# Patient Record
Sex: Male | Born: 1945 | Race: Black or African American | Hispanic: No | Marital: Married | Smoking: Never smoker
Health system: Southern US, Community
[De-identification: ages and names within clinical notes are randomized; demographics above are authoritative.]

## PROBLEM LIST (undated history)

## (undated) DIAGNOSIS — M75100 Unspecified rotator cuff tear or rupture of unspecified shoulder, not specified as traumatic: Secondary | ICD-10-CM

## (undated) DIAGNOSIS — Z951 Presence of aortocoronary bypass graft: Secondary | ICD-10-CM

## (undated) DIAGNOSIS — G629 Polyneuropathy, unspecified: Secondary | ICD-10-CM

## (undated) DIAGNOSIS — I209 Angina pectoris, unspecified: Secondary | ICD-10-CM

## (undated) DIAGNOSIS — D500 Iron deficiency anemia secondary to blood loss (chronic): Secondary | ICD-10-CM

## (undated) DIAGNOSIS — G4733 Obstructive sleep apnea (adult) (pediatric): Secondary | ICD-10-CM

## (undated) DIAGNOSIS — E049 Nontoxic goiter, unspecified: Secondary | ICD-10-CM

## (undated) DIAGNOSIS — I10 Essential (primary) hypertension: Secondary | ICD-10-CM

## (undated) DIAGNOSIS — I2510 Atherosclerotic heart disease of native coronary artery without angina pectoris: Secondary | ICD-10-CM

## (undated) DIAGNOSIS — K219 Gastro-esophageal reflux disease without esophagitis: Secondary | ICD-10-CM

## (undated) DIAGNOSIS — M25512 Pain in left shoulder: Secondary | ICD-10-CM

## (undated) DIAGNOSIS — Z9889 Other specified postprocedural states: Secondary | ICD-10-CM

## (undated) DIAGNOSIS — E785 Hyperlipidemia, unspecified: Secondary | ICD-10-CM

## (undated) DIAGNOSIS — I219 Acute myocardial infarction, unspecified: Secondary | ICD-10-CM

## (undated) DIAGNOSIS — N529 Male erectile dysfunction, unspecified: Secondary | ICD-10-CM

## (undated) DIAGNOSIS — M1990 Unspecified osteoarthritis, unspecified site: Secondary | ICD-10-CM

## (undated) DIAGNOSIS — H5440 Blindness, one eye, unspecified eye: Secondary | ICD-10-CM

## (undated) HISTORY — DX: Gastro-esophageal reflux disease without esophagitis: K21.9

## (undated) HISTORY — DX: Iron deficiency anemia secondary to blood loss (chronic): D50.0

## (undated) HISTORY — DX: Pain in left shoulder: M25.512

## (undated) HISTORY — DX: Male erectile dysfunction, unspecified: N52.9

## (undated) HISTORY — PX: HERNIA REPAIR: SHX51

## (undated) HISTORY — DX: Hyperlipidemia, unspecified: E78.5

## (undated) HISTORY — PX: PARTIAL HIP ARTHROPLASTY: SHX733

## (undated) HISTORY — DX: Atherosclerotic heart disease of native coronary artery without angina pectoris: I25.10

## (undated) HISTORY — DX: Nontoxic goiter, unspecified: E04.9

## (undated) HISTORY — DX: Obstructive sleep apnea (adult) (pediatric): G47.33

## (undated) HISTORY — DX: Essential (primary) hypertension: I10

## (undated) HISTORY — DX: Unspecified rotator cuff tear or rupture of unspecified shoulder, not specified as traumatic: M75.100

## (undated) HISTORY — DX: Unspecified osteoarthritis, unspecified site: M19.90

---

## 1997-01-13 DIAGNOSIS — I219 Acute myocardial infarction, unspecified: Secondary | ICD-10-CM

## 1997-01-13 HISTORY — PX: CORONARY ARTERY BYPASS GRAFT: SHX141

## 1997-01-13 HISTORY — DX: Acute myocardial infarction, unspecified: I21.9

## 2002-03-07 ENCOUNTER — Encounter: Payer: Self-pay | Admitting: Emergency Medicine

## 2002-03-07 ENCOUNTER — Inpatient Hospital Stay (HOSPITAL_COMMUNITY): Admission: EM | Admit: 2002-03-07 | Discharge: 2002-03-09 | Payer: Self-pay | Admitting: Emergency Medicine

## 2002-03-08 ENCOUNTER — Encounter: Payer: Self-pay | Admitting: Cardiology

## 2004-02-08 ENCOUNTER — Ambulatory Visit: Payer: Self-pay | Admitting: Endocrinology

## 2004-02-08 ENCOUNTER — Observation Stay (HOSPITAL_COMMUNITY): Admission: EM | Admit: 2004-02-08 | Discharge: 2004-02-09 | Payer: Self-pay | Admitting: Emergency Medicine

## 2004-02-08 ENCOUNTER — Ambulatory Visit: Payer: Self-pay | Admitting: Cardiology

## 2004-02-20 ENCOUNTER — Ambulatory Visit: Payer: Self-pay

## 2004-02-27 ENCOUNTER — Ambulatory Visit: Payer: Self-pay

## 2004-03-12 ENCOUNTER — Ambulatory Visit: Payer: Self-pay | Admitting: Cardiology

## 2004-03-13 ENCOUNTER — Ambulatory Visit (HOSPITAL_COMMUNITY): Admission: RE | Admit: 2004-03-13 | Discharge: 2004-03-13 | Payer: Self-pay | Admitting: Cardiology

## 2004-03-27 ENCOUNTER — Ambulatory Visit: Payer: Self-pay | Admitting: Endocrinology

## 2004-04-03 ENCOUNTER — Ambulatory Visit: Payer: Self-pay | Admitting: Endocrinology

## 2004-04-03 ENCOUNTER — Ambulatory Visit: Payer: Self-pay | Admitting: Critical Care Medicine

## 2004-04-16 ENCOUNTER — Other Ambulatory Visit: Admission: RE | Admit: 2004-04-16 | Discharge: 2004-04-16 | Payer: Self-pay | Admitting: Interventional Radiology

## 2004-04-16 ENCOUNTER — Encounter (INDEPENDENT_AMBULATORY_CARE_PROVIDER_SITE_OTHER): Payer: Self-pay | Admitting: *Deleted

## 2004-04-16 ENCOUNTER — Encounter: Admission: RE | Admit: 2004-04-16 | Discharge: 2004-04-16 | Payer: Self-pay | Admitting: Endocrinology

## 2004-04-17 ENCOUNTER — Ambulatory Visit: Payer: Self-pay | Admitting: Endocrinology

## 2004-04-30 ENCOUNTER — Ambulatory Visit (HOSPITAL_BASED_OUTPATIENT_CLINIC_OR_DEPARTMENT_OTHER): Admission: RE | Admit: 2004-04-30 | Discharge: 2004-04-30 | Payer: Self-pay | Admitting: Endocrinology

## 2004-05-06 ENCOUNTER — Ambulatory Visit: Payer: Self-pay | Admitting: Cardiology

## 2004-05-17 ENCOUNTER — Ambulatory Visit: Payer: Self-pay | Admitting: Pulmonary Disease

## 2004-05-22 ENCOUNTER — Emergency Department (HOSPITAL_COMMUNITY): Admission: EM | Admit: 2004-05-22 | Discharge: 2004-05-22 | Payer: Self-pay | Admitting: Emergency Medicine

## 2004-05-29 ENCOUNTER — Encounter (INDEPENDENT_AMBULATORY_CARE_PROVIDER_SITE_OTHER): Payer: Self-pay | Admitting: Specialist

## 2004-05-29 ENCOUNTER — Ambulatory Visit (HOSPITAL_COMMUNITY): Admission: RE | Admit: 2004-05-29 | Discharge: 2004-05-31 | Payer: Self-pay | Admitting: Otolaryngology

## 2004-06-26 ENCOUNTER — Ambulatory Visit: Payer: Self-pay | Admitting: Pulmonary Disease

## 2004-08-16 ENCOUNTER — Ambulatory Visit: Payer: Self-pay | Admitting: Internal Medicine

## 2004-08-26 ENCOUNTER — Ambulatory Visit: Payer: Self-pay | Admitting: Internal Medicine

## 2004-09-09 ENCOUNTER — Ambulatory Visit: Payer: Self-pay | Admitting: Internal Medicine

## 2004-09-25 ENCOUNTER — Ambulatory Visit: Payer: Self-pay | Admitting: Internal Medicine

## 2004-09-25 ENCOUNTER — Inpatient Hospital Stay (HOSPITAL_COMMUNITY): Admission: AD | Admit: 2004-09-25 | Discharge: 2004-09-28 | Payer: Self-pay | Admitting: Internal Medicine

## 2004-09-27 ENCOUNTER — Ambulatory Visit: Payer: Self-pay | Admitting: Cardiology

## 2004-10-02 ENCOUNTER — Ambulatory Visit: Payer: Self-pay | Admitting: Internal Medicine

## 2004-10-14 ENCOUNTER — Ambulatory Visit (HOSPITAL_BASED_OUTPATIENT_CLINIC_OR_DEPARTMENT_OTHER): Admission: RE | Admit: 2004-10-14 | Discharge: 2004-10-14 | Payer: Self-pay | Admitting: Internal Medicine

## 2004-10-14 ENCOUNTER — Ambulatory Visit: Payer: Self-pay | Admitting: Cardiology

## 2004-10-16 ENCOUNTER — Ambulatory Visit: Payer: Self-pay | Admitting: Internal Medicine

## 2004-11-15 ENCOUNTER — Ambulatory Visit: Payer: Self-pay | Admitting: Internal Medicine

## 2004-11-27 ENCOUNTER — Ambulatory Visit: Payer: Self-pay | Admitting: Cardiology

## 2004-11-27 ENCOUNTER — Inpatient Hospital Stay (HOSPITAL_COMMUNITY): Admission: EM | Admit: 2004-11-27 | Discharge: 2004-11-28 | Payer: Self-pay | Admitting: Emergency Medicine

## 2005-01-15 ENCOUNTER — Ambulatory Visit: Payer: Self-pay | Admitting: Cardiology

## 2005-01-15 ENCOUNTER — Ambulatory Visit (HOSPITAL_COMMUNITY): Admission: EM | Admit: 2005-01-15 | Discharge: 2005-01-16 | Payer: Self-pay | Admitting: Emergency Medicine

## 2005-01-28 ENCOUNTER — Ambulatory Visit: Payer: Self-pay | Admitting: Cardiology

## 2005-02-12 ENCOUNTER — Ambulatory Visit: Payer: Self-pay

## 2005-02-25 ENCOUNTER — Ambulatory Visit: Payer: Self-pay | Admitting: Internal Medicine

## 2005-03-12 ENCOUNTER — Ambulatory Visit: Payer: Self-pay

## 2005-03-19 ENCOUNTER — Ambulatory Visit: Payer: Self-pay | Admitting: Internal Medicine

## 2005-03-25 ENCOUNTER — Ambulatory Visit: Payer: Self-pay | Admitting: Cardiology

## 2005-03-27 ENCOUNTER — Ambulatory Visit: Payer: Self-pay

## 2005-04-07 ENCOUNTER — Ambulatory Visit: Payer: Self-pay | Admitting: Internal Medicine

## 2005-04-09 ENCOUNTER — Ambulatory Visit: Payer: Self-pay | Admitting: Cardiology

## 2005-04-09 ENCOUNTER — Ambulatory Visit (HOSPITAL_COMMUNITY): Admission: RE | Admit: 2005-04-09 | Discharge: 2005-04-09 | Payer: Self-pay | Admitting: Cardiology

## 2005-04-21 ENCOUNTER — Ambulatory Visit: Payer: Self-pay | Admitting: Internal Medicine

## 2005-05-08 ENCOUNTER — Ambulatory Visit: Payer: Self-pay | Admitting: Internal Medicine

## 2005-05-15 ENCOUNTER — Ambulatory Visit: Payer: Self-pay | Admitting: Cardiology

## 2005-05-30 ENCOUNTER — Ambulatory Visit: Payer: Self-pay | Admitting: Internal Medicine

## 2005-06-13 ENCOUNTER — Ambulatory Visit: Payer: Self-pay | Admitting: Internal Medicine

## 2005-07-03 ENCOUNTER — Ambulatory Visit: Payer: Self-pay | Admitting: Internal Medicine

## 2005-07-10 ENCOUNTER — Ambulatory Visit: Payer: Self-pay | Admitting: Internal Medicine

## 2005-07-10 ENCOUNTER — Ambulatory Visit (HOSPITAL_COMMUNITY): Admission: RE | Admit: 2005-07-10 | Discharge: 2005-07-10 | Payer: Self-pay | Admitting: *Deleted

## 2005-07-21 ENCOUNTER — Ambulatory Visit: Payer: Self-pay | Admitting: Hospitalist

## 2005-09-01 ENCOUNTER — Ambulatory Visit: Payer: Self-pay | Admitting: Internal Medicine

## 2005-10-01 ENCOUNTER — Ambulatory Visit: Payer: Self-pay | Admitting: Hospitalist

## 2005-11-07 ENCOUNTER — Ambulatory Visit (HOSPITAL_COMMUNITY): Admission: RE | Admit: 2005-11-07 | Discharge: 2005-11-07 | Payer: Self-pay | Admitting: Internal Medicine

## 2005-11-07 ENCOUNTER — Ambulatory Visit: Payer: Self-pay | Admitting: Internal Medicine

## 2005-11-07 ENCOUNTER — Encounter (INDEPENDENT_AMBULATORY_CARE_PROVIDER_SITE_OTHER): Payer: Self-pay | Admitting: Internal Medicine

## 2005-11-07 LAB — CONVERTED CEMR LAB
CK-MB: 1.8 ng/mL (ref 0.3–4.0)
CO2: 29 meq/L (ref 19–32)
Chloride: 95 meq/L — ABNORMAL LOW (ref 96–112)
Potassium: 3.1 meq/L — ABNORMAL LOW (ref 3.5–5.3)
Relative Index: 1.1 (ref 0.0–2.5)
Sodium: 136 meq/L (ref 135–145)

## 2005-11-14 ENCOUNTER — Ambulatory Visit: Payer: Self-pay | Admitting: Cardiology

## 2005-12-18 ENCOUNTER — Ambulatory Visit: Payer: Self-pay | Admitting: Internal Medicine

## 2005-12-19 ENCOUNTER — Encounter (INDEPENDENT_AMBULATORY_CARE_PROVIDER_SITE_OTHER): Payer: Self-pay | Admitting: Internal Medicine

## 2005-12-19 ENCOUNTER — Ambulatory Visit: Payer: Self-pay | Admitting: Internal Medicine

## 2005-12-19 LAB — CONVERTED CEMR LAB
BUN: 9 mg/dL (ref 6–23)
CO2: 28 meq/L (ref 19–32)
Chloride: 100 meq/L (ref 96–112)
Glucose, Bld: 105 mg/dL — ABNORMAL HIGH (ref 70–99)
LDL Cholesterol: 84 mg/dL (ref 0–99)
Potassium: 3.8 meq/L (ref 3.5–5.3)
Sodium: 138 meq/L (ref 135–145)
Total CHOL/HDL Ratio: 3.6
VLDL: 17 mg/dL (ref 0–40)

## 2005-12-23 ENCOUNTER — Ambulatory Visit: Payer: Self-pay | Admitting: Internal Medicine

## 2006-01-22 DIAGNOSIS — H269 Unspecified cataract: Secondary | ICD-10-CM

## 2006-01-22 DIAGNOSIS — I209 Angina pectoris, unspecified: Secondary | ICD-10-CM

## 2006-01-22 DIAGNOSIS — M51379 Other intervertebral disc degeneration, lumbosacral region without mention of lumbar back pain or lower extremity pain: Secondary | ICD-10-CM | POA: Insufficient documentation

## 2006-01-22 DIAGNOSIS — M5137 Other intervertebral disc degeneration, lumbosacral region: Secondary | ICD-10-CM

## 2006-01-22 DIAGNOSIS — G4733 Obstructive sleep apnea (adult) (pediatric): Secondary | ICD-10-CM | POA: Insufficient documentation

## 2006-01-22 DIAGNOSIS — I739 Peripheral vascular disease, unspecified: Secondary | ICD-10-CM

## 2006-01-22 DIAGNOSIS — M48061 Spinal stenosis, lumbar region without neurogenic claudication: Secondary | ICD-10-CM

## 2006-01-22 DIAGNOSIS — M19049 Primary osteoarthritis, unspecified hand: Secondary | ICD-10-CM | POA: Insufficient documentation

## 2006-01-23 ENCOUNTER — Ambulatory Visit: Payer: Self-pay | Admitting: Hospitalist

## 2006-01-23 ENCOUNTER — Encounter (INDEPENDENT_AMBULATORY_CARE_PROVIDER_SITE_OTHER): Payer: Self-pay | Admitting: Internal Medicine

## 2006-01-23 DIAGNOSIS — Z9089 Acquired absence of other organs: Secondary | ICD-10-CM | POA: Insufficient documentation

## 2006-01-23 DIAGNOSIS — M79609 Pain in unspecified limb: Secondary | ICD-10-CM

## 2006-01-23 LAB — CONVERTED CEMR LAB
BUN: 16 mg/dL (ref 6–23)
Chloride: 100 meq/L (ref 96–112)
Creatinine, Ser: 1.12 mg/dL (ref 0.40–1.50)
Potassium: 3.5 meq/L (ref 3.5–5.3)

## 2006-04-13 ENCOUNTER — Inpatient Hospital Stay (HOSPITAL_COMMUNITY): Admission: EM | Admit: 2006-04-13 | Discharge: 2006-04-14 | Payer: Self-pay | Admitting: Emergency Medicine

## 2006-04-13 ENCOUNTER — Ambulatory Visit: Payer: Self-pay | Admitting: Cardiology

## 2006-04-16 ENCOUNTER — Telehealth: Payer: Self-pay | Admitting: *Deleted

## 2006-04-21 ENCOUNTER — Ambulatory Visit: Payer: Self-pay | Admitting: Cardiology

## 2006-04-21 LAB — CONVERTED CEMR LAB
ALT: 19 units/L (ref 0–40)
AST: 18 units/L (ref 0–37)
Bilirubin, Direct: 0.1 mg/dL (ref 0.0–0.3)
Cholesterol: 159 mg/dL (ref 0–200)
HDL: 40.4 mg/dL (ref >39.0)
LDL Cholesterol: 101 mg/dL — ABNORMAL HIGH (ref 0–99)
Total Protein: 7.5 g/dL (ref 6.0–8.3)
Triglycerides: 89 mg/dL (ref 0–149)
VLDL: 18 mg/dL (ref 0–40)

## 2006-04-22 ENCOUNTER — Encounter (INDEPENDENT_AMBULATORY_CARE_PROVIDER_SITE_OTHER): Payer: Self-pay | Admitting: Internal Medicine

## 2006-04-22 ENCOUNTER — Ambulatory Visit: Payer: Self-pay | Admitting: Internal Medicine

## 2006-04-22 LAB — CONVERTED CEMR LAB
CO2: 28 meq/L (ref 19–32)
Calcium: 8.6 mg/dL (ref 8.4–10.5)
Creatinine, Ser: 1.01 mg/dL (ref 0.40–1.50)
Sodium: 136 meq/L (ref 135–145)

## 2006-05-08 ENCOUNTER — Encounter (INDEPENDENT_AMBULATORY_CARE_PROVIDER_SITE_OTHER): Payer: Self-pay | Admitting: Internal Medicine

## 2006-05-08 ENCOUNTER — Ambulatory Visit: Payer: Self-pay | Admitting: Cardiology

## 2006-05-27 ENCOUNTER — Encounter (INDEPENDENT_AMBULATORY_CARE_PROVIDER_SITE_OTHER): Payer: Self-pay | Admitting: Internal Medicine

## 2006-05-27 ENCOUNTER — Ambulatory Visit: Payer: Self-pay | Admitting: Internal Medicine

## 2006-05-27 ENCOUNTER — Ambulatory Visit (HOSPITAL_COMMUNITY): Admission: RE | Admit: 2006-05-27 | Discharge: 2006-05-27 | Payer: Self-pay | Admitting: Internal Medicine

## 2006-05-27 DIAGNOSIS — E785 Hyperlipidemia, unspecified: Secondary | ICD-10-CM

## 2006-05-27 DIAGNOSIS — I10 Essential (primary) hypertension: Secondary | ICD-10-CM | POA: Insufficient documentation

## 2006-06-22 ENCOUNTER — Telehealth: Payer: Self-pay | Admitting: *Deleted

## 2006-07-02 ENCOUNTER — Encounter (INDEPENDENT_AMBULATORY_CARE_PROVIDER_SITE_OTHER): Payer: Self-pay | Admitting: Internal Medicine

## 2006-07-02 ENCOUNTER — Ambulatory Visit: Payer: Self-pay | Admitting: Internal Medicine

## 2006-07-02 LAB — CONVERTED CEMR LAB
AST: 16 units/L (ref 0–37)
Albumin: 4 g/dL (ref 3.5–5.2)
BUN: 13 mg/dL (ref 6–23)
CO2: 30 meq/L (ref 19–32)
Calcium: 8.6 mg/dL (ref 8.4–10.5)
Chloride: 101 meq/L (ref 96–112)
Creatinine, Ser: 1.02 mg/dL (ref 0.40–1.50)
Glucose, Bld: 92 mg/dL (ref 70–99)
Potassium: 3.6 meq/L (ref 3.5–5.3)

## 2006-07-20 ENCOUNTER — Telehealth (INDEPENDENT_AMBULATORY_CARE_PROVIDER_SITE_OTHER): Payer: Self-pay | Admitting: *Deleted

## 2006-09-02 ENCOUNTER — Telehealth: Payer: Self-pay | Admitting: *Deleted

## 2006-09-07 ENCOUNTER — Encounter (INDEPENDENT_AMBULATORY_CARE_PROVIDER_SITE_OTHER): Payer: Self-pay | Admitting: Internal Medicine

## 2006-09-07 ENCOUNTER — Ambulatory Visit: Payer: Self-pay | Admitting: Internal Medicine

## 2006-09-07 LAB — CONVERTED CEMR LAB
ALT: 20 units/L (ref 0–53)
Basophils Absolute: 0 10*3/uL (ref 0.0–0.1)
CO2: 24 meq/L (ref 19–32)
Calcium: 9.1 mg/dL (ref 8.4–10.5)
Chloride: 100 meq/L (ref 96–112)
Cholesterol: 179 mg/dL (ref 0–200)
Creatinine, Ser: 1.05 mg/dL (ref 0.40–1.50)
Glucose, Bld: 90 mg/dL (ref 70–99)
Hemoglobin: 15.4 g/dL (ref 13.0–17.0)
Lymphocytes Relative: 45 % (ref 12–46)
Lymphs Abs: 1.9 10*3/uL (ref 0.7–3.3)
Monocytes Absolute: 0.4 10*3/uL (ref 0.2–0.7)
Neutro Abs: 1.9 10*3/uL (ref 1.7–7.7)
RBC: 5.49 M/uL (ref 4.22–5.81)
RDW: 14.1 % — ABNORMAL HIGH (ref 11.5–14.0)
Total Protein: 7.9 g/dL (ref 6.0–8.3)
WBC: 4.4 10*3/uL (ref 4.0–10.5)

## 2006-09-10 DIAGNOSIS — K219 Gastro-esophageal reflux disease without esophagitis: Secondary | ICD-10-CM

## 2006-09-25 ENCOUNTER — Ambulatory Visit (HOSPITAL_COMMUNITY): Admission: RE | Admit: 2006-09-25 | Discharge: 2006-09-25 | Payer: Self-pay | Admitting: Internal Medicine

## 2006-09-25 ENCOUNTER — Encounter (INDEPENDENT_AMBULATORY_CARE_PROVIDER_SITE_OTHER): Payer: Self-pay | Admitting: Internal Medicine

## 2006-09-25 ENCOUNTER — Ambulatory Visit: Payer: Self-pay | Admitting: Vascular Surgery

## 2006-10-02 ENCOUNTER — Telehealth: Payer: Self-pay | Admitting: *Deleted

## 2006-10-09 ENCOUNTER — Ambulatory Visit: Payer: Self-pay | Admitting: Internal Medicine

## 2006-10-13 ENCOUNTER — Encounter (INDEPENDENT_AMBULATORY_CARE_PROVIDER_SITE_OTHER): Payer: Self-pay | Admitting: Internal Medicine

## 2006-10-15 ENCOUNTER — Telehealth: Payer: Self-pay | Admitting: *Deleted

## 2006-10-27 ENCOUNTER — Encounter (HOSPITAL_COMMUNITY): Admission: RE | Admit: 2006-10-27 | Discharge: 2007-01-12 | Payer: Self-pay | Admitting: Cardiology

## 2006-10-29 ENCOUNTER — Ambulatory Visit: Payer: Self-pay | Admitting: Cardiology

## 2006-10-29 ENCOUNTER — Encounter (INDEPENDENT_AMBULATORY_CARE_PROVIDER_SITE_OTHER): Payer: Self-pay | Admitting: Internal Medicine

## 2006-10-30 ENCOUNTER — Encounter (INDEPENDENT_AMBULATORY_CARE_PROVIDER_SITE_OTHER): Payer: Self-pay | Admitting: Internal Medicine

## 2006-11-05 ENCOUNTER — Ambulatory Visit (HOSPITAL_COMMUNITY): Admission: RE | Admit: 2006-11-05 | Discharge: 2006-11-05 | Payer: Self-pay | Admitting: Gastroenterology

## 2006-11-05 ENCOUNTER — Encounter (INDEPENDENT_AMBULATORY_CARE_PROVIDER_SITE_OTHER): Payer: Self-pay | Admitting: Gastroenterology

## 2006-11-05 LAB — HM COLONOSCOPY

## 2006-11-17 ENCOUNTER — Telehealth (INDEPENDENT_AMBULATORY_CARE_PROVIDER_SITE_OTHER): Payer: Self-pay | Admitting: Internal Medicine

## 2006-11-25 ENCOUNTER — Ambulatory Visit (HOSPITAL_COMMUNITY): Admission: RE | Admit: 2006-11-25 | Discharge: 2006-11-25 | Payer: Self-pay | Admitting: *Deleted

## 2006-11-25 ENCOUNTER — Encounter (INDEPENDENT_AMBULATORY_CARE_PROVIDER_SITE_OTHER): Payer: Self-pay | Admitting: *Deleted

## 2006-11-25 ENCOUNTER — Encounter (INDEPENDENT_AMBULATORY_CARE_PROVIDER_SITE_OTHER): Payer: Self-pay | Admitting: Internal Medicine

## 2006-11-25 ENCOUNTER — Ambulatory Visit: Payer: Self-pay | Admitting: *Deleted

## 2006-11-25 LAB — CONVERTED CEMR LAB
Albumin: 3.5 g/dL (ref 3.5–5.2)
CK-MB: 2.1 ng/mL (ref 0.3–4.0)
CO2: 30 meq/L (ref 19–32)
Chloride: 103 meq/L (ref 96–112)
Glucose, Bld: 100 mg/dL — ABNORMAL HIGH (ref 70–99)
Potassium: 4.2 meq/L (ref 3.5–5.3)
RBC: 5.1 M/uL (ref 4.22–5.81)
Sodium: 138 meq/L (ref 135–145)
TSH: 1.26 microintl units/mL (ref 0.350–5.50)
Total CK: 194 units/L (ref 7–232)
Total Protein: 7.7 g/dL (ref 6.0–8.3)
Troponin I: 0.01 ng/mL (ref <0.06)
WBC: 4.2 10*3/uL (ref 4.0–10.5)

## 2006-11-26 ENCOUNTER — Ambulatory Visit (HOSPITAL_COMMUNITY): Admission: RE | Admit: 2006-11-26 | Discharge: 2006-11-26 | Payer: Self-pay | Admitting: *Deleted

## 2006-11-27 ENCOUNTER — Emergency Department (HOSPITAL_COMMUNITY): Admission: EM | Admit: 2006-11-27 | Discharge: 2006-11-27 | Payer: Self-pay | Admitting: Emergency Medicine

## 2006-12-01 ENCOUNTER — Telehealth (INDEPENDENT_AMBULATORY_CARE_PROVIDER_SITE_OTHER): Payer: Self-pay | Admitting: Internal Medicine

## 2006-12-04 ENCOUNTER — Telehealth (INDEPENDENT_AMBULATORY_CARE_PROVIDER_SITE_OTHER): Payer: Self-pay | Admitting: Internal Medicine

## 2006-12-23 ENCOUNTER — Telehealth: Payer: Self-pay | Admitting: *Deleted

## 2007-01-14 ENCOUNTER — Encounter (HOSPITAL_COMMUNITY): Admission: RE | Admit: 2007-01-14 | Discharge: 2007-04-14 | Payer: Self-pay | Admitting: Cardiology

## 2007-01-19 ENCOUNTER — Telehealth: Payer: Self-pay | Admitting: *Deleted

## 2007-01-29 ENCOUNTER — Ambulatory Visit: Payer: Self-pay | Admitting: Internal Medicine

## 2007-01-29 ENCOUNTER — Encounter (INDEPENDENT_AMBULATORY_CARE_PROVIDER_SITE_OTHER): Payer: Self-pay | Admitting: Internal Medicine

## 2007-01-29 DIAGNOSIS — E669 Obesity, unspecified: Secondary | ICD-10-CM

## 2007-02-03 ENCOUNTER — Ambulatory Visit: Payer: Self-pay | Admitting: Internal Medicine

## 2007-02-05 LAB — CONVERTED CEMR LAB
BUN: 16 mg/dL (ref 6–23)
Calcium: 8.7 mg/dL (ref 8.4–10.5)
Cholesterol: 206 mg/dL — ABNORMAL HIGH (ref 0–200)
Glucose, Bld: 89 mg/dL (ref 70–99)
Potassium: 4 meq/L (ref 3.5–5.3)
VLDL: 27 mg/dL (ref 0–40)

## 2007-02-13 ENCOUNTER — Ambulatory Visit: Payer: Self-pay | Admitting: Internal Medicine

## 2007-02-13 ENCOUNTER — Inpatient Hospital Stay (HOSPITAL_COMMUNITY): Admission: EM | Admit: 2007-02-13 | Discharge: 2007-02-14 | Payer: Self-pay | Admitting: Emergency Medicine

## 2007-02-22 ENCOUNTER — Encounter (INDEPENDENT_AMBULATORY_CARE_PROVIDER_SITE_OTHER): Payer: Self-pay | Admitting: Internal Medicine

## 2007-02-22 ENCOUNTER — Ambulatory Visit: Payer: Self-pay

## 2007-03-02 ENCOUNTER — Ambulatory Visit: Payer: Self-pay | Admitting: Cardiology

## 2007-03-16 ENCOUNTER — Encounter (INDEPENDENT_AMBULATORY_CARE_PROVIDER_SITE_OTHER): Payer: Self-pay | Admitting: Internal Medicine

## 2007-04-15 ENCOUNTER — Encounter (HOSPITAL_COMMUNITY): Admission: RE | Admit: 2007-04-15 | Discharge: 2007-07-14 | Payer: Self-pay | Admitting: Cardiology

## 2007-05-13 ENCOUNTER — Encounter (INDEPENDENT_AMBULATORY_CARE_PROVIDER_SITE_OTHER): Payer: Self-pay | Admitting: Internal Medicine

## 2007-05-13 ENCOUNTER — Ambulatory Visit: Payer: Self-pay | Admitting: Infectious Disease

## 2007-05-13 DIAGNOSIS — L909 Atrophic disorder of skin, unspecified: Secondary | ICD-10-CM | POA: Insufficient documentation

## 2007-05-13 DIAGNOSIS — L919 Hypertrophic disorder of the skin, unspecified: Secondary | ICD-10-CM

## 2007-05-13 LAB — CONVERTED CEMR LAB
BUN: 15 mg/dL (ref 6–23)
Chloride: 102 meq/L (ref 96–112)
Potassium: 4 meq/L (ref 3.5–5.3)

## 2007-05-19 ENCOUNTER — Ambulatory Visit: Payer: Self-pay | Admitting: Infectious Disease

## 2007-05-19 ENCOUNTER — Encounter (INDEPENDENT_AMBULATORY_CARE_PROVIDER_SITE_OTHER): Payer: Self-pay | Admitting: Internal Medicine

## 2007-05-25 LAB — CONVERTED CEMR LAB
LDL Cholesterol: 125 mg/dL — ABNORMAL HIGH (ref 0–99)
Triglycerides: 88 mg/dL (ref <150)
VLDL: 18 mg/dL (ref 0–40)

## 2007-06-28 ENCOUNTER — Telehealth (INDEPENDENT_AMBULATORY_CARE_PROVIDER_SITE_OTHER): Payer: Self-pay | Admitting: Internal Medicine

## 2007-07-15 ENCOUNTER — Encounter (HOSPITAL_COMMUNITY): Admission: RE | Admit: 2007-07-15 | Discharge: 2007-10-13 | Payer: Self-pay | Admitting: Cardiology

## 2007-07-28 ENCOUNTER — Ambulatory Visit (HOSPITAL_COMMUNITY): Admission: RE | Admit: 2007-07-28 | Discharge: 2007-07-28 | Payer: Self-pay | Admitting: *Deleted

## 2007-07-28 ENCOUNTER — Ambulatory Visit: Payer: Self-pay | Admitting: *Deleted

## 2007-07-28 ENCOUNTER — Encounter (INDEPENDENT_AMBULATORY_CARE_PROVIDER_SITE_OTHER): Payer: Self-pay | Admitting: *Deleted

## 2007-07-28 DIAGNOSIS — R209 Unspecified disturbances of skin sensation: Secondary | ICD-10-CM | POA: Insufficient documentation

## 2007-07-28 DIAGNOSIS — M7582 Other shoulder lesions, left shoulder: Secondary | ICD-10-CM | POA: Insufficient documentation

## 2007-07-28 DIAGNOSIS — M25519 Pain in unspecified shoulder: Secondary | ICD-10-CM

## 2007-07-28 DIAGNOSIS — M75100 Unspecified rotator cuff tear or rupture of unspecified shoulder, not specified as traumatic: Secondary | ICD-10-CM | POA: Insufficient documentation

## 2007-07-29 DIAGNOSIS — E739 Lactose intolerance, unspecified: Secondary | ICD-10-CM | POA: Insufficient documentation

## 2007-07-29 LAB — CONVERTED CEMR LAB
CO2: 25 meq/L (ref 19–32)
Calcium: 8.7 mg/dL (ref 8.4–10.5)
MCHC: 32.7 g/dL (ref 30.0–36.0)
MCV: 84 fL (ref 78.0–100.0)
Platelets: 159 10*3/uL (ref 150–400)
RDW: 14.7 % (ref 11.5–15.5)
Sodium: 138 meq/L (ref 135–145)
TSH: 1.228 microintl units/mL (ref 0.350–4.50)
Vitamin B-12: 426 pg/mL (ref 211–911)
WBC: 3.9 10*3/uL — ABNORMAL LOW (ref 4.0–10.5)

## 2007-08-14 HISTORY — PX: EYE SURGERY: SHX253

## 2007-09-02 ENCOUNTER — Encounter (INDEPENDENT_AMBULATORY_CARE_PROVIDER_SITE_OTHER): Payer: Self-pay | Admitting: Internal Medicine

## 2007-09-02 ENCOUNTER — Ambulatory Visit: Payer: Self-pay | Admitting: *Deleted

## 2007-09-02 DIAGNOSIS — F528 Other sexual dysfunction not due to a substance or known physiological condition: Secondary | ICD-10-CM | POA: Insufficient documentation

## 2007-09-02 DIAGNOSIS — M4802 Spinal stenosis, cervical region: Secondary | ICD-10-CM | POA: Insufficient documentation

## 2007-09-02 DIAGNOSIS — K029 Dental caries, unspecified: Secondary | ICD-10-CM | POA: Insufficient documentation

## 2007-09-03 LAB — CONVERTED CEMR LAB
Calcium: 8.9 mg/dL (ref 8.4–10.5)
Cholesterol: 200 mg/dL (ref 0–200)
HDL: 41 mg/dL (ref >39)
Sodium: 137 meq/L (ref 135–145)
Total CHOL/HDL Ratio: 4.9

## 2007-09-06 ENCOUNTER — Encounter (INDEPENDENT_AMBULATORY_CARE_PROVIDER_SITE_OTHER): Payer: Self-pay | Admitting: Internal Medicine

## 2007-09-06 ENCOUNTER — Ambulatory Visit (HOSPITAL_COMMUNITY): Admission: RE | Admit: 2007-09-06 | Discharge: 2007-09-06 | Payer: Self-pay | Admitting: Internal Medicine

## 2007-09-22 ENCOUNTER — Ambulatory Visit: Payer: Self-pay | Admitting: Cardiology

## 2007-10-14 ENCOUNTER — Inpatient Hospital Stay (HOSPITAL_COMMUNITY): Admission: RE | Admit: 2007-10-14 | Discharge: 2007-10-23 | Payer: Self-pay | Admitting: Neurosurgery

## 2007-10-14 ENCOUNTER — Ambulatory Visit: Payer: Self-pay | Admitting: Pulmonary Disease

## 2007-10-14 ENCOUNTER — Encounter (HOSPITAL_COMMUNITY): Admission: RE | Admit: 2007-10-14 | Discharge: 2008-01-12 | Payer: Self-pay | Admitting: Cardiology

## 2007-10-14 HISTORY — PX: SPINE SURGERY: SHX786

## 2007-11-09 ENCOUNTER — Encounter: Admission: RE | Admit: 2007-11-09 | Discharge: 2007-11-09 | Payer: Self-pay | Admitting: Neurosurgery

## 2007-11-12 ENCOUNTER — Encounter (INDEPENDENT_AMBULATORY_CARE_PROVIDER_SITE_OTHER): Payer: Self-pay | Admitting: Internal Medicine

## 2007-12-27 ENCOUNTER — Telehealth (INDEPENDENT_AMBULATORY_CARE_PROVIDER_SITE_OTHER): Payer: Self-pay | Admitting: Internal Medicine

## 2008-01-14 ENCOUNTER — Encounter (HOSPITAL_COMMUNITY): Admission: RE | Admit: 2008-01-14 | Discharge: 2008-04-13 | Payer: Self-pay | Admitting: Cardiology

## 2008-01-27 ENCOUNTER — Encounter (INDEPENDENT_AMBULATORY_CARE_PROVIDER_SITE_OTHER): Payer: Self-pay | Admitting: Internal Medicine

## 2008-02-03 ENCOUNTER — Ambulatory Visit: Payer: Self-pay | Admitting: Internal Medicine

## 2008-02-04 ENCOUNTER — Encounter: Payer: Self-pay | Admitting: Licensed Clinical Social Worker

## 2008-02-04 ENCOUNTER — Encounter (INDEPENDENT_AMBULATORY_CARE_PROVIDER_SITE_OTHER): Payer: Self-pay | Admitting: Internal Medicine

## 2008-02-08 LAB — CONVERTED CEMR LAB
BUN: 16 mg/dL (ref 6–23)
CO2: 24 meq/L (ref 19–32)
Calcium: 8.4 mg/dL (ref 8.4–10.5)
Cholesterol: 164 mg/dL (ref 0–200)
Glucose, Bld: 80 mg/dL (ref 70–99)
TSH: 1.65 microintl units/mL (ref 0.350–4.50)
VLDL: 21 mg/dL (ref 0–40)

## 2008-04-05 ENCOUNTER — Encounter: Payer: Self-pay | Admitting: Cardiology

## 2008-04-05 ENCOUNTER — Ambulatory Visit: Payer: Self-pay | Admitting: Cardiology

## 2008-04-05 DIAGNOSIS — I25810 Atherosclerosis of coronary artery bypass graft(s) without angina pectoris: Secondary | ICD-10-CM | POA: Insufficient documentation

## 2008-04-05 DIAGNOSIS — E782 Mixed hyperlipidemia: Secondary | ICD-10-CM | POA: Insufficient documentation

## 2008-04-05 DIAGNOSIS — K279 Peptic ulcer, site unspecified, unspecified as acute or chronic, without hemorrhage or perforation: Secondary | ICD-10-CM | POA: Insufficient documentation

## 2008-04-14 ENCOUNTER — Encounter (HOSPITAL_COMMUNITY): Admission: RE | Admit: 2008-04-14 | Discharge: 2008-07-13 | Payer: Self-pay | Admitting: Cardiology

## 2008-04-27 ENCOUNTER — Ambulatory Visit: Payer: Self-pay | Admitting: Internal Medicine

## 2008-04-28 ENCOUNTER — Ambulatory Visit (HOSPITAL_COMMUNITY): Admission: RE | Admit: 2008-04-28 | Discharge: 2008-04-28 | Payer: Self-pay | Admitting: Internal Medicine

## 2008-05-16 ENCOUNTER — Telehealth (INDEPENDENT_AMBULATORY_CARE_PROVIDER_SITE_OTHER): Payer: Self-pay | Admitting: *Deleted

## 2008-07-14 ENCOUNTER — Encounter (HOSPITAL_COMMUNITY): Admission: RE | Admit: 2008-07-14 | Discharge: 2008-10-12 | Payer: Self-pay | Admitting: Cardiology

## 2008-08-11 ENCOUNTER — Encounter (INDEPENDENT_AMBULATORY_CARE_PROVIDER_SITE_OTHER): Payer: Self-pay | Admitting: *Deleted

## 2008-09-05 ENCOUNTER — Telehealth (INDEPENDENT_AMBULATORY_CARE_PROVIDER_SITE_OTHER): Payer: Self-pay | Admitting: *Deleted

## 2008-09-06 ENCOUNTER — Encounter: Payer: Self-pay | Admitting: Cardiology

## 2008-09-13 ENCOUNTER — Ambulatory Visit: Payer: Self-pay | Admitting: Cardiology

## 2008-09-29 ENCOUNTER — Ambulatory Visit: Payer: Self-pay | Admitting: Internal Medicine

## 2008-10-13 ENCOUNTER — Encounter (HOSPITAL_COMMUNITY): Admission: RE | Admit: 2008-10-13 | Discharge: 2009-01-11 | Payer: Self-pay | Admitting: Cardiology

## 2008-10-16 ENCOUNTER — Encounter: Payer: Self-pay | Admitting: Cardiology

## 2008-11-01 ENCOUNTER — Ambulatory Visit: Payer: Self-pay | Admitting: Internal Medicine

## 2008-12-06 ENCOUNTER — Telehealth: Payer: Self-pay | Admitting: *Deleted

## 2008-12-29 ENCOUNTER — Telehealth (INDEPENDENT_AMBULATORY_CARE_PROVIDER_SITE_OTHER): Payer: Self-pay | Admitting: *Deleted

## 2009-01-13 ENCOUNTER — Encounter (HOSPITAL_COMMUNITY): Admission: RE | Admit: 2009-01-13 | Discharge: 2009-03-28 | Payer: Self-pay | Admitting: Cardiology

## 2009-01-24 ENCOUNTER — Telehealth: Payer: Self-pay | Admitting: Internal Medicine

## 2009-01-30 ENCOUNTER — Telehealth: Payer: Self-pay | Admitting: Internal Medicine

## 2009-02-05 ENCOUNTER — Emergency Department (HOSPITAL_COMMUNITY): Admission: EM | Admit: 2009-02-05 | Discharge: 2009-02-05 | Payer: Self-pay | Admitting: Emergency Medicine

## 2009-02-05 ENCOUNTER — Telehealth: Payer: Self-pay | Admitting: Internal Medicine

## 2009-03-05 ENCOUNTER — Ambulatory Visit: Payer: Self-pay | Admitting: Internal Medicine

## 2009-03-05 DIAGNOSIS — M6740 Ganglion, unspecified site: Secondary | ICD-10-CM | POA: Insufficient documentation

## 2009-03-13 ENCOUNTER — Encounter: Payer: Self-pay | Admitting: Cardiology

## 2009-04-09 ENCOUNTER — Ambulatory Visit: Payer: Self-pay | Admitting: Infectious Diseases

## 2009-04-13 ENCOUNTER — Ambulatory Visit: Payer: Self-pay | Admitting: Family Medicine

## 2009-04-13 DIAGNOSIS — M654 Radial styloid tenosynovitis [de Quervain]: Secondary | ICD-10-CM | POA: Insufficient documentation

## 2009-05-23 ENCOUNTER — Telehealth (INDEPENDENT_AMBULATORY_CARE_PROVIDER_SITE_OTHER): Payer: Self-pay | Admitting: Internal Medicine

## 2009-09-25 ENCOUNTER — Telehealth: Payer: Self-pay | Admitting: Internal Medicine

## 2009-09-28 ENCOUNTER — Encounter: Payer: Self-pay | Admitting: Internal Medicine

## 2009-09-28 ENCOUNTER — Ambulatory Visit: Payer: Self-pay | Admitting: Cardiology

## 2009-10-08 ENCOUNTER — Ambulatory Visit: Payer: Self-pay | Admitting: Internal Medicine

## 2009-12-06 IMAGING — CR DG CHEST 2V
2 series · 2 of 2 positions shown · non-contrast
Comparison: 02/13/2007 study

CLINICAL DATA: History given of productive cough, chest pain,
shortness of breath, and sleep apnea and hypertension.

CHEST - 2 VIEW

[view not recorded (1 of 2)]
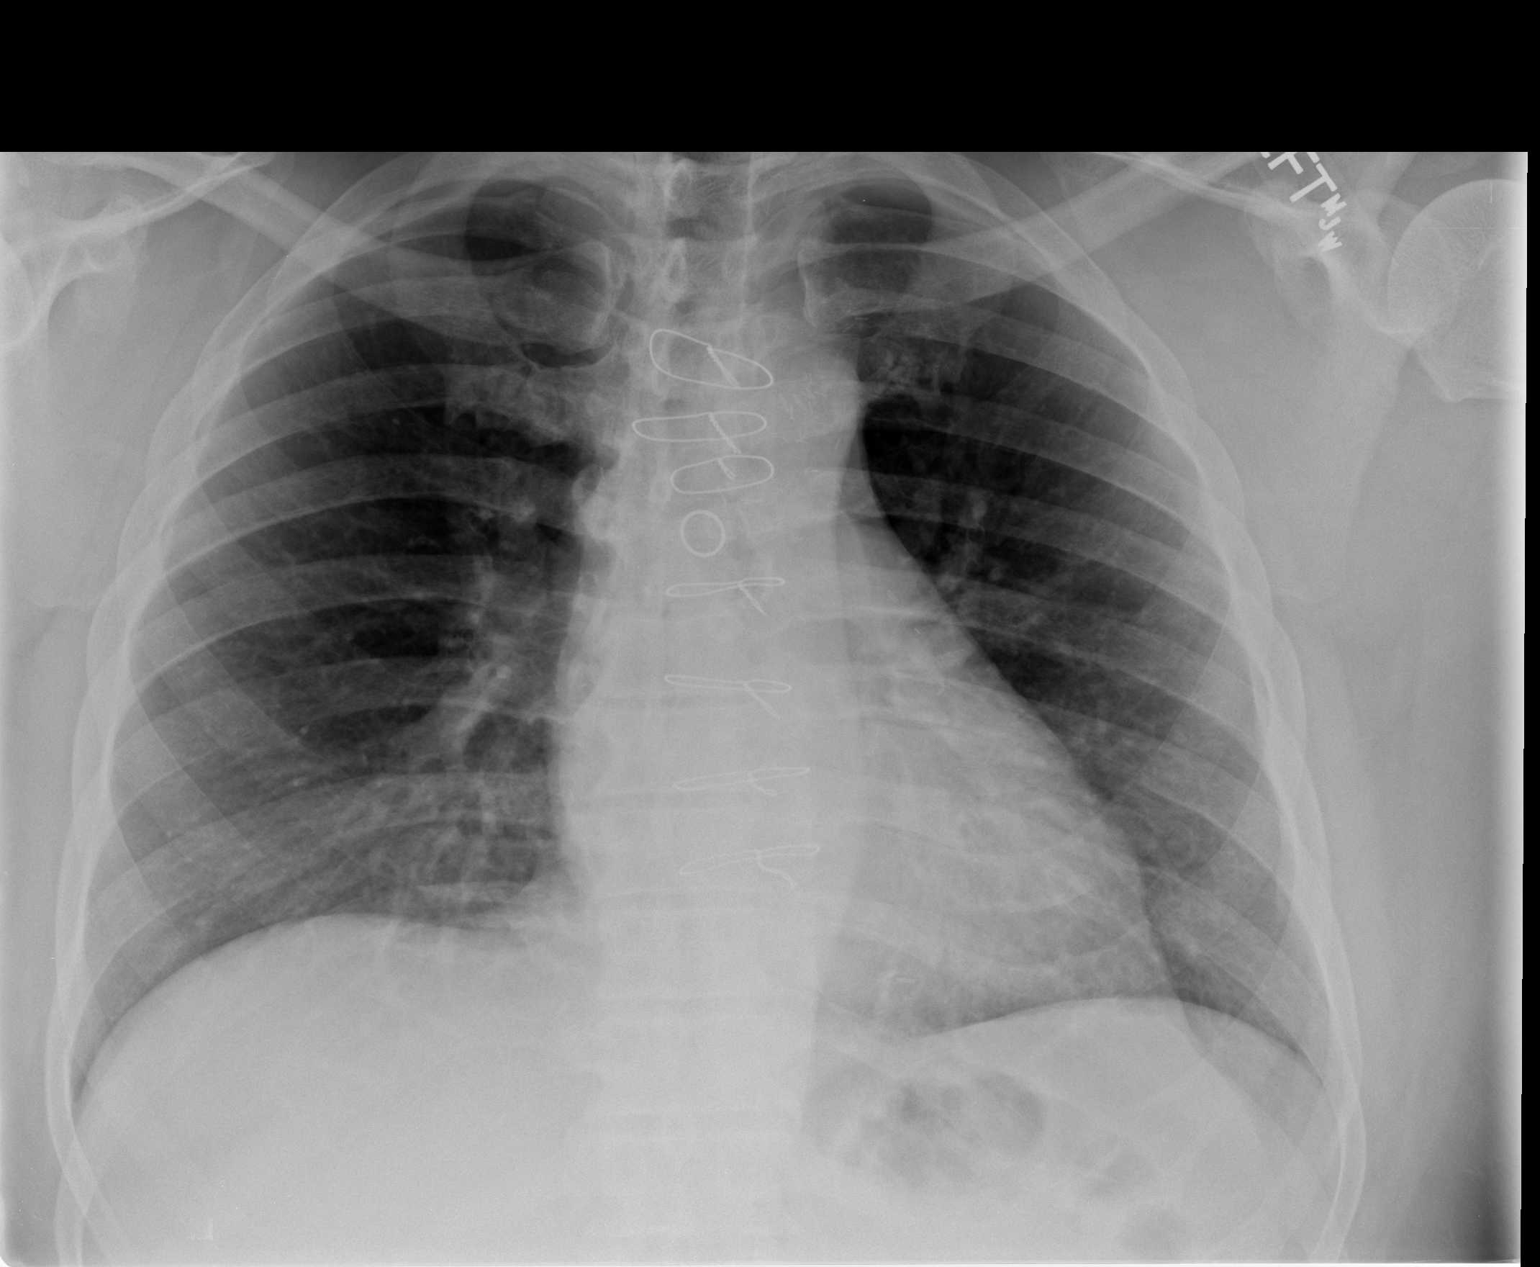

[view not recorded (2 of 2)]
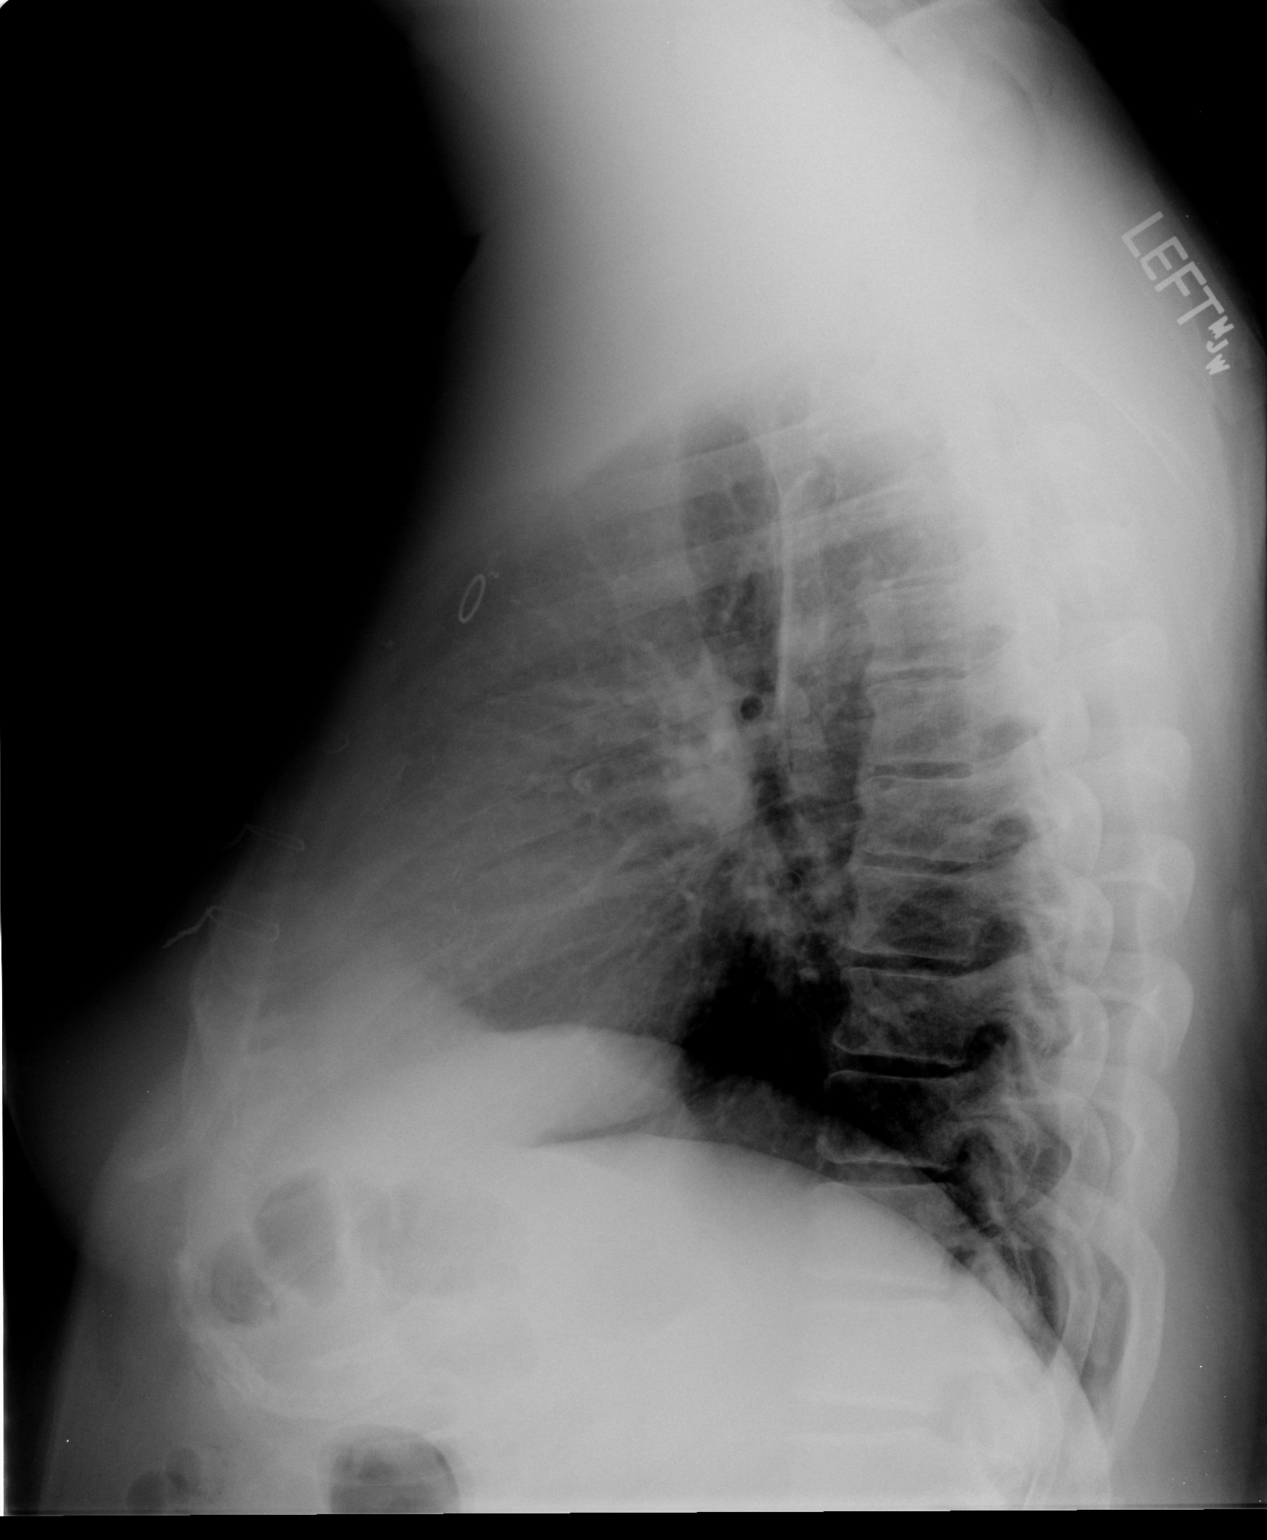

[2 of 2 positions shown; findings below may reference images not displayed]

FINDINGS: Previous median sternotomy and coronary artery bypass
grafting have been performed.  Cardiac silhouette is normal size
and shape.  Lungs are free of infiltrates.  No pleural disease is
evident.  There is coronary artery calcification.  There is mild
osteophyte formation in the spine.
IMPRESSION: No acute or active process seen.

## 2009-12-13 ENCOUNTER — Encounter: Payer: Self-pay | Admitting: *Deleted

## 2009-12-13 IMAGING — RF DG CERVICAL SPINE 2 OR 3 VIEWS
1 series · 2 of 2 positions shown · non-contrast
Comparison: MRI dated 09/06/2007

CLINICAL DATA: Cervical spinal stenosis.

CERVICAL SPINE - 2-3 VIEW

[Series 1: run · 2 of 2 slices shown]
[im 1/2]
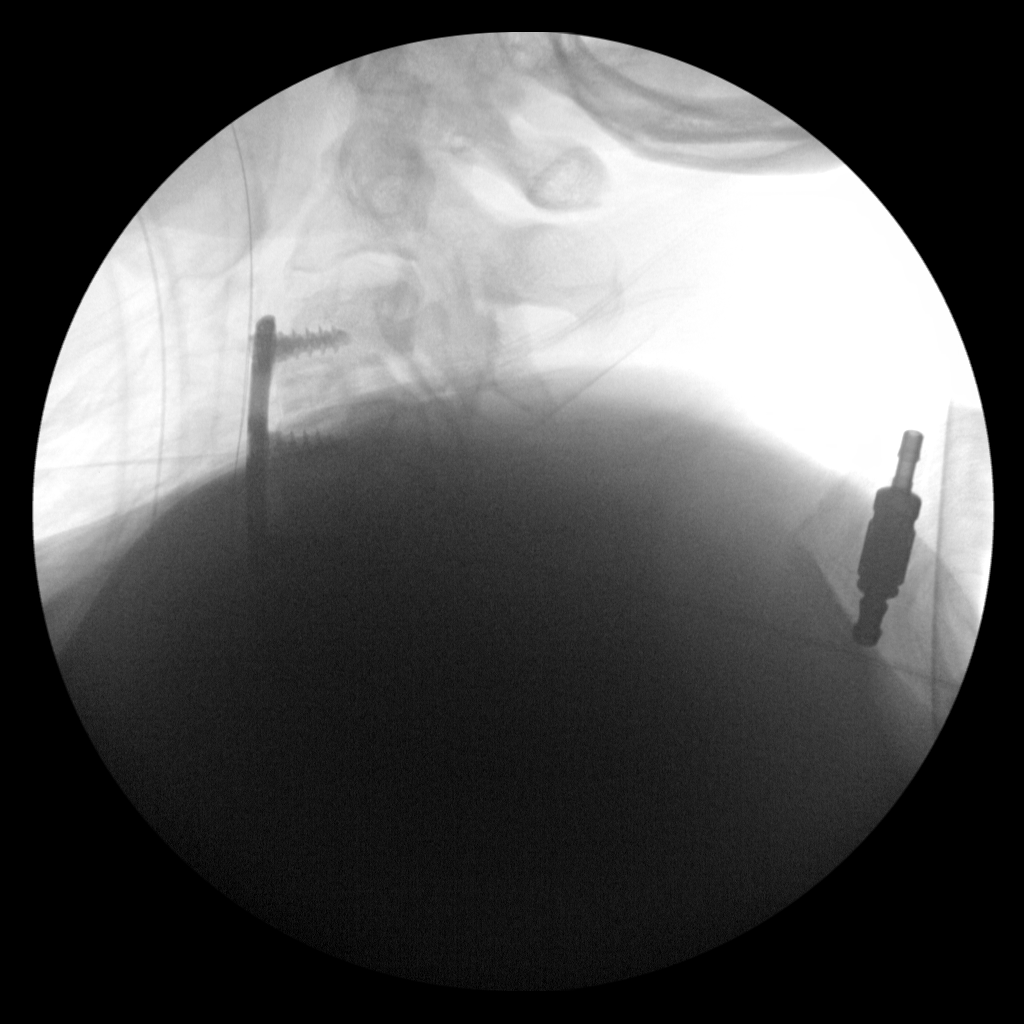
[im 2/2]
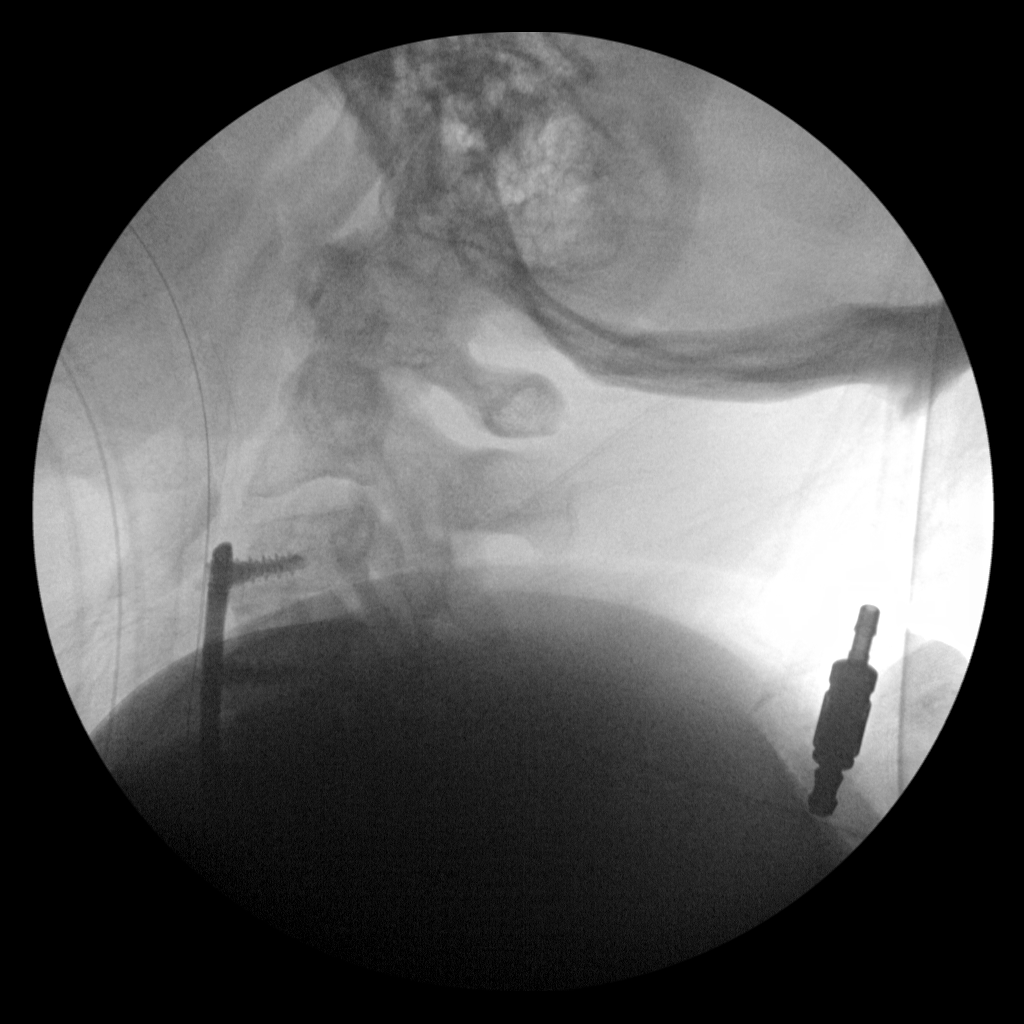

[2 of 2 positions shown; findings below may reference images not displayed]

FINDINGS: Two c-arm images demonstrate the patient has undergone
anterior cervical fusion at C3-4 and C4-5.  The plate and screws
appear in good alignment.  The plug at the C3-4 is visible.  The
details at C4-5 are not visible due to the patient's shoulders,
although the plug is faintly visualized.
IMPRESSION: Anterior cervical fusion performed at C3-4 and C4-5.

## 2009-12-13 IMAGING — CR DG CHEST 1V PORT
1 series · 1 of 1 positions shown · non-contrast
Comparison: 10/14/2007, [DATE] hours.

CLINICAL DATA: Stenosis.  Hypoxemia.

PORTABLE CHEST - 1 VIEW

[view not recorded]
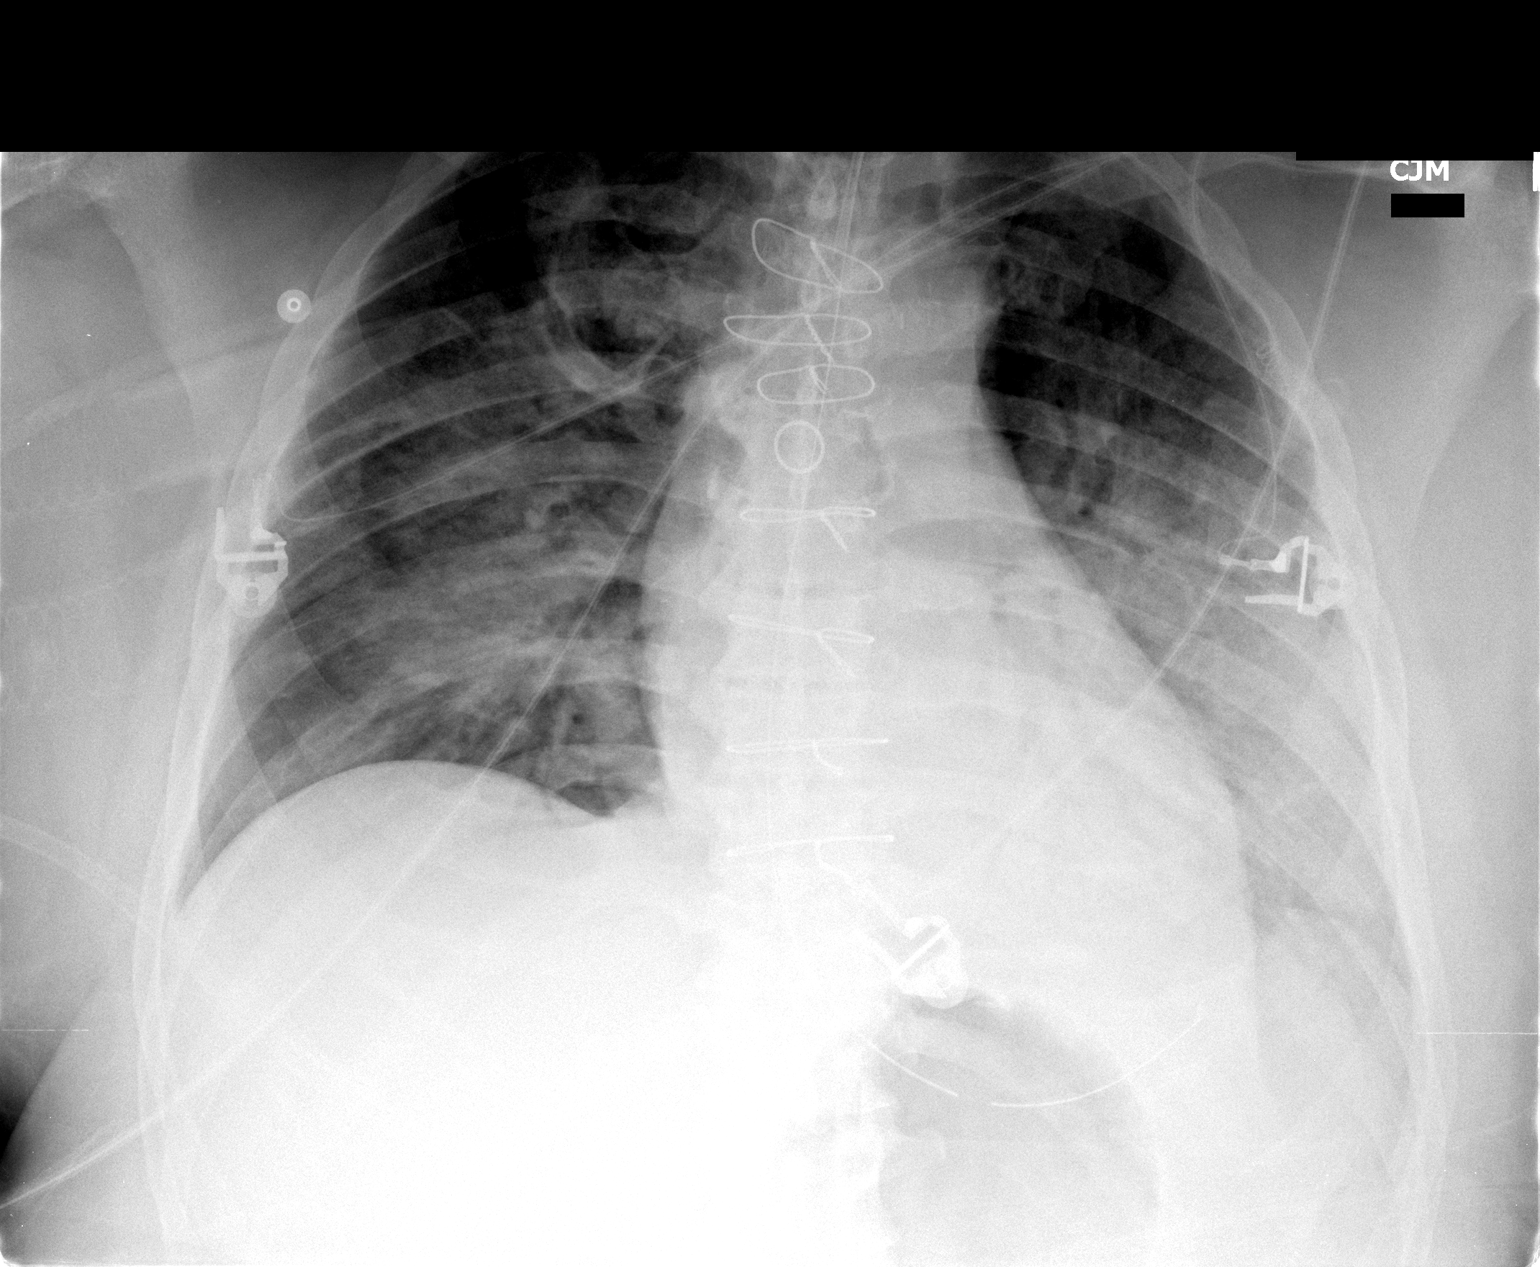

[1 of 1 positions shown; findings below may reference images not displayed]

FINDINGS: CABG.  Endotracheal tube.  Nasogastric tube.  Support
tubes are unchanged.  Minimal increase in airspace opacity within
the right lung, consistent with tiny increase in pulmonary edema.
Left lung hazy opacity/pulmonary edema unchanged.
Cardiomediastinal contours appear within normal limits and
unchanged from prior.  Retrocardiac opacity, likely representing
atelectasis.  Small left pleural effusion.
IMPRESSION: 1.  Stable support apparatus in good position.
2.  Slight increase and hazy opacity/pulmonary edema in the right
chest.

## 2009-12-13 IMAGING — CR DG CHEST 1V PORT
1 series · 1 of 1 positions shown · non-contrast
Comparison: 10/14/2007 at [DATE] p.m.

CLINICAL DATA: Bilateral pulmonary edema

PORTABLE CHEST - 1 VIEW [DATE] p.m.

[view not recorded]
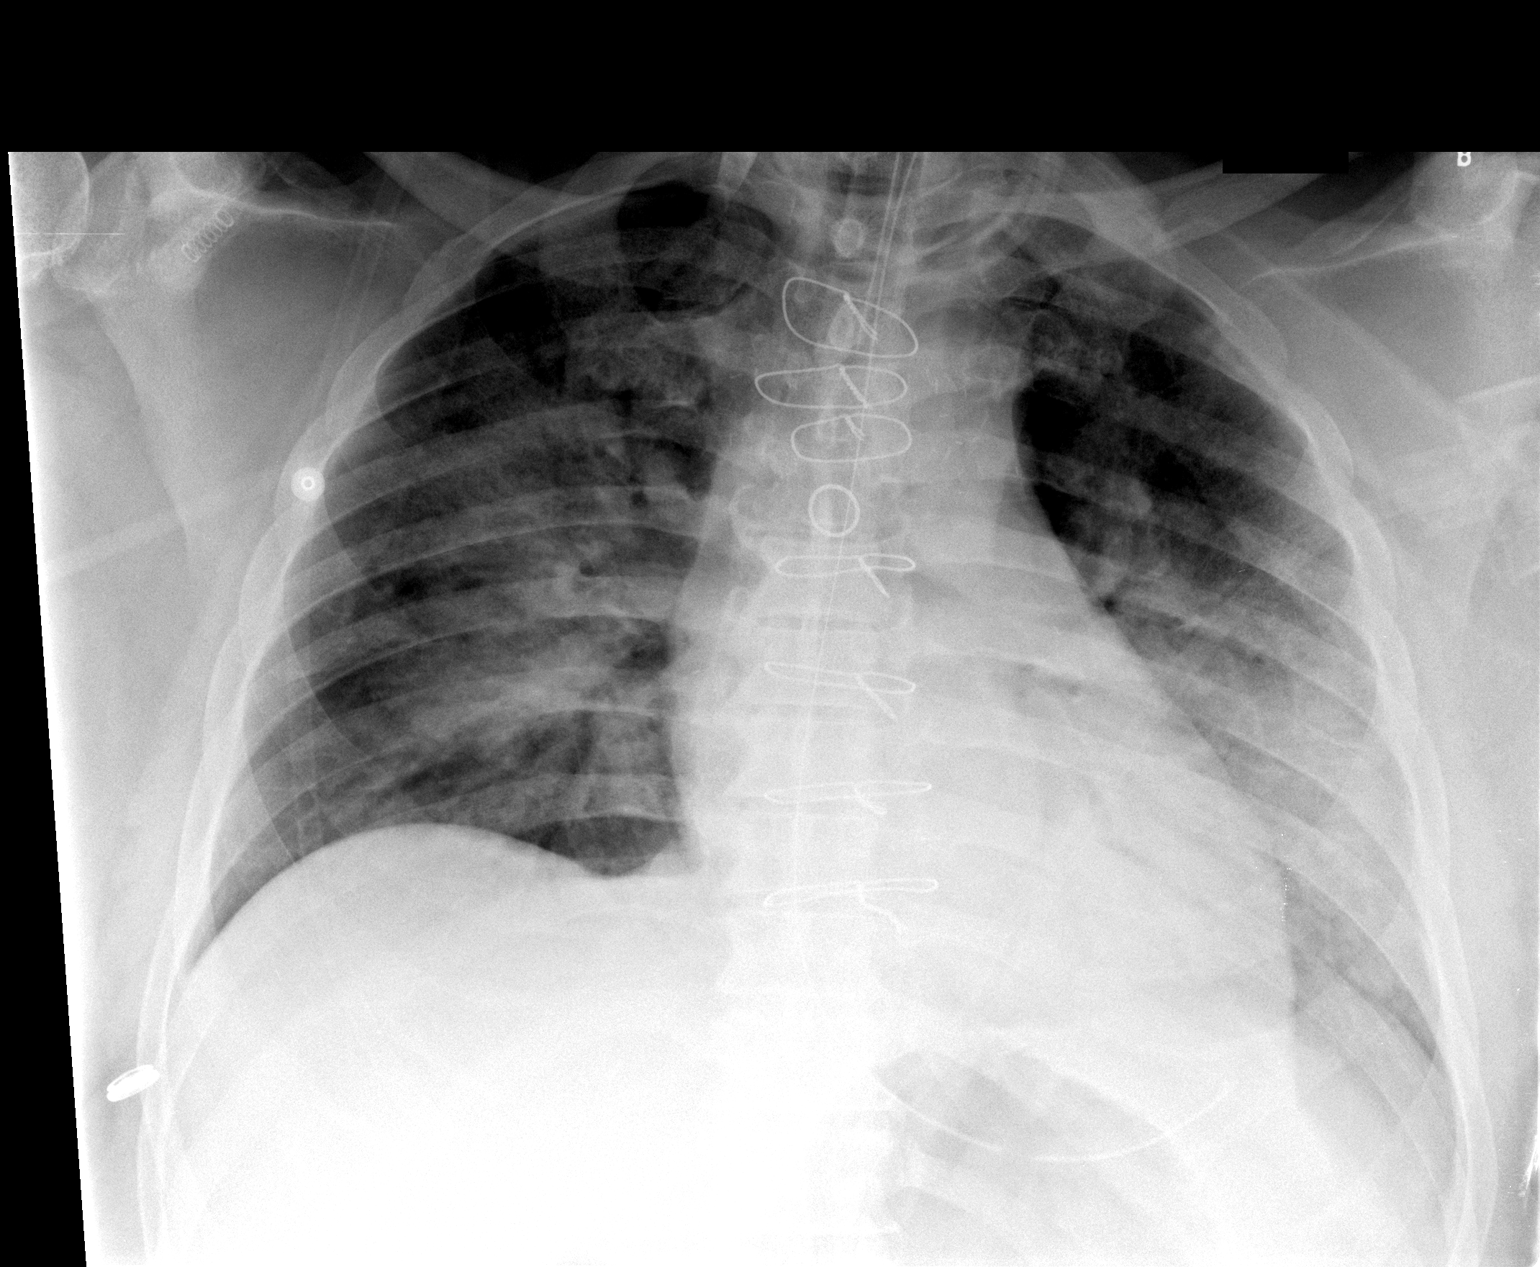

[1 of 1 positions shown; findings below may reference images not displayed]

FINDINGS: Endotracheal tube is in good position.  NG tube has been
inserted and appears in good position.  Again noted are bilateral
hazy infiltrates consistent with pulmonary edema.  The edema is
slightly more prominent bilaterally.
IMPRESSION: Endotracheal tube and NG tube in good position.  Slight increase in
the bilateral pulmonary edema.

## 2010-02-10 LAB — CONVERTED CEMR LAB
ALT: 23 units/L (ref 0–53)
AST: 20 units/L (ref 0–37)
Alkaline Phosphatase: 53 units/L (ref 39–117)
CK-MB: 2.1 ng/mL (ref 0.3–4.0)
Creatinine, Ser: 0.9 mg/dL (ref 0.40–1.50)
Relative Index: 1.3 (ref 0.0–2.5)
Sodium: 136 meq/L (ref 135–145)
Total Bilirubin: 0.6 mg/dL (ref 0.3–1.2)
Total Protein: 7.4 g/dL (ref 6.0–8.3)

## 2010-02-12 ENCOUNTER — Telehealth: Payer: Self-pay | Admitting: Internal Medicine

## 2010-02-12 ENCOUNTER — Observation Stay (HOSPITAL_COMMUNITY)
Admission: EM | Admit: 2010-02-12 | Discharge: 2010-02-13 | Disposition: A | Discharge status: Home / Self Care | Payer: MEDICARE | Attending: Internal Medicine | Admitting: Internal Medicine

## 2010-02-12 ENCOUNTER — Encounter: Payer: Self-pay | Admitting: Internal Medicine

## 2010-02-12 DIAGNOSIS — E039 Hypothyroidism, unspecified: Secondary | ICD-10-CM | POA: Insufficient documentation

## 2010-02-12 DIAGNOSIS — Z8673 Personal history of transient ischemic attack (TIA), and cerebral infarction without residual deficits: Secondary | ICD-10-CM | POA: Insufficient documentation

## 2010-02-12 DIAGNOSIS — R55 Syncope and collapse: Principal | ICD-10-CM | POA: Insufficient documentation

## 2010-02-12 DIAGNOSIS — M316 Other giant cell arteritis: Secondary | ICD-10-CM | POA: Insufficient documentation

## 2010-02-12 DIAGNOSIS — IMO0002 Reserved for concepts with insufficient information to code with codable children: Secondary | ICD-10-CM | POA: Insufficient documentation

## 2010-02-12 DIAGNOSIS — I498 Other specified cardiac arrhythmias: Secondary | ICD-10-CM | POA: Insufficient documentation

## 2010-02-12 DIAGNOSIS — G20 Parkinson's disease: Secondary | ICD-10-CM | POA: Insufficient documentation

## 2010-02-12 DIAGNOSIS — D649 Anemia, unspecified: Secondary | ICD-10-CM | POA: Insufficient documentation

## 2010-02-12 DIAGNOSIS — S91309A Unspecified open wound, unspecified foot, initial encounter: Secondary | ICD-10-CM | POA: Insufficient documentation

## 2010-02-12 DIAGNOSIS — Z79899 Other long term (current) drug therapy: Secondary | ICD-10-CM | POA: Insufficient documentation

## 2010-02-12 DIAGNOSIS — G20A1 Parkinson's disease without dyskinesia, without mention of fluctuations: Secondary | ICD-10-CM | POA: Insufficient documentation

## 2010-02-12 DIAGNOSIS — X58XXXA Exposure to other specified factors, initial encounter: Secondary | ICD-10-CM | POA: Insufficient documentation

## 2010-02-12 DIAGNOSIS — E7800 Pure hypercholesterolemia, unspecified: Secondary | ICD-10-CM | POA: Insufficient documentation

## 2010-02-12 DIAGNOSIS — Z7902 Long term (current) use of antithrombotics/antiplatelets: Secondary | ICD-10-CM | POA: Insufficient documentation

## 2010-02-12 DIAGNOSIS — R5381 Other malaise: Secondary | ICD-10-CM | POA: Insufficient documentation

## 2010-02-12 DIAGNOSIS — I10 Essential (primary) hypertension: Secondary | ICD-10-CM | POA: Insufficient documentation

## 2010-02-12 LAB — COMPREHENSIVE METABOLIC PANEL
AST: 18 U/L (ref 0–37)
Albumin: 3.5 g/dL (ref 3.5–5.2)
Alkaline Phosphatase: 57 U/L (ref 39–117)
CO2: 30 mEq/L (ref 19–32)
Chloride: 101 mEq/L (ref 96–112)
Creatinine, Ser: 0.89 mg/dL (ref 0.4–1.5)
GFR calc non Af Amer: >60 mL/min (ref >60)
Potassium: 3.5 mEq/L (ref 3.5–5.1)
Total Bilirubin: 0.4 mg/dL (ref 0.3–1.2)

## 2010-02-12 LAB — CBC
HCT: 38.1 % — ABNORMAL LOW (ref 39.0–52.0)
MCV: 78.1 fL (ref 78.0–100.0)
Platelets: 169 10*3/uL (ref 150–400)
RBC: 4.88 MIL/uL (ref 4.22–5.81)
WBC: 4 10*3/uL (ref 4.0–10.5)

## 2010-02-12 LAB — CK TOTAL AND CKMB (NOT AT ARMC): CK, MB: 1.2 ng/mL (ref 0.3–4.0)

## 2010-02-12 NOTE — Assessment & Plan Note (Signed)
Summary: per check out/sf      Allergies Added: NKDA  Visit Type:  1 yr f/u Primary Provider:  Chauncey Reading DO  CC:  no cardiac complaints today.  History of Present Illness: Mr Leazer returns today for further evaluation and management coronary artery disease. He is having no angina or ischemic symptoms.  He is compliant with his medications. His blood work is checked by his primary care physician.  Clinical Reports Reviewed:  Cardiac Cath:  04/13/2006: Cardiac Cath Findings:  CONCLUSION:  1. Coronary artery disease status post coronary bypass graft surgery      in 1999.  2. Severe native vessel disease with total occlusion of the LAD,      circumflex and right coronaries.  3. Patent LIMA graft to the distal right coronary artery with  30      percent narrowing at the distal anastomoses, patent vein graft to      the ramus branch of the circumflex artery with irregularities and      patent sequential LIMA graft to the diagonal branch and the LAD.   Mild left ventricular global hypokinesis with an estimated ejection  fraction of 45%.  The left ventriculogram performed in the RAO projection showed mild  global hypokinesis with estimated ejection fraction of 60%.  RECOMMENDATIONS:  All the grafts are patent and the patient has no  source of ischemia.  We will look for other causes for the patient's  chest pain.  We did an aortic root to rule out aortic dissection and  will get a D-dimer to screen for pulmonary embolism.  It is possible  that some of his symptoms could be related to reflux.  He also has leg  pain and will get a chest x-ray to evaluate him for lumbosacral spine  disease.  ADDENDUM:  The distal AV circumflex filled via collaterals from the  distal right coronary.  Bruce Elvera Lennox Juanda Chance, MD, [facility]  Electronically Signed  11/27/2004: Cardiac Cath Findings:  IMPRESSION/RECOMMENDATIONS:  There has been no evident change in his  coronary anatomy compared with  September 27, 2004.  The etiology of his  resting chest discomfort is not clear. Will observe him closely in  hospital  for at least 24 hours.   Salvadore Farber, M.D. [facility]  Nuclear Study:  03/27/2005:  Exercise capacity - Adenosine study with no exercise  Blood Pressure -  Clinical Symptoms -   ECG impression - Baseline: NSR; No significant ST segment change with adenosine  Overall impression -  Abnormal stress nuclear study. On the perfusion images there is evidence of a previous inferolateral infarct primarly at the base with mild peri-infarct ischemia. There is also evidence of anterior ischemia   Current Medications (verified): 1)  Aspirin 81 Mg Tbec (Aspirin) .... Take 1 Tablet By Mouth Once A Day 2)  Carvedilol 12.5 Mg  Tabs (Carvedilol) .... Take 1 Tablet By Mouth Two Times A Day 3)  Imdur 60 Mg Tb24 (Isosorbide Mononitrate) .... Take 1 Tablet By Mouth Once A Day 4)  Klor-Con 10 10 Meq Tbcr (Potassium Chloride) .... Take 1 Tablet By Mouth Once A Day 5)  Lisinopril 40 Mg Tabs (Lisinopril) .... Take 1 Tablet By Mouth Once A Day 6)  Hydrochlorothiazide 25 Mg Tabs (Hydrochlorothiazide) .... Take 1 Tablet By Mouth Once A Day 7)  Pravastatin Sodium 80 Mg  Tabs (Pravastatin Sodium) .... Take 1 Tablet By Mouth At Bedtime 8)  Nitroquick 0.4 Mg  Subl (Nitroglycerin) .... Take One Pill  Under Your Tongue Every 5 Minutes Up To Three Times For Chest Pain 9)  Niacin 500 Mg Tabs (Niacin) .... Take 1 Tablet By Mouth Two Times A Day.  Take An 81mg  Aspirin 30 Minutes Before Taking This Medication To Prevent A Possible Skin Flushing Reaction. 10)  Loratadine 10 Mg Tabs (Loratadine) .... Take 1 Tablet By Mouth Once A Day For Allergies and Post Nasal Drip 11)  Prilosec 20 Mg Cpdr (Omeprazole) .... Take 1 Tablet By Mouth Daily 12)  Vicodin 5-500 Mg Tabs (Hydrocodone-Acetaminophen) .Marland Kitchen.. 1-2 By Mouth At Bedtime As Needed Wrist Pain  Allergies (verified): No Known Drug Allergies  Past  History:  Past Medical History: Last updated: 04/05/2008 Hx of VDRF after cervical spine surgery 10/09 PEPTIC ULCER DISEASE (ICD-533.90) INADEQUATE MATERIAL RESOURCES (ICD-V60.2) SPINAL STENOSIS, CERVICAL (ICD-723.0) ERECTILE DYSFUNCTION (ICD-302.72) DENTAL CARIES (ICD-521.00) GLUCOSE INTOLERANCE (ICD-271.3) NUMBNESS, HAND (ICD-782.0) POLYURIA (ICD-788.42) SHOULDER PAIN, RIGHT (ICD-719.41) ABSCESS (ICD-682.9) SKIN TAG (ICD-701.9) OBESITY (ICD-278.00) FATIGUE (ICD-780.79) HYPERTENSION (ICD-401.9) HYPERLIPIDEMIA (ICD-272.4) THYROIDECTOMY, HX OF (ICD-V45.79) LEG PAIN, CHRONIC (ICD-729.5) DISC DISEASE, 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  Inguinal herniorrhaphy  Family History: Last updated: 04/05/2008 The patient's mother is deceased at the age of 87 with  myocardial infarction.  Father died at unknown age with a myocardial  infarction.  He has a sister that has had a bypass and brother with a  myocardial infarction  Social History: Last updated: 04/05/2008 Disabled (CABG) Never Smoked Alcohol use-no Drug use-no Regular exercise-yes Divorced   Risk Factors: Caffeine Use: 1 (04/09/2009) Exercise: yes (04/09/2009)  Risk Factors: Smoking Status: never (04/09/2009)  Review of Systems       negative other than history of present illness  Vital Signs:  Patient profile:   65 year old  male Height:      70 inches Weight:      274.8 pounds BMI:     39.57 Pulse rate:   66 / minute Pulse rhythm:   regular BP sitting:   110 / 70  (left arm) Cuff size:   large  Vitals Entered By: Danielle Rankin, CMA (September 28, 2009 3:17 PM)  Physical Exam  General:  obese.  no acute distress Head:  normocephalic and atraumatic Eyes:  PERRLA/EOM intact; conjunctiva and lids normal. Mouth:  poor dentition.   Neck:  Neck supple, no JVD. No masses, thyromegaly or abnormal cervical nodes. Chest Wall:  no deformities or breast masses noted Lungs:  Clear bilaterally to auscultation and percussion. Heart:  PMI poorly appreciated, soft S1-S2, no murmur rub or gallop Msk:  decreased ROM.   Pulses:  pulses normal in all 4 extremities Extremities:  No clubbing or cyanosis. Neurologic:  Alert and oriented x 3. Psych:  Normal affect.   EKG  Procedure date:  09/28/2009  Findings:      normal sinus rhythm, normal EKG  Impression & Recommendations:  Problem # 1:  CAD, ARTERY BYPASS GRAFT (ICD-414.04)  His updated medication list for this problem includes:    Aspirin 81 Mg Tbec (Aspirin) .Marland Kitchen... Take 1 tablet by mouth once a day    Carvedilol 12.5 Mg Tabs (  Carvedilol) .Marland Kitchen... Take 1 tablet by mouth two times a day    Imdur 60 Mg Tb24 (Isosorbide mononitrate) .Marland Kitchen... Take 1 tablet by mouth once a day    Lisinopril 40 Mg Tabs (Lisinopril) .Marland Kitchen... Take 1 tablet by mouth once a day    Nitroquick 0.4 Mg Subl (Nitroglycerin) .Marland Kitchen... Take one pill under your tongue every 5 minutes up to three times for chest pain  Orders: EKG w/ Interpretation (93000)  Patient Instructions: 1)  Your physician recommends that you schedule a follow-up appointment in: 1 year with Dr. Daleen Squibb 2)  Your physician recommends that you continue on your current medications as directed. Please refer to the Current Medication list given to you today.

## 2010-02-12 NOTE — Progress Notes (Signed)
Summary: refill/ hla  Phone Note Refill Request Message from:  Fax from Pharmacy on January 24, 2009 4:40 PM  Refills Requested: Medication #1:  IMDUR 60 MG TB24 Take 1 tablet by mouth once a day   Last Refilled: 11/21/2008 Initial call taken by: Marin Roberts RN,  January 24, 2009 4:43 PM  Follow-up for Phone Call        Supposed to F/U Dr Thayer Jew Jan and MArch 2011.  Will refill medto last until March. Follow-up by: Blanch Media MD,  January 24, 2009 4:45 PM    Prescriptions: IMDUR 60 MG TB24 (ISOSORBIDE MONONITRATE) Take 1 tablet by mouth once a day  #30 Tablet x 2   Entered and Authorized by:   Blanch Media MD   Signed by:   Blanch Media MD on 01/24/2009   Method used:   Electronically to        [facility]       [address]       [place], [place]  [address]       Ph: [phone]       Fax: [phone]   RxID:   [id number]   Appended Document: refill/ hla please make an appt for pt in near future  thanks, h.

## 2010-02-12 NOTE — Consult Note (Signed)
[name]  [facility]   Imported By: Margie Billet 10/19/2009 09:58:28  _____________________________________________________________________  External Attachment:    Type:   Image     Comment:   External Document

## 2010-02-12 NOTE — Assessment & Plan Note (Signed)
Summary: est-ck/fu/meds/cfb   Vital Signs:  Patient profile:   65 year old male Height:      71 inches (180.34 cm) Weight:      298.1 pounds (132.82 kg) BMI:     40.90 Temp:     96.5 degrees F (35.83 degrees C) oral Pulse rate:   70 / minute BP sitting:   154 / 90  (right arm) Cuff size:   large  Vitals Entered By: Theotis Barrio NT II (March 05, 2009 1:35 PM) CC: SOB /  HAVING TROUBLE SLEEPING  /  MEDICATION REFILL  /  RIGHT FOOT - ?  CYST  ON TOP ON FOOT,  Is Patient Diabetic? No Pain Assessment Patient in pain? yes     Location: chest Intensity:     7 Type: PULLING Nutritional Status BMI of > 30 = obese  Have you ever been in a relationship where you felt threatened, hurt or afraid?No   Does patient need assistance? Functional Status Self care Ambulation Normal Comments SOB /  HAVING TROUBLE SLEEPING /  MEDICATION REFILL /  RIGHT FOOT -? CYST ON TOP OF FOOT   Primary Care Provider:  Chauncey Reading DO  CC:  SOB /  HAVING TROUBLE SLEEPING  /  MEDICATION REFILL  /  RIGHT FOOT - ?  CYST  ON TOP ON FOOT and .  History of Present Illness: William [name] is a 65 yo gentleman with PMH significant for morbid obesity, HTN, HLD, and CAD s/p CABG in 1999 who  returns for a wellness checkup. He is doing well overall. He c/o some "burning" in his chest when he lays down to sleep, which he says is also associated with cough. This has been going on for many years and seems to have gotten worse since he stared taking Ibuprofen for his arm pain. He denies any chest pain per se. He also reports a cyst on the top of his foot x 4-5 months. It doesn't hurt him, though he does note its presence when he has shoes on. No other complaints.  Depression History:      The patient denies a depressed mood most of the day and a diminished interest in his usual daily activities.         Preventive Screening-Counseling & Management  Alcohol-Tobacco     Alcohol type: occasional     Smoking Status:  never  Caffeine-Diet-Exercise     Caffeine use/day: 1     Does Patient Exercise: yes     Type of exercise: walking,exercise program     Times/week: 3  Current Medications (verified): 1)  Aspirin 81 Mg Tbec (Aspirin) .... Take 1 Tablet By Mouth Once A Day 2)  Carvedilol 12.5 Mg  Tabs (Carvedilol) .... Take 1 Tablet By Mouth Two Times A Day 3)  Imdur 60 Mg Tb24 (Isosorbide Mononitrate) .... Take 1 Tablet By Mouth Once A Day 4)  Klor-Con 10 10 Meq Tbcr (Potassium Chloride) .... Take 1 Tablet By Mouth Once A Day 5)  Lisinopril 40 Mg Tabs (Lisinopril) .... Take 1 Tablet By Mouth Once A Day 6)  Hydrochlorothiazide 25 Mg Tabs (Hydrochlorothiazide) .... Take 1 Tablet By Mouth Once A Day 7)  Pravastatin Sodium 80 Mg  Tabs (Pravastatin Sodium) .... Take 1 Tablet By Mouth At Bedtime 8)  Nitroquick 0.4 Mg  Subl (Nitroglycerin) .... Take One Pill Under Your Tongue Every 5 Minutes Up To Three Times For Chest Pain 9)  Niacin 500 Mg Tabs (Niacin) .... Take  1 Tablet By Mouth Two Times A Day.  Take An 81mg  Aspirin 30 Minutes Before Taking This Medication To Prevent A Possible Skin Flushing Reaction. 10)  Loratadine 10 Mg Tabs (Loratadine) .... Take 1 Tablet By Mouth Once A Day For Allergies and Post Nasal Drip 11)  Prilosec 20 Mg Cpdr (Omeprazole) .... Take 1 Tablet By Mouth Daily  Allergies (verified): No Known Drug Allergies  Past History:  Past Medical History: Last updated: 04/05/2008 Hx of VDRF after cervical spine surgery 10/09 PEPTIC ULCER DISEASE (ICD-533.90) INADEQUATE MATERIAL RESOURCES (ICD-V60.2) SPINAL STENOSIS, CERVICAL (ICD-723.0) ERECTILE DYSFUNCTION (ICD-302.72) DENTAL CARIES (ICD-521.00) GLUCOSE INTOLERANCE (ICD-271.3) NUMBNESS, HAND (ICD-782.0) POLYURIA (ICD-788.42) SHOULDER PAIN, RIGHT (ICD-719.41) ABSCESS (ICD-682.9) SKIN TAG (ICD-701.9) OBESITY (ICD-278.00) FATIGUE (ICD-780.79) HYPERTENSION (ICD-401.9) HYPERLIPIDEMIA (ICD-272.4) THYROIDECTOMY, HX OF (ICD-V45.79) LEG  PAIN, CHRONIC (ICD-729.5) DISC DISEASE, 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  Inguinal herniorrhaphy  Family History: Last updated: 04/05/2008 The patient's mother is deceased at the age of 65 with  myocardial infarction.  Father died at unknown age with a myocardial  infarction.  He has a sister that has had a bypass and brother with a  myocardial infarction  Social History: Last updated: 04/05/2008 Disabled (CABG) Never Smoked Alcohol use-no Drug use-no Regular exercise-yes Divorced   Risk Factors: Caffeine Use: 1 (03/05/2009) Exercise: yes (03/05/2009)  Risk Factors: Smoking Status: never (03/05/2009)  Review of Systems      See HPI  Physical Exam  General:  Well-developed,well-nourished,in no acute distress; alert,appropriate and cooperative throughout examination Head:  Normocephalic and atraumatic without obvious abnormalities.  Lungs:  Normal respiratory effort, chest expands symmetrically. Lungs are clear to auscultation, no crackles or wheezes. Heart:  Normal rate and regular rhythm. S1 and S2 normal without gallop, murmur, click, rub or other extra sounds. Abdomen:  Bowel sounds positive,abdomen soft and non-tender without masses, organomegaly or hernias noted. Extremities:  soft, nontender, freely mobile cyst on dorsum of R foot Neurologic:  alert & oriented X3.   Psych:  Cognition and  judgment appear intact. Alert and cooperative with normal attention span and concentration. No apparent delusions, illusions, hallucinations   Impression & Recommendations:  Problem # 1:  GERD (ICD-530.81) Patient's symptoms of burning when he lays down associated with cough are consistent with GERD exacerbation. I have replaced his famotidine with omeprazole. I also told him that Ibuprofen can sometimes irritate his stomach, so to try Tylenol for pain. If that does not work, he can go back to using Ibuprofen provided it doesn't worsen his GERD. Omeprazole should hopefully protect his stomach from NSAIDs.  The following medications were removed from the medication list:    Famotidine 20 Mg Tabs (Famotidine) .[facility]... Take 1 tablet by mouth two times a day His updated medication list for this problem includes:    Prilosec 20 Mg Cpdr (Omeprazole) .[facility]... Take 1 tablet by mouth daily  Problem # 2:  GANGLION CYST (ICD-727.43) Patient has likely ganglion cyst on dorsum of R foot. It doesn't seem to bother him except that he notices it when he puts on his shoes. I told him that they pose no harm  to him, and that sometimes they regress on their own while other times they require surgical removal. He said he would like to wait another few months to see if it goes away on its own before deciding whether or not to get it removed.  Problem # 3:  HYPERTENSION, BENIGN (ICD-401.1) Blood pressure well-controlled (his repeat BP was 130/80 or 90). I will have the patient return to clinic in 3 months, at which time it should be determined when he needs to get his Bmet checked. I have provided refills.  His updated medication list for this problem includes:    Carvedilol 12.5 Mg Tabs (Carvedilol) .Marland Kitchen... Take 1 tablet by mouth two times a day    Lisinopril 40 Mg Tabs (Lisinopril) .Mar[facility] Kitchen... Take 1 tablet by mouth once a day    Hydrochlorothiazide 25 Mg Tabs (Hydrochlorothiazide) .Marland Kitchen... 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 He sees a physcial therapist for this. I told him to continue using the brace as much as possible and to try Tylenol for his pain instead of Ibuprofen. If the Tylenol doesn't help the pain, he can continue to use the Ibuprofen as long as his gastric/esophageal burning does not worsen.   Complete Medication List: 1)  Aspirin 81 Mg Tbec (Aspirin) .... Take 1 tablet by mouth once a day 2)  Carvedilol 12.5 Mg Tabs (Carvedilol) .... Take 1 tablet by mouth two times a day 3)  Imdur 60 Mg Tb24 (Isosorbide mononitrate) .... Take 1 tablet by mouth once a day 4)  Klor-con 10 10 Meq Tbcr (Potassium chloride) .... Take 1 tablet by mouth once a day 5)  Lisinopril 40 Mg Tabs (Lisinopril) .... Take 1 tablet by mouth once a day 6)  Hydrochlorothiazide 25 Mg Tabs (Hydrochlorothiazide) .... Take 1 tablet by mouth once a day 7)  Pravastatin Sodium 80 Mg Tabs (Pravastatin sodium) .... Take 1 tablet by mouth at bedtime 8)  Nitroquick 0.4 Mg Subl (Nitroglycerin) .... Take one pill under your tongue every 5 minutes up to three times for chest pain 9)  Niacin 500 Mg Tabs (Niacin) .... Take 1 tablet by mouth two times a day.  take an 81mg  aspirin 30 minutes before taking this medication to prevent a possible skin flushing reaction. 10)  Loratadine 10 Mg Tabs (Loratadine) .... Take 1 tablet by mouth once a day for allergies and post nasal drip 11)  Prilosec 20 Mg Cpdr (Omeprazole) .... Take 1 tablet by mouth daily  Patient Instructions: 1)  Please schedule a follow-up appointment with your PCP in 3 months. Call the clinic to be soon earlier if needed. 2)  Please stop taking the famotidine. Instead, I have prescribed a medication for you called omeprazole. Start taking this as directed. These should help your burning pain that I believe are caused by Reflux.  3)  Please try to take Tylenol  instead of Ibuprofen if possible. If Tylenol is not controlling your pain, stop taking it and continue to take Ibuprofen. Prescriptions: PRILOSEC 20 MG CPDR (OMEPRAZOLE) Take 1 tablet by mouth daily  #30 x 6   Entered and Authorized by:   Silvestre Gunner MD   Signed by:   Silvestre Gunner MD on 03/05/2009   Method used:   Electronically to        [facility] (retail)  [address]       [place], [place]  [address]       Ph: [phone]       Fax: [phone]   RxID:   [id number] LORATADINE 10 MG TABS (LORATADINE) Take 1 tablet by mouth once a day for allergies and post nasal drip  #30 Tablet x 10   Entered and Authorized by:   Silvestre Gunner MD   Signed by:   Silvestre Gunner MD on 03/05/2009   Method used:   Electronically to        [facility])       [address]       [place], [place]  [address]       Ph: [phone]       Fax: [phone]   RxID:   [id number] NIACIN 500 MG TABS (NIACIN) Take 1 tablet by mouth two times a day.  Take an 81mg  aspirin 30 minutes before taking this medication to prevent a possible skin flushing reaction.  #60 x 6   Entered and Authorized by:   Silvestre Gunner MD   Signed by:   Silvestre Gunner MD on 03/05/2009   Method used:   Faxed to ...       [facility]       [address]       [place], [place]  [address]       Ph: [phone]       Fax: [phone]   RxID:   [id number] NITROQUICK 0.4 MG  SUBL (NITROGLYCERIN) Take one pill under your tongue every 5 minutes up to three times for chest pain  #45 x prn   Entered and Authorized by:   Silvestre Gunner MD   Signed by:   Silvestre Gunner MD on 03/05/2009   Method used:   Electronically to        [facility] ([facility])       [address]       [place], [place]  [address]       Ph: [phone]       Fax: [phone]   RxID:    [id number] PRAVASTATIN SODIUM 80 MG  TABS (PRAVASTATIN SODIUM) Take 1 tablet by mouth at bedtime  #30 x 6   Entered and Authorized by:   Silvestre Gunner MD   Signed by:   Silvestre Gunner MD on 03/05/2009   Method used:   Electronically to        [facility] ([facility])       [address]       [place], [place]  [address]       Ph: [phone]       Fax: [phone]   RxID:   [id number] HYDROCHLOROTHIAZIDE 25 MG TABS (HYDROCHLOROTHIAZIDE) Take 1 tablet by mouth once a day  #30 x 6   Entered and Authorized by:   Silvestre Gunner MD   Signed by:   Silvestre Gunner MD on 03/05/2009   Method used:   Electronically to        [facility] ([facility])       [address]       [place], [place]  [address]  Ph: [phone]       Fax: [phone]   RxID:   [id number] LISINOPRIL 40 MG TABS (LISINOPRIL) Take 1 tablet by mouth once a day  #30 x 11   Entered and Authorized by:   Silvestre Gunner MD   Signed by:   Silvestre Gunner MD on 03/05/2009   Method used:   Electronically to        [facility] ([facility])       [address]       [place], [place]  [address]       Ph: [phone]       Fax: [phone]   RxID:   [id number] KLOR-CON 10 10 MEQ TBCR (POTASSIUM CHLORIDE) Take 1 tablet by mouth once a day  #30 Tablet x 6   Entered and Authorized by:   Silvestre Gunner MD   Signed by:   Silvestre Gunner MD on 03/05/2009   Method used:   Electronically to        [facility] ([facility])       [address]       [place], [place]  [address]       Ph: [phone]       Fax: [phone]   RxID:   [id number] IMDUR 60 MG TB24 (ISOSORBIDE MONONITRATE) Take 1 tablet by mouth once a day  #30 Tablet x 6   Entered and Authorized by:   Silvestre Gunner MD   Signed by:   Silvestre Gunner MD on 03/05/2009   Method used:   Electronically to         [facility] ([facility])       [address]       [place], [place]  [address]       Ph: [phone]       Fax: [phone]   RxID:   [id number] CARVEDILOL 12.5 MG  TABS (CARVEDILOL) Take 1 tablet by mouth two times a day  #60 Tablet x 6   Entered and Authorized by:   Silvestre Gunner MD   Signed by:   Silvestre Gunner MD on 03/05/2009   Method used:   Electronically to        [facility] (retail)       [address]       [place], [place]  [address]       Ph: [phone]       Fax: [phone]   RxID:   [id number]    Prevention & Chronic Care Immunizations   Influenza vaccine: Fluvax MCR  (11/01/2008)    Tetanus booster: Not documented    Pneumococcal vaccine: Not documented    H. zoster vaccine: Not documented  Colorectal Screening   Hemoccult: Not documented    Colonoscopy: Results: Polyp.  Pathology:  Adenomatous polyp.          (11/05/2006)   Colonoscopy action/deferral: Repeat colonoscopy in 3 years.     (11/05/2006)   Colonoscopy due: 11/2009  Other Screening   PSA: Not documented   Smoking status: never  (03/05/2009)  Lipids   Total Cholesterol: 164  (02/03/2008)   LDL: 105  (02/03/2008)   LDL Direct: Not documented   HDL: 38  (02/03/2008)   Triglycerides: 104  (02/03/2008)    SGOT (AST): 22  (11/25/2006)  SGPT (ALT): 22  (11/25/2006)   Alkaline phosphatase: 59  (11/25/2006)   Total bilirubin: 0.5  (11/25/2006)  Hypertension   Last Blood Pressure: 154 / 90  (03/05/2009)   Serum creatinine: 1.04  (02/03/2008)   Serum potassium 3.7  (02/03/2008)  Self-Management Support :    Patient will work on the following items until the next clinic visit to reach self-care goals:     Medications and monitoring: take my medicines every day, bring all of my medications to every visit  (03/05/2009)     Eating: drink diet soda or water instead of juice or soda, eat more vegetables,  use fresh or frozen vegetables, eat foods that are low in salt, eat baked foods instead of fried foods, limit or avoid alcohol  (03/05/2009)     Activity: take a 30 minute walk every day  (03/05/2009)    Hypertension self-management support: Not documented    Lipid self-management support: Not documented

## 2010-02-12 NOTE — Assessment & Plan Note (Signed)
Summary: flu/pcp-patel/hla  Nurse Visit   Allergies: No Known Drug Allergies  Immunizations Administered:  Influenza Vaccine # 1:    Vaccine Type: Fluvax MCR    Site: left deltoid    Mfr: GlaxoSmithKline    Dose: 0.5 ml    Route: IM    Given by: Stanton Kidney Ditzler RN    Exp. Date: 07/13/2010    Lot #: [id number]    VIS given: 08/07/09 version given October 08, 2009.  Flu Vaccine Consent Questions:    Do you have a history of severe allergic reactions to this vaccine? no    Any prior history of allergic reactions to egg and/or gelatin? no    Do you have a sensitivity to the preservative Thimersol? no    Do you have a past history of Guillan-Barre Syndrome? no    Do you currently have an acute febrile illness? no    Have you ever had a severe reaction to latex? no    Vaccine information given and explained to patient? yes  Orders Added: 1)  Influenza Vaccine MCR [00025]

## 2010-02-12 NOTE — Progress Notes (Signed)
Summary: med refill/gp  Phone Note Refill Request Message from:  Fax from Pharmacy on January 30, 2009 3:21 PM  Refills Requested: Medication #1:  PRAVASTATIN SODIUM 80 MG  TABS Take 1 tablet by mouth at bedtime   Last Refilled: 11/28/2008 Last appt. 09/29/08; she has an appt. 03/05/09 w/Dr. Tobie Lords.  Last Lipid profile 02/03/08.   Method Requested: Electronic Initial call taken by: Chinita Pester RN,  January 30, 2009 3:21 PM  Follow-up for Phone Call        Looks like last blood work one year ago and didn't include LFT's.  Will refill one month.  Needs repeat lipids and LFT's ASAP.   Follow-up by: Blanch Media MD,  January 30, 2009 4:17 PM    Prescriptions: PRAVASTATIN SODIUM 80 MG  TABS (PRAVASTATIN SODIUM) Take 1 tablet by mouth at bedtime  #30 x 0   Entered and Authorized by:   Blanch Media MD   Signed by:   Blanch Media MD on 01/30/2009   Method used:   Electronically to        [facility]       [address]       [place], [place]  [address]       Ph: [phone]       Fax: [phone]   RxID:   [id number]

## 2010-02-12 NOTE — Miscellaneous (Signed)
Summary: [facility] Progress Note  [facility] Progress Note   Imported By: Roderic Ovens 03/26/2009 11:04:34  _____________________________________________________________________  External Attachment:    Type:   Image     Comment:   External Document

## 2010-02-12 NOTE — Assessment & Plan Note (Signed)
Summary: NP,R HAND PAIN,MC   Vital Signs:  Patient profile:   65 year old male Height:      70 inches Weight:      280 pounds BP sitting:   164 / 100  Vitals Entered By: Lillia Pauls CMA (April 13, 2009 10:26 AM)  Primary Care Provider:  Chauncey Reading DO   History of Present Illness: DATE of INJURY (approximate): 03/13/2009  right wrist pain for 1 onth. No specific injury. Was insiduous at first, becoming severley painful in a few days. left hand dominant. recalls no new activity, noinjry.  He has some  pain at rest but worse with any motion of wrist. pain at night keeping him awake. Propping on pillow minimallyhelpful  No hamd or finger numbness. pain is on radial side of wrist extending up to mid forearm. Aching and sharp  Had x rays recebnt;y   PERTINENT PMH/PSH: Cervical spine surgery (fusion) 2009. had signs at that time of B hand numbness. previously worked heavy labor   Current Medications (verified): 1)  Aspirin 81 Mg Tbec (Aspirin) .... Take 1 Tablet By Mouth Once A Day 2)  Carvedilol 12.5 Mg  Tabs (Carvedilol) .... Take 1 Tablet By Mouth Two Times A Day 3)  Imdur 60 Mg Tb24 (Isosorbide Mononitrate) .... Take 1 Tablet By Mouth Once A Day 4)  Klor-Con 10 10 Meq Tbcr (Potassium Chloride) .... Take 1 Tablet By Mouth Once A Day 5)  Lisinopril 40 Mg Tabs (Lisinopril) .... Take 1 Tablet By Mouth Once A Day 6)  Hydrochlorothiazide 25 Mg Tabs (Hydrochlorothiazide) .... Take 1 Tablet By Mouth Once A Day 7)  Pravastatin Sodium 80 Mg  Tabs (Pravastatin Sodium) .... Take 1 Tablet By Mouth At Bedtime 8)  Nitroquick 0.4 Mg  Subl (Nitroglycerin) .... Take One Pill Under Your Tongue Every 5 Minutes Up To Three Times For Chest Pain 9)  Niacin 500 Mg Tabs (Niacin) .... Take 1 Tablet By Mouth Two Times A Day.  Take An 81mg  Aspirin 30 Minutes Before Taking This Medication To Prevent A Possible Skin Flushing Reaction. 10)  Loratadine 10 Mg Tabs (Loratadine) .... Take 1 Tablet By Mouth Once  A Day For Allergies and Post Nasal Drip 11)  Prilosec 20 Mg Cpdr (Omeprazole) .... Take 1 Tablet By Mouth Daily  Allergies (verified): No Known Drug Allergies  Past History:  Past Medical History: Last updated: 04/05/2008 Hx of VDRF after cervical spine surgery 10/09 PEPTIC ULCER DISEASE (ICD-533.90) INADEQUATE MATERIAL RESOURCES (ICD-V60.2) SPINAL STENOSIS, CERVICAL (ICD-723.0) ERECTILE DYSFUNCTION (ICD-302.72) DENTAL CARIES (ICD-521.00) GLUCOSE INTOLERANCE (ICD-271.3) NUMBNESS, HAND (ICD-782.0) POLYURIA (ICD-788.42) SHOULDER PAIN, RIGHT (ICD-719.41) ABSCESS (ICD-682.9) SKIN TAG (ICD-701.9) OBESITY (ICD-278.00) FATIGUE (ICD-780.79) HYPERTENSION (ICD-401.9) HYPERLIPIDEMIA (ICD-272.4) THYROIDECTOMY, HX OF (ICD-V45.79) LEG PAIN, CHRONIC (ICD-729.5) DISC DISEASE, 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  Inguinal herniorrhaphy  Social History: Last updated: 04/05/2008 Disabled (CABG) Never Smoked Alcohol use-no Drug use-no Regular exercise-yes Divorced   Review of Systems       denies neck pain, denies left shoulder pain. no dizziness. also see hpi Endo:  Denies cold intolerance and heat intolerance.  Physical Exam  General:  alert.   Msk:  WRIST extensors and flexors symmetrical and B =. Normal ROm in flexion and extension. + Finklesteins right.  tender over right APL tendon. resisted thumb  abdcution and extension both reproduce his pain. thenar and hypothenar eminence without atrophy Pulses:  radial pulse 2+ B= Extremities:  normal muscle bulk and tone B forearms Additional Exam:  Patient given informed consent for injection. Discussed possible complications of infection, bleeding or skin atrophy at site of injection. Possible side effect of avascular necrosis (focal area of bone death) due to steroid use.Appropriate verbal time out taken Are cleaned and prepped in usual sterile fashion. A -1--- cc kennalog plus --1--cc 1% lidocaine without epinephrine was injected into the---tendon sheaths of APL and EPB. Patient tolerated procedure well with no complications.    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 rtc 2-3 w. consider adding rehab at that time small amt pain med given  Complete Medication List: 1)  Aspirin 81 Mg Tbec (Aspirin) .... Take 1 tablet by mouth once a day 2)  Carvedilol 12.5 Mg Tabs (Carvedilol) .... Take 1 tablet by mouth two times a day 3)  Imdur 60 Mg Tb24 (Isosorbide mononitrate) .... Take 1 tablet by mouth once a day 4)  Klor-con 10 10 Meq Tbcr (Potassium chloride) .... Take 1 tablet by mouth once a day 5)  Lisinopril 40 Mg Tabs (Lisinopril) .... Take 1 tablet by mouth once a day 6)  Hydrochlorothiazide 25 Mg Tabs (Hydrochlorothiazide) .... Take 1 tablet by mouth once a day 7)  Pravastatin Sodium 80 Mg Tabs (Pravastatin sodium) .... Take 1 tablet by mouth at bedtime 8)  Nitroquick 0.4 Mg Subl (Nitroglycerin) .... Take one pill under your tongue every 5 minutes up to three times for chest pain 9)  Niacin 500 Mg Tabs (Niacin) .... Take 1 tablet by mouth two times a day.  take an 81mg   aspirin 30 minutes before taking this medication to prevent a possible skin flushing reaction. 10)  Loratadine 10 Mg Tabs (Loratadine) .... Take 1 tablet by mouth once a day for allergies and post nasal drip 11)  Prilosec 20 Mg Cpdr (Omeprazole) .... Take 1 tablet by mouth daily 12)  Vicodin 5-500 Mg Tabs (Hydrocodone-acetaminophen) .Marland Kitchen.. 1-2 by mouth at bedtime as needed wrist pain Prescriptions: VICODIN 5-500 MG TABS (HYDROCODONE-ACETAMINOPHEN) 1-2 by mouth at bedtime as needed wrist pain  #30 x 0   Entered and Authorized by:   Denny Levy MD   Signed by:   Denny Levy MD on 04/13/2009   Method used:   Handwritten   RxID:   [id number]

## 2010-02-12 NOTE — Progress Notes (Signed)
Summary: Walk-in/gg  Phone Note Call from Patient   Summary of Call: Pt came into clinic with c/o rt wrist pain.  He states he hit this area on a swinging door 1 month ago and has been having pain since.  Rates pain 9/10.   Good movement, no bruising noted.  We have no appointments today, pt sent to [facility].  Last OV faxed to [facility] Initial call taken by: Merrie Roof RN,  February 05, 2009 8:48 AM  Follow-up for Phone Call        Agree with Urgent care visit.  Follow-up by: Blanch Media MD,  February 05, 2009 9:40 AM

## 2010-02-12 NOTE — Assessment & Plan Note (Signed)
Summary: ACUTE/ARM PAIN/OK TO ADD ON PER GAYLE/CH   Vital Signs:  Patient profile:   65 year old male Height:      71 inches (180.34 cm) Weight:      292.6 pounds (133 kg) BMI:     40.96 Temp:     96.9 degrees F (36.06 degrees C) oral Pulse rate:   77 / minute BP sitting:   146 / 83  (right arm)  Vitals Entered By: Chinita Pester RN (April 09, 2009 9:58 AM) CC: Right arm/hand pain. Went to UC about 1 month ago.  Unable to sleep last night d/t pain. Is Patient Diabetic? No Pain Assessment Patient in pain? yes     Location: Right loweer arm/hand Intensity: 8 Type: aching Onset of pain  Intermittent Nutritional Status BMI of > 30 = obese  Have you ever been in a relationship where you felt threatened, hurt or afraid?No   Does patient need assistance? Functional Status Self care Ambulation Normal Comments Brace on right wrist/hand.   Primary Care Provider:  Chauncey Reading DO  CC:  Right arm/hand pain. Went to UC about 1 month ago.  Unable to sleep last night d/t pain.Marland Kitchen  History of Present Illness: Mr. Kwan is a 65 yo M with R wrist pain 2/2 arthritis who presents with the same complaints. He says for the past month, his wrist pain has gotten significantly worse. He is still using a splint that was given to him by urgent care in January and Ibuprofen 800 mg but says they only help a little bit. The pain hurts worst when he moves his thumb and travels up his forearm. He also notes some tingling of all his fingertips in the R hand. Wrist x-ray on 02/05/09 showed degenerative changes at base of R thumb.   Depression History:      The patient denies a depressed mood most of the day and a diminished interest in his usual daily activities.         Preventive Screening-Counseling & Management  Alcohol-Tobacco     Alcohol type: occasional     Smoking Status: never  Caffeine-Diet-Exercise     Caffeine use/day: 1     Does Patient Exercise: yes     Type of exercise:  walking,exercise program     Times/week: 3  Current Medications (verified): 1)  Aspirin 81 Mg Tbec (Aspirin) .... Take 1 Tablet By Mouth Once A Day 2)  Carvedilol 12.5 Mg  Tabs (Carvedilol) .... Take 1 Tablet By Mouth Two Times A Day 3)  Imdur 60 Mg Tb24 (Isosorbide Mononitrate) .... Take 1 Tablet By Mouth Once A Day 4)  Klor-Con 10 10 Meq Tbcr (Potassium Chloride) .... Take 1 Tablet By Mouth Once A Day 5)  Lisinopril 40 Mg Tabs (Lisinopril) .... Take 1 Tablet By Mouth Once A Day 6)  Hydrochlorothiazide 25 Mg Tabs (Hydrochlorothiazide) .... Take 1 Tablet By Mouth Once A Day 7)  Pravastatin Sodium 80 Mg  Tabs (Pravastatin Sodium) .... Take 1 Tablet By Mouth At Bedtime 8)  Nitroquick 0.4 Mg  Subl (Nitroglycerin) .... Take One Pill Under Your Tongue Every 5 Minutes Up To Three Times For Chest Pain 9)  Niacin 500 Mg Tabs (Niacin) .... Take 1 Tablet By Mouth Two Times A Day.  Take An 81mg  Aspirin 30 Minutes Before Taking This Medication To Prevent A Possible Skin Flushing Reaction. 10)  Loratadine 10 Mg Tabs (Loratadine) .... Take 1 Tablet By Mouth Once A Day For Allergies and Post  Nasal Drip 11)  Prilosec 20 Mg Cpdr (Omeprazole) .... Take 1 Tablet By Mouth Daily  Allergies (verified): No Known Drug Allergies  Past History:  Past Medical History: Last updated: 04/05/2008 Hx of VDRF after cervical spine surgery 10/09 PEPTIC ULCER DISEASE (ICD-533.90) INADEQUATE MATERIAL RESOURCES (ICD-V60.2) SPINAL STENOSIS, CERVICAL (ICD-723.0) ERECTILE DYSFUNCTION (ICD-302.72) DENTAL CARIES (ICD-521.00) GLUCOSE INTOLERANCE (ICD-271.3) NUMBNESS, HAND (ICD-782.0) POLYURIA (ICD-788.42) SHOULDER PAIN, RIGHT (ICD-719.41) ABSCESS (ICD-682.9) SKIN TAG (ICD-701.9) OBESITY (ICD-278.00) FATIGUE (ICD-780.79) HYPERTENSION (ICD-401.9) HYPERLIPIDEMIA (ICD-272.4) THYROIDECTOMY, HX OF (ICD-V45.79) LEG PAIN, CHRONIC (ICD-729.5) DISC DISEASE, LUMBOSACRAL SPINE (ICD-722.52) STENOSIS, LUMBAR SPINE  (ICD-724.02) DYSPNEA (ICD-786.05) CLAUDICATION (ICD-443.9) GOITER (ICD-240.9) RETINAL HEMORRHAGE (ICD-362.81) CATARACT, LEFT EYE (ICD-366.9) SLEEP APNEA, OBSTRUCTIVE (ICD-327.23) ANGINA PECTORIS (ICD-413.9) ARTHRITIS, HANDS, 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  Inguinal herniorrhaphy  Family History: Last updated: 04/05/2008 The patient's mother is deceased at the age of 75 with  myocardial infarction.  Father died at unknown age with a myocardial  infarction.  He has a sister that has had a bypass and brother with a  myocardial infarction  Social History: Last updated: 04/05/2008 Disabled (CABG) Never Smoked Alcohol use-no Drug use-no Regular exercise-yes Divorced   Risk Factors: Caffeine Use: 1 (04/09/2009) Exercise: yes (04/09/2009)  Risk Factors: Smoking Status: never (04/09/2009)  Review of Systems      See HPI  Physical Exam  General:  Well-developed,well-nourished,in no acute distress; alert,appropriate and cooperative throughout examination Head:  Normocephalic and atraumatic without obvious abnormalities. No apparent alopecia or balding. Lungs:  Normal respiratory effort, chest expands symmetrically. Lungs are clear to auscultation, no crackles or wheezes. Heart:  Distant heart sounds. Normal rate and regular rhythm. S1 and S2 normal without gallop, murmur, click, rub or other extra sounds. Msk:  R wrist tenderness to palpation and mild swelling in the joint. Full ROM but pain during flexion and extension and with thumb movement. Neurologic:  alert & oriented X3.   Psych:  Cognition and judgment appear intact. Alert and cooperative with normal attention span and  concentration. 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 He would likely benefit from a steroid injection of the joint. Will refer him to sports medicine to have his pain evaluated and potentially treated with injection. He will call the clinic and let us know if their treatment does not help.  Orders: Sports Medicine (Sports Med)  Complete Medication List: 1)  Aspirin 81 Mg Tbec (Aspirin) .... Take 1 tablet by mouth once a day 2)  Carvedilol 12.5 Mg Tabs (Carvedilol) .... Take 1 tablet by mouth two times a day 3)  Imdur 60 Mg Tb24 (Isosorbide mononitrate) .... Take 1 tablet by mouth once a day 4)  Klor-con 10 10 Meq Tbcr (Potassium chloride) .... Take 1 tablet by mouth once a day 5)  Lisinopril 40 Mg Tabs (Lisinopril) .... Take 1 tablet by mouth once a day 6)  Hydrochlorothiazide 25 Mg Tabs (Hydrochlorothiazide) .... Take 1 tablet by mouth once a day 7)  Pravastatin Sodium 80 Mg Tabs (Pravastatin sodium) .... Take 1 tablet by mouth at bedtime 8)  Nitroquick 0.4 Mg Subl (Nitroglycerin) .... Take one pill under your tongue every 5  minutes up to three times for chest pain 9)  Niacin 500 Mg Tabs (Niacin) .... Take 1 tablet by mouth two times a day.  take an 81mg  aspirin 30 minutes before taking this medication to prevent a possible skin flushing reaction. 10)  Loratadine 10 Mg Tabs (Loratadine) .... Take 1 tablet by mouth once a day for allergies and post nasal drip 11)  Prilosec 20 Mg Cpdr (Omeprazole) .... Take 1 tablet by mouth daily  Patient Instructions: 1)  Please schedule a follow-up appointment in 3 months. 2)  I have referred you to the Sports Medicine clinic to have your wrist pain evaluated.   Prevention & Chronic Care Immunizations   Influenza vaccine: Fluvax MCR  (11/01/2008)    Tetanus  booster: Not documented    Pneumococcal vaccine: Not documented    H. zoster vaccine: Not documented  Colorectal Screening   Hemoccult: Not documented    Colonoscopy: Results: Polyp.  Pathology:  Adenomatous polyp.          (11/05/2006)   Colonoscopy action/deferral: Repeat colonoscopy in 3 years.     (11/05/2006)   Colonoscopy due: 11/2009  Other Screening   PSA: Not documented   Smoking status: never  (04/09/2009)  Lipids   Total Cholesterol: 164  (02/03/2008)   LDL: 105  (02/03/2008)   LDL Direct: Not documented   HDL: 38  (02/03/2008)   Triglycerides: 104  (02/03/2008)    SGOT (AST): 22  (11/25/2006)   SGPT (ALT): 22  (11/25/2006)   Alkaline phosphatase: 59  (11/25/2006)   Total bilirubin: 0.5  (11/25/2006)  Hypertension   Last Blood Pressure: 146 / 83  (04/09/2009)   Serum creatinine: 1.04  (02/03/2008)   Serum potassium 3.7  (02/03/2008)  Self-Management Support :    Patient will work on the following items until the next clinic visit to reach self-care goals:     Medications and monitoring: bring all of my medications to every visit  (04/09/2009)     Eating: drink diet soda or water instead of juice or soda, eat baked foods instead of fried foods  (04/09/2009)     Activity: take a 30 minute walk every day  (04/09/2009)    Hypertension self-management support: Education handout, Resources for patients handout, Written self-care plan  (04/09/2009)   Hypertension self-care plan printed.   Hypertension education handout printed    Lipid self-management support: Education handout, Resources for patients handout, Written self-care plan  (04/09/2009)   Lipid self-care plan printed.   Lipid education handout printed      Resource handout printed.     Appended Document: Orders Update    Clinical Lists Changes  Orders: Added new Referral order of Dental Referral (Dentist) - Signed

## 2010-02-12 NOTE — Progress Notes (Signed)
Summary: Refill/gh  Phone Note Refill Request Message from:  Fax from Pharmacy on May 23, 2009 9:36 AM  Refills Requested: Medication #1:  PRILOSEC 20 MG CPDR Take 1 tablet by mouth daily  Method Requested: Electronic Initial call taken by: Angelina Ok RN,  May 23, 2009 9:37 AM  Follow-up for Phone Call       Follow-up by: Silvestre Gunner MD,  May 23, 2009 10:25 PM    Prescriptions: PRILOSEC 20 MG CPDR (OMEPRAZOLE) Take 1 tablet by mouth daily  #30 x 6   Entered and Authorized by:   Silvestre Gunner MD   Signed by:   Silvestre Gunner MD on 05/23/2009   Method used:   Electronically to        [facility]       [address]       [place], [place]  [address]       Ph: [phone]       Fax: [phone]   RxID:   [id number]

## 2010-02-12 NOTE — Progress Notes (Signed)
Summary: refill/ hla  Phone Note Refill Request Message from:  Fax from Pharmacy on September 25, 2009 9:53 AM  Refills Requested: Medication #1:  CARVEDILOL 12.5 MG  TABS Take 1 tablet by mouth two times a day   Dosage confirmed as above?Dosage Confirmed   Last Refilled: 7/6 last visit 03/2009, last labs 01/2008  Initial call taken by: Marin Roberts RN,  September 25, 2009 9:53 AM  Follow-up for Phone Call        Refilled electronically.  Follow-up by: Margarito Liner MD,  September 25, 2009 11:07 AM    Prescriptions: CARVEDILOL 12.5 MG  TABS (CARVEDILOL) Take 1 tablet by mouth two times a day  #60 Tablet x 3   Entered and Authorized by:   Margarito Liner MD   Signed by:   Margarito Liner MD on 09/25/2009   Method used:   Electronically to        [facility]       [address]       [place], [place]  [address]       Ph: [phone]       Fax: [phone]   RxID:   [id number]

## 2010-02-13 DIAGNOSIS — R079 Chest pain, unspecified: Secondary | ICD-10-CM

## 2010-02-13 LAB — BASIC METABOLIC PANEL
BUN: 7 mg/dL (ref 6–23)
CO2: 30 mEq/L (ref 19–32)
Chloride: 102 mEq/L (ref 96–112)
Creatinine, Ser: 0.97 mg/dL (ref 0.4–1.5)
Potassium: 3.2 mEq/L — ABNORMAL LOW (ref 3.5–5.1)

## 2010-02-13 LAB — CARDIAC PANEL(CRET KIN+CKTOT+MB+TROPI)
Relative Index: 1.2 (ref 0.0–2.5)
Total CK: 122 U/L (ref 7–232)
Troponin I: 0.01 ng/mL (ref 0.00–0.06)

## 2010-02-13 LAB — LIPID PANEL
Cholesterol: 159 mg/dL (ref 0–200)
HDL: 32 mg/dL — ABNORMAL LOW (ref >39)
Total CHOL/HDL Ratio: 5 RATIO
VLDL: 21 mg/dL (ref 0–40)

## 2010-02-13 LAB — TSH: TSH: 2.247 u[IU]/mL (ref 0.350–4.500)

## 2010-02-13 LAB — HEMOGLOBIN A1C
Hgb A1c MFr Bld: 6.1 % — ABNORMAL HIGH (ref <5.7)
Mean Plasma Glucose: 128 mg/dL — ABNORMAL HIGH (ref <117)

## 2010-02-14 NOTE — Miscellaneous (Signed)
Summary: Orders Update  Clinical Lists Changes  Electronic refill request received for pravastatin and HCTZ; no labs since January 2010.  Patient has appointment 01/23/2010 with Dr. Allena Katz.  I approved refills; please have patient come in for fasting labs within 1 week. Margarito Liner MD  December 13, 2009 2:38 PM    Orders: Added new Test order of T-CMP with Estimated GFR ([id number]) - Signed Added new Test order of T-Lipid Profile [id number]) - Signed     Process Orders Order queued for requisitioning for [facility]: December 13, 2009 2:40 PM Tests Sent for requisitioning (January 03, 2010 3:17 PM):     12/14/2009: [facility] -- T-CMP with Estimated GFR [[id number]] (signed)     12/14/2009: [facility] -- T-Lipid Profile [id number] (signed)

## 2010-02-15 ENCOUNTER — Encounter: Payer: Self-pay | Admitting: Family Medicine

## 2010-02-15 ENCOUNTER — Ambulatory Visit (INDEPENDENT_AMBULATORY_CARE_PROVIDER_SITE_OTHER): Payer: MEDICARE | Admitting: Family Medicine

## 2010-02-15 DIAGNOSIS — M67919 Unspecified disorder of synovium and tendon, unspecified shoulder: Secondary | ICD-10-CM | POA: Insufficient documentation

## 2010-02-15 DIAGNOSIS — M719 Bursopathy, unspecified: Secondary | ICD-10-CM | POA: Insufficient documentation

## 2010-02-20 NOTE — Assessment & Plan Note (Signed)
Summary: Hospital Admission  INTERNAL MEDICINE ADMISSION HISTORY AND PHYSICAL  ***Place in front of Progress Notes Section of chart***  Attending: Dr. Josem Kaufmann 1st conact: Dr. Narda Bonds [phone] 2nd contact: Dr. Gilford Rile [phone]  Weekends, [place], After 5pm Weekdays: 1st contact: [phone] 2nd contact: [phone]  PCP: Dr. Allena Katz Cards: Dr. Daleen Squibb (LB)  CC: Chest pain, left shoulder pain  HPI: Pt is a 65 y/o M with PMH of CAD s/p 3 vessel CABG, HTN, HLD who presents with a 2 week hx of intermittent chest and shoulder pain.  He describes his chest pain as a burning sensation in his mid chest that is associated with SOB and DOE.  He reports taking NTG 1-2 days PTA with mild relief of his symptoms and reports the burning sensation occasionally seems worse after eating.  He denies any associated n/v or diaphoresis.  He also reports left sided shoulder pain that radiates down his left arm with associating tingling.  He tried taking otc tylenol at home with minimal symptom relief.  He believes his chest pain is associated with his shoulder pain. He admits to some limited ROM 2/2 pain and notes that his shoulder pain is worse when he lays on his left side.  He does not describe any other alleviating, aggravating, or associated factors.  He denies any weakness, fever, chills, syncope, h/a, diarrhea, or other complaint.    ALLERGIES: NKDA  PAST MEDICAL HISTORY: PEPTIC ULCER DISEASE GERD SPINAL STENOSIS, CERVICAL       - s/p C3-C5 anterior diskectomy 10/2007.  Sugery complicated by VDRF ERECTILE DYSFUNCTION  HYPERTENSION  HYPERLIPIDEMIA MULTINODULAR GOITER    s/p  THYROIDECTOMY in 2006 LEG PAIN, CHRONIC DISC DISEASE, LUMBOSACRAL SPINE STENOSIS, LUMBAR SPINE CLAUDICATION  GOITER  RETINAL HEMORRHAGE CATARACT, LEFT EYE  SLEEP APNEA, OBSTRUCTIVE  ANGINA PECTORIS ARTHRITIS, HANDS, BILATERAL  CORONARY ARTERY DISEASE     -s/p 3 vessel CABG at [facility] in 1999    - 2D echo (11/2006): normal LV  function, EF 55-60%    - Cardiac cath (03/2006): severe 3 vessel native disease, patent grafts, LV-gram revealed global hypokinesis with EF 60%      MEDICATIONS: ASPIRIN 81 MG TBEC (ASPIRIN) Take 1 tablet by mouth once a day CARVEDILOL 12.5 MG  TABS (CARVEDILOL) Take 1 tablet by mouth two times a day IMDUR 60 MG TB24 (ISOSORBIDE MONONITRATE) Take 1 tablet by mouth once a day KLOR-CON 10 10 MEQ TBCR (POTASSIUM CHLORIDE) Take 1 tablet by mouth once a day LISINOPRIL 40 MG TABS (LISINOPRIL) Take 1 tablet by mouth once a day HYDROCHLOROTHIAZIDE 25 MG TABS (HYDROCHLOROTHIAZIDE) Take 1 tablet by mouth once a day PRAVASTATIN SODIUM 80 MG  TABS (PRAVASTATIN SODIUM) Take 1 tablet by mouth at bedtime NITROQUICK 0.4 MG  SUBL (NITROGLYCERIN) Take one pill under your tongue every 5 minutes up to three times for chest pain NIACIN 500 MG TABS (NIACIN) Take 1 tablet by mouth two times a day.  Take an 81mg  aspirin 30 minutes before taking this medication to prevent a possible skin flushing reaction. LORATADINE 10 MG TABS (LORATADINE) Take 1 tablet by mouth once a day for allergies and post nasal drip PRILOSEC 20 MG CPDR (OMEPRAZOLE) Take 1 tablet by mouth daily VICODIN 5-500 MG TABS (HYDROCODONE-ACETAMINOPHEN) 1-2 by mouth at bedtime as needed wrist pain   SOCIAL HISTORY:  Disabled (CABG) Never Smoked Alcohol use-no Drug use-no Regular exercise-yes Divorced    FAMILY HISTORY The patient's mother is deceased at the age of 11 withmyocardial infarction.  Father  died at unknown age with a myocardial  infarction.  He has a sister that has had a bypass and brother with a myocardial infarction   ROS: as per HPI, all other systems reviewed and negative   VITALS: T: 98.3 P: 70  BP: 155/81  R: 18  O2SAT: 98% ON: RA  PHYSICAL EXAM: General:  alert, well-developed, NAD, cooperative, A&Ox3 Head:  normocephalic and atraumatic.   Eyes:  PERRLA, EOMI, vision grossly intact, conjuctive and sclerae within normal  limits.   Mouth:  MMM, no erythema, no exudates, or lesions.   Neck:  supple, full ROM, trachea midline, no palp masses, no JVD, no carotid bruits.   Lungs:  CTAB, normal respiratory effort  Heart: RRR, no M/R/G Abdomen:  soft, NT, ND, BS present and normoactive, no palpable masses  Msk:  no joint swelling, warmth, or erythema  Neurologic:  CN II-XII intact,+5 strength globally, sensation grossly intact,  Skin:  turgor normal and no rashes.   Psych: memory intact for recent and remote, normally interactive, good eye contact, affect as expected  LABS:  WBC                                      4.0               4.0-10.5         K/uL  RBC                                      4.88              4.22-5.81        MIL/uL  Hemoglobin (HGB)                         13.2              13.0-17.0        g/dL  Hematocrit (HCT)                         38.1       l      39.0-52.0        %  MCV                                      78.1              78.0-100.0       fL  MCH -                                    27.0              26.0-34.0        pg  MCHC                                     34.6              30.0-36.0        g/dL  RDW  13.5              11.5-15.5        %  Platelet Count (PLT)                     169               150-400          K/uL    Sodium (NA)                              137               135-145          mEq/L  Potassium (K)                            3.5               3.5-5.1          mEq/L  Chloride                                 101               96-112           mEq/L  CO2                                      30                19-32            mEq/L  Glucose                                  92                70-99            mg/dL  BUN                                      9                 6-23             mg/dL  Creatinine                               0.89              0.4-1.5          mg/dL  GFR, Est Non African American            >60                >60              mL/min  GFR, Est African American                >60               >60  mL/min    Oversized comment, see footnote  1  Bilirubin, Total                         0.4               0.3-1.2          mg/dL  Alkaline Phosphatase                     57                39-117           U/L  SGOT (AST)                               18                0-37             U/L  SGPT (ALT)                               16                0-53             U/L  Total  Protein                           7.1               6.0-8.3          g/dL  Albumin-Blood                            3.5               3.5-5.2          g/dL  Calcium                                  8.7               8.4-10.5         mg/dL   Creatine Kinase, Total                   116               7-232            U/L  CK, MB                                   1.2               0.3-4.0          ng/mL  Relative Index                           1.0               0.0-2.5   Troponin I                               <  0.01             0.00-0.06        ng/mL  CXR 2 view:  Stable borderline to mild cardiomegaly.  No acute cardiopulmonary disease.  Left shoulder 2 view x-ray:  No acute or subacute osseous abnormality.  Mild degenerative changes in the [facility] joint.  Calcific infraspinatus tendonitis.  ASSESSMENT AND PLAN: (1) Chest pain: differntial inlcludes ACS, PE, esophageal rupture, GERD/PUD.  There is no evidence of PNA or PTX on CXR, nor is there any evidence of medistinal widening to suggest aortic dissection.  Pt is not tachypnic, tachycardic, is without findings to suggest LE DVT on exam and is low prob for PE - will not work up further for this.  Esophageal rupture is unlikey given pts presentation.  Mose likely etiologies are ACS vs GERD/PUD.  His EKG reveals NSR with non-specific ST changes throughout the lateral leads - Admit to tele - cycle cardiac enzmyes  - repeat EKG on arrival to floor - continue beta  blocker, asa, imdur - continue PPI for GERD/PUD - will check TSH, FLP, HbA1c  (2)HTN - will continue home meds  (3)HLD - will continue pravachol - will check FLP in am  (4) Left shoulder pain Pt is without any neurological deficit on exam.  X-ray obtained in the ER reveals tendonitis and degenerative changes.  Pt follows with [facility], will establish out pt f/u appt for him on d/c home. - vicodin for pain control  ()VTE PROPH: lovenox

## 2010-02-20 NOTE — Progress Notes (Signed)
Summary: Shoulder Pain  Phone Note Call from Patient   Caller: Patient Call For: Lyn Hollingshead Summary of Call: Has been having problems with his shoulders.  Shoulder has been hurting really bad.  Can hardly lift it up.   Pain medication is not stopping the pain.  Pt was advised to got to the ED since he had had problems with his heart before.Angelina Ok RN  February 12, 2010 3:24 PM  Initial call taken by: Angelina Ok RN,  February 12, 2010 3:22 PM

## 2010-02-28 NOTE — Assessment & Plan Note (Signed)
Summary: LEFT SHOULDER PAIN/TENDONITIS/F/U/LP   Vital Signs:  Patient profile:   65 year old male BP sitting:   168 / 106  Vitals Entered By: Lillia Pauls CMA (February 15, 2010 11:14 AM)  Primary Care Provider:  Chauncey Reading DO   History of Present Illness: 65 yo male coming in with hx of left shoulder pain for 2 months.  Pt denies any trauma or injury to the shoulder, seems to be chronically getting worse.  Pt states the pain is constant seems to be worse with certain movement does not know which ones.  Pt denies any radiating pain no numbness or loss of sensation or swelling but states has chronic pain. Pt is interested in a possible injection.  Pt recently admitted to the hospital with atypical chest pain found to have shoulder pain and x ray showed calcinosis of the infraspinatus tendon and was sent here for care.   Current Medications (verified): 1)  Aspirin 81 Mg Tbec (Aspirin) .... Take 1 Tablet By Mouth Once A Day 2)  Carvedilol 12.5 Mg  Tabs (Carvedilol) .... Take 1 Tablet By Mouth Two Times A Day 3)  Imdur 60 Mg Tb24 (Isosorbide Mononitrate) .... Take 1 Tablet By Mouth Once A Day 4)  Klor-Con 10 10 Meq Tbcr (Potassium Chloride) .... Take 1 Tablet By Mouth Once A Day 5)  Lisinopril 40 Mg Tabs (Lisinopril) .... Take 1 Tablet By Mouth Once A Day 6)  Hydrochlorothiazide 25 Mg Tabs (Hydrochlorothiazide) .... Take 1 Tablet By Mouth Once A Day 7)  Pravastatin Sodium 80 Mg  Tabs (Pravastatin Sodium) .... Take 1 Tablet By Mouth At Bedtime 8)  Nitroquick 0.4 Mg  Subl (Nitroglycerin) .... Take One Pill Under Your Tongue Every 5 Minutes Up To Three Times For Chest Pain 9)  Niacin 500 Mg Tabs (Niacin) .... Take 1 Tablet By Mouth Two Times A Day.  Take An 81mg  Aspirin 30 Minutes Before Taking This Medication To Prevent A Possible Skin Flushing Reaction. 10)  Loratadine 10 Mg Tabs (Loratadine) .... Take 1 Tablet By Mouth Once A Day For Allergies and Post Nasal Drip 11)  Prilosec 20 Mg Cpdr  (Omeprazole) .... Take 1 Tablet By Mouth Daily 12)  Vicodin 5-500 Mg Tabs (Hydrocodone-Acetaminophen) .Marland Kitchen.. 1-2 By Mouth At Bedtime As Needed Wrist Pain  Allergies (verified): No Known Drug Allergies  Past History:  Past medical, surgical, family and social histories (including risk factors) reviewed, and no changes noted (except as noted below).  Past Medical History: Reviewed history from 04/05/2008 and no changes required. Hx of VDRF after cervical spine surgery 10/09 PEPTIC ULCER DISEASE (ICD-533.90) INADEQUATE MATERIAL RESOURCES (ICD-V60.2) SPINAL STENOSIS, CERVICAL (ICD-723.0) ERECTILE DYSFUNCTION (ICD-302.72) DENTAL CARIES (ICD-521.00) GLUCOSE INTOLERANCE (ICD-271.3) NUMBNESS, HAND (ICD-782.0) POLYURIA (ICD-788.42) SHOULDER PAIN, RIGHT (ICD-719.41) ABSCESS (ICD-682.9) SKIN TAG (ICD-701.9) OBESITY (ICD-278.00) FATIGUE (ICD-780.79) HYPERTENSION (ICD-401.9) HYPERLIPIDEMIA (ICD-272.4) THYROIDECTOMY, HX OF (ICD-V45.79) LEG PAIN, CHRONIC (ICD-729.5) DISC DISEASE, LUMBOSACRAL SPINE (ICD-722.52) STENOSIS, LUMBAR SPINE (ICD-724.02) DYSPNEA (ICD-786.05) CLAUDICATION (ICD-443.9) GOITER (ICD-240.9) RETINAL HEMORRHAGE (ICD-362.81) CATARACT, LEFT EYE (ICD-366.9) SLEEP APNEA, OBSTRUCTIVE (ICD-327.23) ANGINA PECTORIS (ICD-413.9) ARTHRITIS, HANDS, BILATERAL (ICD-716.94) GERD (ICD-530.81) CORONARY ARTERY DISEASE (ICD-414.00)    Past Surgical History: Reviewed history from 04/05/2008 and no changes required. 3 vessel CABG 1999 Goiter removal 2006 Percutaneous transluminal coronary angiography, patent grafts 3/07 Cataract, right eye, removed 8/09 C3-4, C4-5 cervical diskectomy 10/09 Thyroid mass resection.  Inguinal herniorrhaphy  Family History: Reviewed history from 04/05/2008 and no changes required. The patient's mother is deceased at the age of 59  with  myocardial infarction.  Father died at unknown age with a myocardial  infarction.  He has a sister that has had a  bypass and brother with a  myocardial infarction  Social History: Reviewed history from 04/05/2008 and no changes required. Disabled (CABG) Never Smoked Alcohol use-no Drug use-no Regular exercise-yes Divorced   Review of Systems       denis fever, chills, nausea, vomiting, diarrhea or constipation see hpi  Physical Exam  General:  alert.  pleasant Msk:  left shoulder mild decrease in AROM but full passive ROM Neg hawkins, and neers + empty can full strength in internal and external rotation NVI no swelling or edema, mild + apprehension test.    Additional Exam:  Patient given informed consent for injection. Discussed possible complications of infection, bleeding or skin atrophy at site of injection. Possible side effect of avascular necrosis (focal area of bone death) due to steroid use.Appropriate verbal time out taken Are cleaned and prepped in usual sterile fashion. A -1--- cc kennalog plus ---4-cc 1% lidocaine without epinephrine was injected into the--subacromial bursa. Patient tolerated procedure well with no complications.    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  discussed RCS. Injection today and HEP rtc 4-6 w if not improving.  Complete Medication List: 1)  Aspirin 81 Mg Tbec (Aspirin) .... Take 1 tablet by mouth once a day 2)  Carvedilol 12.5 Mg Tabs (Carvedilol) .... Take 1 tablet by mouth two times a day 3)  Imdur 60 Mg Tb24 (Isosorbide mononitrate) .... Take 1 tablet by mouth once a day 4)  Klor-con 10 10 Meq Tbcr (Potassium chloride) .... Take 1 tablet by mouth once a day 5)  Lisinopril 40 Mg Tabs (Lisinopril) .... Take 1 tablet by mouth once a day 6)  Hydrochlorothiazide 25 Mg Tabs (Hydrochlorothiazide) .... Take 1 tablet by mouth once a day 7)  Pravastatin Sodium 80 Mg Tabs (Pravastatin sodium) .... Take 1 tablet by mouth at bedtime 8)  Nitroquick 0.4 Mg Subl (Nitroglycerin) .... Take  one pill under your tongue every 5 minutes up to three times for chest pain 9)  Niacin 500 Mg Tabs (Niacin) .... Take 1 tablet by mouth two times a day.  take an 81mg  aspirin 30 minutes before taking this medication to prevent a possible skin flushing reaction. 10)  Loratadine 10 Mg Tabs (Loratadine) .... Take 1 tablet by mouth once a day for allergies and post nasal drip 11)  Prilosec 20 Mg Cpdr (Omeprazole) .... Take 1 tablet by mouth daily 12)  Vicodin 5-500 Mg Tabs (Hydrocodone-acetaminophen) .Marland Kitchen.. 1-2 by mouth at bedtime as needed wrist pain Joint Aspirate / Injection, Large (20610) Kenalog 10 mg inj (Z6109)   Orders Added: 1)  Est. Patient Level III [60454] 2)  Joint Aspirate / Injection, Large [20610] 3)  Kenalog 10 mg inj [J3301]     Impression & Recommendations:  Orders: Joint Aspirate / Injection, Large (09811) Kenalog 10 mg inj (J3301)   Complete Medication List: 1)  Aspirin 81 Mg Tbec (Aspirin) .... Take 1 tablet by mouth once a day 2)  Carvedilol 12.5 Mg Tabs (Carvedilol) .... Take 1 tablet by mouth two times a day 3)  Imdur 60 Mg Tb24 (Isosorbide mononitrate) .... Take 1 tablet by mouth once a day 4)  Klor-con 10 10 Meq Tbcr (Potassium chloride) .... Take 1 tablet by mouth once a day 5)  Lisinopril 40 Mg Tabs (Lisinopril) .... Take 1  tablet by mouth once a day 6)  Hydrochlorothiazide 25 Mg Tabs (Hydrochlorothiazide) .... Take 1 tablet by mouth once a day 7)  Pravastatin Sodium 80 Mg Tabs (Pravastatin sodium) .... Take 1 tablet by mouth at bedtime 8)  Nitroquick 0.4 Mg Subl (Nitroglycerin) .... Take one pill under your tongue every 5 minutes up to three times for chest pain 9)  Niacin 500 Mg Tabs (Niacin) .... Take 1 tablet by mouth two times a day.  take an 81mg  aspirin 30 minutes before taking this medication to prevent a possible skin flushing reaction. 10)  Loratadine 10 Mg Tabs (Loratadine) .... Take 1 tablet by mouth once a day for allergies and post nasal  drip 11)  Prilosec 20 Mg Cpdr (Omeprazole) .... Take 1 tablet by mouth daily 12)  Vicodin 5-500 Mg Tabs (Hydrocodone-acetaminophen) .Marland Kitchen.. 1-2 by mouth at bedtime as needed wrist pain

## 2010-03-02 NOTE — Discharge Summary (Signed)
NAME:  William [name], William [name] NO.:  [id number]  MEDICAL RECORD NO.:  [id number]           PATIENT TYPE:  I  LOCATION:  4714                         FACILITY:  [facility]  PHYSICIAN:  Doneen Poisson, MD     DATE OF BIRTH:  06-18-1945  DATE OF ADMISSION:  02/12/2010 DATE OF DISCHARGE:  02/13/2010                              DISCHARGE SUMMARY   DISCHARGE DIAGNOSES: 1. Atypical chest pain. 2. Hypertension. 3. Hyperlipidemia. 4. Left shoulder tendonitis. 5. Peptic ulcer disease/gastroesophageal reflux disease. 6. Cervical spinal stenosis. 7. Erectile dysfunction. 8. Lumbar spine stenosis. 9. Obstructive sleep apnea. 10.Osteoarthritis of the hands. 11.Coronary artery disease status post 3-vessel CABG in 1999.  DISCHARGE MEDICATIONS: 1. Meloxicam 7.5 mg by mouth daily as needed for shoulder pain. 2. Protonix 40 mg by mouth daily. 3. Aspirin 81 mg by mouth daily. 4. Carvedilol 12.5 mg by mouth twice daily. 5. Hydrochlorothiazide 25 mg by mouth daily. 6. Imdur 60 mg by mouth daily. 7. Lisinopril 40 mg by mouth daily. 8. Loratadine 10 mg by mouth daily. 9. Niacin 500 mg by mouth twice daily. 10.Potassium chloride 10 mEq by mouth daily. 11.Pravastatin 80 mg by mouth daily.  DISPOSITION AND FOLLOWUP: 1. William [name] was discharged from the [facility] on February 13, 2010 in stable and improved condition.  His chest pain and     shoulder pain had resolved.  He was to take meloxicam once daily as     needed for his shoulder pain.  He was given a 2-week prescription     of this and instructed to continue taking Protonix in particular     while he was on the meloxicam medication.  [facility]     [facility] he has been seen before with Dr.     Jennette Kettle on February 3 at 11:30 a.m.Marland Kitchen  At that time, his left shoulder     will need to be evaluated and he may be a candidate for a steroid     injection.  2. William [name] should follow up with  Dr. Lyn Hollingshead at the [facility] on February 22 at 1:15 p.m.  At that     time, he will need a repeat BMET to check his potassium.       Reassessment to assure he is not having trouble with reflux or      peptic ulcer disease given his recent NSAID use is necessary.  CONSULTATIONS:  None.  PROCEDURES PERFORMED: 1. Two-view chest x-ray February 12, 2010 showed stable borderline to     mild cardiomegaly with no acute cardiopulmonary disease.  2. Left shoulder 2-view on February 12, 2010 showed no acute or     subacute osseous abnormality, calcific infraspinatus tendonitis.  ADMISSION HISTORY:  William [name] is a 65 year old man with a past medical history of coronary artery disease status post 3-vessel CABG, hypertension, hyperlipidemia who presents with a 2-week history of intermittent chest and shoulder pain.  He describes his chest pain as a burning sensation  in his mid chest.  It was associated with shortness of breath and dyspnea on exertion.  He reports taking nitroglycerin 1-2 days prior to admission with mild relief of his symptoms and reports a burning sensation occasionally, worse after eating.  He denies any associated nausea, vomiting, or diaphoresis.  He also reports left-sided shoulder pain that radiates down his left arm with associated tingling.  He tried taking over-the-counter Tylenol at home with minimal symptom relief.  He believes his chest pain is associated with his shoulder pain.  He admits to some limited range of motion secondary to pain and his shoulder pain is worsened on the left side.  He does not describe any other alleviating, aggravating, or associated factors and denies any weakness, fevers, chills, syncope, headache, diarrhea, or other complaints.  ADMISSION PHYSICAL EXAMINATION: VITALS:  Temperature 98.3, pulse 70, blood pressure 155/81, respiratory  rate 18, O2 sat 98% on room air. GENERAL:  Alert, well developed, no  acute distress, cooperative, alert and oriented x 3. HEAD:  Normocephalic and atraumatic. EYES:  Pupils are equal, round, and reactive to light and accommodation. Extraocular movements are intact.  Vision grossly intact.  Conjunctivae and sclerae within normal limits. MOUTH:  Mucous membranes are moist, no erythema, no exudates, no lesions. NECK:  Supple, full range of motion.  Trachea midline.  No palpable masses, JVD or carotid bruits. LUNGS:  Clear to auscultation bilaterally.  Normal respiratory effort. HEART:  Regular rate and rhythm.  No murmurs, rubs, or gallops. ABDOMEN:  Soft, nontender, nondistended.  Bowel sounds present. Normoactive, no palpable masses. MUSCULOSKELETAL:  No joint swelling, warmth, or erythema. Some limited  range of motion of left shoulder with discomfort. NEUROLOGIC:  Cranial nerves II through XII intact, 5+ strength globally. Sensation grossly intact. SKIN:  Turgor normal.  No rashes. PSYCH:  Memory intact for recent, remote, normal interactive, and good eye contact.  Affect is as expected.  ADMISSION LABS:  White blood cell count 4.0, hemoglobin 13.2, platelets  169.  Sodium 137, potassium 3.5, chloride 101, bicarb 30, glucose 92,  BUN 9, creatinine 0.89.  LFTs normal.  Calcium 8.7.  Cardiac enzymes  negative x 1.  HOSPITAL COURSE BY PROBLEM: 1. Atypical chest pain.  William [name] was admitted for chest pain.     This was concerning given his significant history of coronary     artery disease as he is status post CABG and last cath was in 2008.     On that cath, he had severe 3-vessel coronary disease with patent     grafts.  His presentation of chest pain seemed to be atypical and     initial cardiac enzymes were normal.  He had EKG which showed     no change from prior EKGs.  I spoke with his cardiologist, Dr. Daleen Squibb     of [facility] about his presentation and Dr. Daleen Squibb     agreed this could be an atypical presentation.  William [name]      responded very well to treatment of his chest pain with Protonix     and then treatment of his left shoulder pain with NSAIDs.  Dr. Daleen Squibb     agreed that further work up for potential cardiac cause of his chest     pain was unnecessary as with further laboratory, he proved     to have cardiac enzymes negative x 3 and no recurrence of his chest     pain during the admission.  He is  to follow up with the outpatient     clinic in approximately 3 weeks with his primary care provider.  At     that time if he has experienced any new chest pain, it     would be prudent to pursue further work up including potentially a     Myoview.  During hospitalization and on discharge, his     beta-blocker, aspirin, and Imdur were continued.  2. Hypertension:  Though initially mildly hypertensive on admission, he      was normotensive during the remainder of his admission and was      continued on his home medications on discharge.  3. Left shoulder pain.  This was originally a concern as it could have     potentially been related to his chief complaint, however, x-     ray obtained in the emergency department revealed calcific tendonitis,     degenerative changes and physical exam supported this.  He     had no neurologic deficits on exam.  He responded well to IV     Toradol.  Though he does have a history of peptic ulcer disease in     the past and therefore nonsteroidal anti-inflammatory medicines     have been avoided, it appears that he may benefit from     these, at least in a limited course.  He is followed in the past with     [facility] and a follow up appointment was made for him     approximately 4 days after discharge, so they can further evaluate     him as he may respond well to steroid injection in that joint.  He     was sent home with a limited supply of meloxicam to be taken along     with a proton pump inhibitor. 4.  Hypokalemia.  On the morning of discharge, William [name] had a       potassium of 3.2 and a magnesium of 1.9.  These were both repleted      prior to his discharge.  At follow up in the outpatient, he will      require a repeat BMET.  He was sent home with his regular home potassium      supplementation.  DISCHARGE VITALS:  Temperature 97.3, blood pressure 116/76, pulse 63, respiratory rate 19, O2 Sat 97% on room air.  DISCHARGE LABS:  Sodium 138, potassium 3.2, chloride 102, bicarb 30, BUN 7, creatinine 0.97, glucose 96.  Cardiac enzymes were negative x 3.  Hemoglobin A1c 6.1%.  Cholesterol 159, triglycerides 105, HDL 32,  LDL 106.   ______________________________ Danelle Berry, MD   ______________________________ Doneen Poisson, MD   JW/MEDQ  D:  02/13/2010  T:  02/14/2010  Job:  [id number]  cc:   Lyn Hollingshead, MD  Electronically Signed by Danelle Berry MD on 02/27/2010 12:19:54 PM Electronically Signed by Doneen Poisson  on 03/02/2010 06:18:03 PM

## 2010-03-06 ENCOUNTER — Ambulatory Visit (INDEPENDENT_AMBULATORY_CARE_PROVIDER_SITE_OTHER): Payer: MEDICARE | Admitting: Internal Medicine

## 2010-03-06 ENCOUNTER — Encounter: Payer: Self-pay | Admitting: Internal Medicine

## 2010-03-06 VITALS — BP 127/80 | HR 66 | Temp 97.6°F | Ht 70.5 in | Wt 262.4 lb

## 2010-03-06 DIAGNOSIS — M25519 Pain in unspecified shoulder: Secondary | ICD-10-CM

## 2010-03-06 DIAGNOSIS — IMO0002 Reserved for concepts with insufficient information to code with codable children: Secondary | ICD-10-CM

## 2010-03-06 DIAGNOSIS — L723 Sebaceous cyst: Secondary | ICD-10-CM

## 2010-03-06 DIAGNOSIS — M25512 Pain in left shoulder: Secondary | ICD-10-CM

## 2010-03-06 DIAGNOSIS — F528 Other sexual dysfunction not due to a substance or known physiological condition: Secondary | ICD-10-CM

## 2010-03-06 DIAGNOSIS — M67919 Unspecified disorder of synovium and tendon, unspecified shoulder: Secondary | ICD-10-CM

## 2010-03-06 LAB — BASIC METABOLIC PANEL
BUN: 21 mg/dL (ref 6–23)
CO2: 26 mEq/L (ref 19–32)
Chloride: 101 mEq/L (ref 96–112)
Potassium: 4.1 mEq/L (ref 3.5–5.3)

## 2010-03-06 MED ORDER — HYDROCODONE-ACETAMINOPHEN 5-500 MG PO TABS: 1.0000 | ORAL_TABLET | Freq: Every evening | ORAL | Status: DC | PRN

## 2010-03-06 MED ORDER — IBUPROFEN 600 MG PO TABS: 600.0000 mg | ORAL_TABLET | Freq: Four times a day (QID) | ORAL | Status: AC | PRN

## 2010-03-06 NOTE — Progress Notes (Signed)
Pt aware of appt [facility] 03/12/10 4PM. Stanton Kidney Marielle Mantione RN

## 2010-03-06 NOTE — Patient Instructions (Addendum)
Please make an follow up appointment 1 month from now. Also make to the appointment at sports medicine clinic and also to general Surgeon. Also will give you vicodin 10 tabs, to take at night to help the pain.

## 2010-03-07 ENCOUNTER — Encounter: Payer: Self-pay | Admitting: Internal Medicine

## 2010-03-07 DIAGNOSIS — IMO0002 Reserved for concepts with insufficient information to code with codable children: Secondary | ICD-10-CM | POA: Insufficient documentation

## 2010-03-07 NOTE — Assessment & Plan Note (Signed)
Mr. Ingalls requested to have a prescription for Viagra for his erectile dysfunction, but explained him the contraindication of taking it with Imdur and so advised him to discuss this with his Cardiologist.

## 2010-03-07 NOTE — Assessment & Plan Note (Signed)
Pt still has the same pain in his shoulder. Considering his recent visit at [facility], set an appointment next week for him and prescribed 10 vicodin 5-325 tabs to bridge the pain till pain. Also he was not benefited with Meloxicam and so changed it to Iuprofen.

## 2010-03-07 NOTE — Progress Notes (Signed)
  Subjective:    Patient ID: William [name], male    DOB: 1945-09-20, 65 y.o.   MRN: [id number]  HPI William [name] is a 65 yo man with extensive PMH consisting of CAD with CABG, HTN, HLD, DDD, comes to the clinic for a f/u of his recent hospitalization for atypical chest pain and L shoulder pain. He today c/o L shoulder pain which is little better than before hospitalization, but still has deep aching, 8/10 pain, non-radiating and continuous, more at night and with movements. His pain was better wth IV Toradol during hospitalization, but is not well controlled with Meloxicam. He denies any reflux or PUD symptoms for now.  He was already seen at [facility] medicine clinic this month after hospital D/C and had intraarticular steroid injection in his L shoulder, which according to him, didn't help much too. He also c/o a skin cyst on R side of neck, which is painful at times, but has no discharge. He first noticed it about 3 monthsbefore and is growing steadily.    Review of Systems  Constitutional: Negative for fever, chills and appetite change.  HENT: Negative for hearing loss, ear pain, neck pain and neck stiffness.   Eyes: Negative.   Respiratory: Negative.   Cardiovascular: Negative.   Gastrointestinal: Negative.   Genitourinary: Negative.   Musculoskeletal: Positive for arthralgias.  Skin: Negative.   Neurological: Negative.   Psychiatric/Behavioral: Negative.        Objective:   Physical Exam  Constitutional: He is oriented to person, place, and time. He appears well-developed and well-nourished.  HENT:  Head: Atraumatic.  Eyes: Conjunctivae and EOM are normal. Pupils are equal, round, and reactive to light.  Neck: Normal range of motion. Neck supple. No JVD present. No tracheal deviation present.  Cardiovascular: Normal rate and regular rhythm.   Pulmonary/Chest: Effort normal and breath sounds normal. No respiratory distress. He has no wheezes.  Abdominal: Soft. Bowel sounds are  normal. He exhibits no distension. There is no tenderness.  Musculoskeletal:       Limitation of movements of L shoulder due to pain  Neurological: He is alert and oriented to person, place, and time. No cranial nerve deficit.  Skin: Skin is warm and dry. No rash noted. No erythema.       A 0.5 cm skin cyst on R side neck, mobile with skin, without unusual pigmentation or discharge          Assessment & Plan:

## 2010-03-07 NOTE — Assessment & Plan Note (Signed)
His cyst seems to be a sebaceous cyst most likely from the exam and presentation. Advised to wait and watch if it grows further or gives more pain, but he wanted to get it removed and so referred him to General Surgeon for further management.

## 2010-03-12 ENCOUNTER — Encounter: Payer: Self-pay | Admitting: Family Medicine

## 2010-03-12 ENCOUNTER — Ambulatory Visit (INDEPENDENT_AMBULATORY_CARE_PROVIDER_SITE_OTHER): Payer: MEDICARE | Admitting: Family Medicine

## 2010-03-12 DIAGNOSIS — M25519 Pain in unspecified shoulder: Secondary | ICD-10-CM

## 2010-03-12 DIAGNOSIS — M75110 Incomplete rotator cuff tear or rupture of unspecified shoulder, not specified as traumatic: Secondary | ICD-10-CM | POA: Insufficient documentation

## 2010-03-12 DIAGNOSIS — M7530 Calcific tendinitis of unspecified shoulder: Secondary | ICD-10-CM

## 2010-03-12 DIAGNOSIS — M75100 Unspecified rotator cuff tear or rupture of unspecified shoulder, not specified as traumatic: Secondary | ICD-10-CM | POA: Insufficient documentation

## 2010-03-13 ENCOUNTER — Other Ambulatory Visit: Payer: Self-pay | Admitting: *Deleted

## 2010-03-13 MED ORDER — ISOSORBIDE MONONITRATE ER 60 MG PO TB24: 60.0000 mg | ORAL_TABLET | Freq: Every day | ORAL | Status: DC

## 2010-03-18 ENCOUNTER — Other Ambulatory Visit: Payer: Self-pay | Admitting: General Surgery

## 2010-03-18 ENCOUNTER — Ambulatory Visit (HOSPITAL_BASED_OUTPATIENT_CLINIC_OR_DEPARTMENT_OTHER)
Admission: RE | Admit: 2010-03-18 | Discharge: 2010-03-18 | Disposition: A | Discharge status: Home / Self Care | Payer: MEDICARE | Source: Ambulatory Visit | Attending: General Surgery | Admitting: General Surgery

## 2010-03-18 DIAGNOSIS — L723 Sebaceous cyst: Secondary | ICD-10-CM | POA: Insufficient documentation

## 2010-03-19 ENCOUNTER — Other Ambulatory Visit: Payer: Self-pay | Admitting: Internal Medicine

## 2010-03-21 NOTE — Assessment & Plan Note (Signed)
Summary: L SHOULDER PAIN,MC   Vital Signs:  Patient profile:   65 year old male Pulse rate:   72 / minute BP sitting:   129 / 84  (left arm)  Vitals Entered By: Rochele Pages RN (March 12, 2010 3:59 PM) CC: lt shoulder pain   Primary Provider:  Chauncey Reading DO  CC:  lt shoulder pain.  History of Present Illness: 65yo male to office for f/u of R shoulder pain.  Admitted to hospital end of January for atypical CP & noted to have large calcification in infraspinatus tendon.  Seen by Dr. Jennette Kettle in our office 02/15/10 & underwent subacromial steroid injection.  Injection helped for only 1-2 days.  Continues to have pain in L shoulder extending down lateral arm to the elbow.  Increased pain with overhead activities & reaching across his body to put on deodorant or buckle seat belt in car.  (+)Night time pain.  Denies numbness/tingling.  No previous hx of shoulder injuries.  Is s/p ACDF @ C3-5.  Taking Mobic 7.5mg  1-2 tabs daily as prescribed by PCP with minimal improvement & doing shoulder ROM exercises.  Preventive Screening-Counseling & Management  Alcohol-Tobacco     Smoking Status: never  Allergies (verified): No Known Drug Allergies  Past History:  Past Medical History: Last updated: 04/05/2008 Hx of VDRF after cervical spine surgery 10/09 PEPTIC ULCER DISEASE (ICD-533.90) INADEQUATE MATERIAL RESOURCES (ICD-V60.2) SPINAL STENOSIS, CERVICAL (ICD-723.0) ERECTILE DYSFUNCTION (ICD-302.72) DENTAL CARIES (ICD-521.00) GLUCOSE INTOLERANCE (ICD-271.3) NUMBNESS, HAND (ICD-782.0) POLYURIA (ICD-788.42) SHOULDER PAIN, RIGHT (ICD-719.41) ABSCESS (ICD-682.9) SKIN TAG (ICD-701.9) OBESITY (ICD-278.00) FATIGUE (ICD-780.79) HYPERTENSION (ICD-401.9) HYPERLIPIDEMIA (ICD-272.4) THYROIDECTOMY, HX OF (ICD-V45.79) LEG PAIN, CHRONIC (ICD-729.5) DISC DISEASE, LUMBOSACRAL SPINE (ICD-722.52) STENOSIS, LUMBAR SPINE (ICD-724.02) DYSPNEA (ICD-786.05) CLAUDICATION (ICD-443.9) GOITER  (ICD-240.9) RETINAL HEMORRHAGE (ICD-362.81) CATARACT, LEFT EYE (ICD-366.9) SLEEP APNEA, OBSTRUCTIVE (ICD-327.23) ANGINA PECTORIS (ICD-413.9) ARTHRITIS, HANDS, 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  Inguinal herniorrhaphy  Social History: Last updated: 04/05/2008 Disabled (CABG) Never Smoked Alcohol use-no Drug use-no Regular exercise-yes Divorced   Review of Systems       per HPI  Physical Exam  General:  Well-developed,well-nourished,in no acute distress; alert,appropriate and cooperative throughout examination Msk:  C-SPINE: Decreased ROM in all planes without pain.  No midline or paraspinal tenderness.  SHOULDERS:  - L shoulder: mild atrophy of infraspinatus is noted.  Slight decreased active ROM with (+)painful arch - increased pain with cross-body adduction.  Full passive ROM.  TTP along insertion of supraspinatus & infraspinatus on humeral head.  No TTP over bicipital groove.  (+)Empty can.  Neg Hawkins & Neer.  Neg Obriens.  Mild weakness with external rotation & abduction, good IR strength. - R shoulder: FROM without pain, swelling, tenderness, weakness, or instability.  Neurovascularly intact distally Additional Exam:  MSK U/S: L shoulder- normal appearing bicep tendon.  Normal appearing subscapularis.  Supraspinatus with small scattered calcifications, no signs of acute tear, no increased doppler flow.  Infraspinatus with large calcification near insertion on humerus, also noted to have intrasubstance tear of infraspinatus; no increased doppler flow.  No significant dynamic impingement is noted.  Moderate spurring & DJD of the AC-joint.  Images saved.   Impression &  Recommendations:  Problem # 1:  SHOULDER PAIN, LEFT (ICD-719.41) Assessment Unchanged  - Noted to have calcific  tendinitis in infraspinatus with additional intrasubstance RTC of infraspinatus - Minimal improvement with recent subacromial injection   - PROCEDURE NOTE: Consent obtained and verified. Large calcification within infraspinatus localized with assistance of MSK U/S & area marked. Sterile betadine prep. Furthur cleansed with alcohol. Topical analgesic spray: Ethyl chloride. Joint: L shoulder - calcification in infraspinatus tendon Local anesthesia: 3cc 1% lidocaine without epi used to create a wheel, then anesthetize needle tract to calcification Approached in typical fashion with: patient laying on right side with left shoulder adducted across his body Sterile u/s gel used & calcification in infraspinatus tendon again localized.  10cc solution of 9cc normal saline + 1cc Kenalog 10mg /cc used to perform barbotage of calcification.  2cc of solution injected & aspirated at a time.  Total of 5cc of solution injected into area.  Needle placement confirmed with U/S & images saved. Completed without difficulty Meds: 3cc 1% lidocaine w/o epi for local; 10cc solution of 9cc normal saline + 1cc Kenalog 10mg /cc Needle: 21g 1.5 inch Tolerated procedure well.  Minimal amount of calcium aspirated.  U/S images saved. Aftercare instructions and Red flags advised. - Cont. light shoulder ROM exercises - wall crawls, shoulder circles - cont. Mobic - f/u 2-weeks for re-evaluation   His updated medication list for this problem includes:    Aspirin 81 Mg Tbec (Aspirin) .Marland Kitchen... Take 1 tablet by mouth once a day    Vicodin 5-500 Mg Tabs (Hydrocodone-acetaminophen) .Marland Kitchen... 1-2 by mouth at bedtime as needed wrist pain  Orders: [place] LIMITED (16109) US GUIDED INJECTION (60454) Joint Aspirate / Injection, Large (20610) Kenalog 10 mg inj (J3301)  Problem # 2:  TENDINITIS, CALCIFIC, SHOULDER, LEFT  (ICD-726.11)  - Calcific tendinitis of infraspinatus as noted on U/S - U/S guided barbatage completed as stated above  Orders: [place] LIMITED (09811) US GUIDED INJECTION (91478) Joint Aspirate / Injection, Large (20610) Kenalog 10 mg inj (J3301)  Problem # 3:  PARTIAL TEAR OF ROTATOR CUFF (ICD-726.13) Assessment: New  - Partial instrasubstance tear of infraspinatus as noted on U/S - Cont. shoulder ROM exercises - Cont. Mobic - Procedure today may help with pain - Could consider possible NTG tx in the future, although pt already on oral nitrates.  Would need low-dose patch & close monitoring.  Will assess on f/u in 2-weeks.  Orders: [place] LIMITED [phone]) US GUIDED INJECTION (13086) Joint Aspirate / Injection, Large (20610) Kenalog 10 mg inj (J3301)  Complete Medication List: 1)  Aspirin 81 Mg Tbec (Aspirin) .... Take 1 tablet by mouth once a day 2)  Carvedilol 12.5 Mg Tabs (Carvedilol) .... Take 1 tablet by mouth two times a day 3)  Imdur 60 Mg Tb24 (Isosorbide mononitrate) .... Take 1 tablet by mouth once a day 4)  Klor-con 10 10 Meq Tbcr (Potassium chloride) .... Take 1 tablet by mouth once a day 5)  Lisinopril 40 Mg Tabs (Lisinopril) .... Take 1 tablet by mouth once a day 6)  Hydrochlorothiazide 25 Mg Tabs (Hydrochlorothiazide) .... Take 1 tablet by mouth once a day 7)  Pravastatin Sodium 80 Mg Tabs (Pravastatin sodium) .... Take 1 tablet by mouth at bedtime 8)  Nitroquick 0.4 Mg Subl (Nitroglycerin) .... Take one pill under your tongue every 5 minutes up to three times for chest pain 9)  Niacin 500 Mg Tabs (Niacin) .... Take 1 tablet by mouth two times a day.  take an 81mg  aspirin 30 minutes before taking this medication to prevent a possible skin flushing reaction. 10)  Loratadine 10 Mg  Tabs (Loratadine) .... Take 1 tablet by mouth once a day for allergies and post nasal drip 11)  Prilosec 20 Mg Cpdr (Omeprazole) .... Take 1 tablet by mouth daily 12)  Vicodin 5-500 Mg Tabs  (Hydrocodone-acetaminophen) .Marland Kitchen.. 1-2 by mouth at bedtime as needed wrist pain   Orders Added: 1)  Est. Patient Level IV [16109] 2)  [place] LIMITED [76882] 3)  US GUIDED INJECTION [60454] 4)  Joint Aspirate / Injection, Large [20610] 5)  Kenalog 10 mg inj [J3301]

## 2010-03-21 NOTE — Op Note (Addendum)
  NAME:  William [name], William [name] NO.:  [id number]  MEDICAL RECORD NO.:  [id number]           PATIENT TYPE:  I  LOCATION:  4714                         FACILITY:  [facility]  PHYSICIAN:  Juanetta Gosling, MDDATE OF BIRTH:  04-12-1945  DATE OF PROCEDURE: DATE OF DISCHARGE:  02/13/2010                              OPERATIVE REPORT   PREOPERATIVE DIAGNOSIS:  Right neck cyst.  POSTOPERATIVE DIAGNOSIS:  Right neck cyst.  PROCEDURE:  Right neck cyst excisional biopsy 1 x 1 cm subcutaneous mass.  SURGEON:  Juanetta Gosling, MD  ASSISTANT:  None.  ANESTHESIA:  Local anesthesia.  This was done in the minor room at [facility].  COMPLICATIONS:  None.  DRAINS:  None.  ESTIMATED BLOOD LOSS:  Minimal.  DISPOSITION:  Home in stable condition.  INDICATIONS:  This is a 65 year old male with about a year and a half history of a mass on his right neck that has bothered him shaving and is caused him some discomfort on his exam.  He had just a subcutaneous 1 x 1 cm lesion consistent within ICD and I discussed excising this in the minor room at [facility].  PROCEDURE:  After informed consent was obtained, the patient was taken to the operating room.  He was prepped with Betadine.  He was then draped in a standard sterile surgical fashion.  Surgical time-out was then performed.  I infiltrated with 0.25% Marcaine with epinephrine.  I then made an elliptical incision around this area and removed the area in total. Hemostasis was then obtained.  I then closed this with a 3-0 Vicryl dermal sutures and then 4-0 Monocryl in a subcuticular fashion.  I then placed Dermabond over this.  He tolerated this well and was discharged home in stable condition.     Juanetta Gosling, MD     MCW/MEDQ  D:  03/18/2010  T:  03/19/2010  Job:  [id number]  Electronically Signed by Emelia Loron MD on 03/19/2010 07:43:20 PM

## 2010-04-04 ENCOUNTER — Encounter: Payer: Self-pay | Admitting: Internal Medicine

## 2010-04-04 ENCOUNTER — Ambulatory Visit (INDEPENDENT_AMBULATORY_CARE_PROVIDER_SITE_OTHER): Payer: MEDICARE | Admitting: Internal Medicine

## 2010-04-04 VITALS — BP 116/72 | HR 72 | Temp 98.5°F | Ht 71.0 in | Wt 257.7 lb

## 2010-04-04 DIAGNOSIS — M67919 Unspecified disorder of synovium and tendon, unspecified shoulder: Secondary | ICD-10-CM

## 2010-04-04 DIAGNOSIS — I25810 Atherosclerosis of coronary artery bypass graft(s) without angina pectoris: Secondary | ICD-10-CM

## 2010-04-04 DIAGNOSIS — I10 Essential (primary) hypertension: Secondary | ICD-10-CM

## 2010-04-04 DIAGNOSIS — F528 Other sexual dysfunction not due to a substance or known physiological condition: Secondary | ICD-10-CM

## 2010-04-04 DIAGNOSIS — M719 Bursopathy, unspecified: Secondary | ICD-10-CM

## 2010-04-04 NOTE — Assessment & Plan Note (Addendum)
Since he has been taking imdur, viagra or other similar PDE5 inhibitors are not right for him. He can not afford MUSE, but He has pump and used twice a month. Advised him to use pump more often. He understands these and would like to try pump.

## 2010-04-04 NOTE — Assessment & Plan Note (Signed)
His should pain resolved after steroid injection and not take other pain medications.

## 2010-04-04 NOTE — Assessment & Plan Note (Signed)
Lab Results  Component Value Date   NA 138 03/06/2010   K 4.1 03/06/2010   CL 101 03/06/2010   CO2 26 03/06/2010   BUN 21 03/06/2010   CREATININE 1.14 03/06/2010   CREATININE 0.97 02/13/2010    BP Readings from Last 3 Encounters:  04/04/10 116/72  03/12/10 129/84  03/06/10 127/80    BP at goal and will continue current medications.

## 2010-04-04 NOTE — Progress Notes (Signed)
Subjective:    Patient ID: William [name], male    DOB: Jul 06, 1945, 65 y.o.   MRN: [id number]  HPI Patient is a 65 years old malewith past medical history  as outlined here who comes to the Clinic for the f/u for his left shoulder pain. Now his shoulder pain resolved after steroid injection about a month ago and it started to work about 10 days, nowhe has no pain. Has no other c/o, including fever, chill, chest pain, shortness of breath, hemoptysis, abdominal pain, nausea, vomiting, diarrhea, melena, dysuria, significant weight change. Denies recent smoking, alcohol or drug abuse. Has been taking all his medications as instructed. He also wants to get viagra for his ED.     Review of Systems Per HPI.  Current Outpatient Medications Current Outpatient Prescriptions  Medication Sig Dispense Refill  . aspirin 81 MG EC tablet Take 81 mg by mouth daily.        . carvedilol (COREG) 12.5 MG tablet Take 12.5 mg by mouth 2 (two) times daily.        . hydrochlorothiazide 25 MG tablet TAKE ONE TABLET BY MOUTH ONE TIME DAILY  90 tablet  0  . isosorbide mononitrate (IMDUR) 60 MG 24 hr tablet Take 1 tablet (60 mg total) by mouth daily.  30 tablet  6  . lisinopril (PRINIVIL,ZESTRIL) 40 MG tablet Take 40 mg by mouth daily.        Marland Kitchen loratadine (CLARITIN) 10 MG tablet Take 10 mg by mouth daily as needed. For allergies and post nasal drip       . niacin 500 MG tablet Take 500 mg by mouth 2 (two) times daily. Take an 81 mg Aspirin 30 minutes before taking this medication to prevent a possible skin flushing reaction       . nitroGLYCERIN (NITROSTAT) 0.4 MG SL tablet Place 0.4 mg under the tongue every 5 (five) minutes. Up to 3 times as needed for chest pain       . omeprazole (PRILOSEC) 20 MG capsule Take 20 mg by mouth daily.        . potassium chloride (KLOR-CON) 10 MEQ CR tablet Take 10 mEq by mouth daily.        . pravastatin (PRAVACHOL) 80 MG tablet TAKE  ONE TABLET BY MOUTH NIGHTLY AT BEDTIME  30 tablet  0    . timolol (TIMOPTIC) 0.5 % ophthalmic solution       . HYDROcodone-acetaminophen (VICODIN) 5-500 MG per tablet Take 1-2 tablets by mouth at bedtime as needed for Pain. For Shoulder pain  10 tablet  0  . ibuprofen (MOTRIN) 600 MG tablet Take 1 tablet (600 mg total) by mouth every 6 (six) hours as needed for Pain.  60 tablet  2  . DISCONTD: pantoprazole (PROTONIX) 40 MG tablet         Allergies Review of patient's allergies indicates no known allergies.  Past Medical History  Diagnosis Date  . Rotator cuff syndrome   . ED (erectile dysfunction)   . Hyperlipidemia   . Hypertension   . CAD (coronary artery disease)   . Goiter   . Shoulder pain, left   . GERD (gastroesophageal reflux disease)   . Sleep apnea, obstructive   . Arthritis     Past Surgical History  Procedure Date  . Coronary artery bypass graft 1999  . Eye surgery 08/09    Cataract Removal  . Hernia repair     Inguinal Herniorhaphy  . Spine surgery 10/09  C3-4, C4-5 diskectomy       Objective:   Physical Exam General: Vital signs reviewed and noted. Well-developed,well-nourished,in no acute distress; alert,appropriate and cooperative throughout examination. Head: normocephalic, atraumatic. Neck: No deformities, masses, or tenderness noted. Lungs: Normal respiratory effort. Clear to auscultation BL without crackles or wheezes.  Heart: RRR. S1 and S2 normal without gallop, murmur, or rubs.  Abdomen: BS normoactive. Soft, Nondistended, non-tender.  No masses or organomegaly. Extremities: No pretibial edema.         Assessment & Plan:

## 2010-04-04 NOTE — Assessment & Plan Note (Addendum)
No chest or palpitation and has been f/u by his Cardiologist Dr. Daleen Squibb. No change his medications.

## 2010-04-04 NOTE — Patient Instructions (Signed)
Please take all your medications as instructed in your instructions.   Please call the Clinic for appointment or go to Emergency Department if your symptoms do not improve or get worse.   

## 2010-04-18 ENCOUNTER — Other Ambulatory Visit: Payer: Self-pay | Admitting: *Deleted

## 2010-04-18 DIAGNOSIS — E782 Mixed hyperlipidemia: Secondary | ICD-10-CM

## 2010-04-19 MED ORDER — LISINOPRIL 40 MG PO TABS: 40.0000 mg | ORAL_TABLET | Freq: Every day | ORAL | Status: DC

## 2010-05-07 ENCOUNTER — Other Ambulatory Visit: Payer: Self-pay | Admitting: *Deleted

## 2010-05-07 MED ORDER — CARVEDILOL 12.5 MG PO TABS: 12.5000 mg | ORAL_TABLET | Freq: Two times a day (BID) | ORAL | Status: DC

## 2010-05-13 ENCOUNTER — Other Ambulatory Visit: Payer: Self-pay | Admitting: Internal Medicine

## 2010-05-13 DIAGNOSIS — E785 Hyperlipidemia, unspecified: Secondary | ICD-10-CM

## 2010-05-20 ENCOUNTER — Ambulatory Visit (INDEPENDENT_AMBULATORY_CARE_PROVIDER_SITE_OTHER): Payer: MEDICARE | Admitting: Internal Medicine

## 2010-05-20 ENCOUNTER — Encounter: Payer: Self-pay | Admitting: Internal Medicine

## 2010-05-20 VITALS — BP 132/84 | HR 73 | Temp 98.3°F | Ht 70.0 in | Wt 256.4 lb

## 2010-05-20 DIAGNOSIS — E785 Hyperlipidemia, unspecified: Secondary | ICD-10-CM

## 2010-05-20 DIAGNOSIS — I10 Essential (primary) hypertension: Secondary | ICD-10-CM

## 2010-05-20 MED ORDER — CARVEDILOL 25 MG PO TABS: 25.0000 mg | ORAL_TABLET | Freq: Two times a day (BID) | ORAL | Status: DC

## 2010-05-20 NOTE — Assessment & Plan Note (Signed)
Last LDL 106. We'll continue Pravachol without any change now.

## 2010-05-20 NOTE — Progress Notes (Signed)
  Subjective:    Patient ID: William [name], male    DOB: September 21, 1945, 65 y.o.   MRN: [id number]  HPI [name] is a pleasant 65 year old man with extensive past history of CAD status post CABG in 42, hypertension, lipidemia who comes to the clinic for a followup visit.  He complains of  mild short of breath after walking about 3 blocks. Never had any short of breath at rest or did not work up at night feeling short of breath and coughing. He says his shortness of breath is not bothering him much. Denies any chest pain, chest tightness. He does complain of heaviness in his legs for last 3-4 months but that off happens after he does exercise and doesn't bother him much and gets relief with rest. Denies any fever, chills, night sweats, recent weight loss unintentional, diarrhea, abdominal pain, urinary abnormalities. Of note he wants to stop as many medications as possible and he also as this during last visit.  Review of Systems    as per history of present illness Objective:   Physical Exam    Constitutional: Vital signs reviewed.  Patient is a well-developed and well-nourished  in no acute distress and cooperative with exam. Alert and oriented x3.  Head: Normocephalic and atraumatic Mouth: no erythema or exudates, MMM Eyes: PERRL, EOMI, conjunctivae normal, No scleral icterus.  Neck: Supple, Trachea midline normal ROM, No JVd. Cardiovascular: RRR, S1 normal, S2 normal, no MRG Pulmonary/Chest: CTAB, no wheezes, rales, or rhonchi Abdominal: Soft. Non-tender, non-distended, bowel sounds are normal, no masses, organomegaly, or guarding present.  GU: no CVA tenderness Musculoskeletal: No joint deformities, erythema, or stiffness. Neurological: A&O x3, Strenght is normal and symmetric bilaterally, cranial nerve II-XII are grossly intact, no focal motor deficit, sensory intact to light touch bilaterally.  Skin: Warm, dry and intact. No rash, cyanosis, or clubbing.  Psychiatric: Normal mood and  affect. speech and behavior is normal. Judgment and thought content normal. Cognition and memory are normal.       Assessment & Plan:

## 2010-05-20 NOTE — Assessment & Plan Note (Addendum)
His blood pressure is well-controlled. But is not beta blocked as his heart rate is in 70s. So will increase the dose of Coreg to 25 mg twice a day. He wants to discontinue as many meds as possible from this med list. Talking with him and then talking with Dr. Josem Kaufmann and looking at his history, we'll stop hydrochlorothiazide and KCL now and see how he does 2 weeks followup. We'll check a BMP today and also to the next visit. Today is for checking the potassium to make sure it's not too low. He is understanding of plan.

## 2010-05-20 NOTE — Patient Instructions (Signed)
Please make a followup appointment in 2-3 weeks with Dr. Allena Katz. Stop taking hydrochlorothiazide and also potassium chloride tablets from now, as we start cutting back on her medications starting from this. Also we are increasing the dose of Coreg to 25 mg twice daily from 12.5 mg twice daily before. We will check labs today and will come back the next visit.  If you have any problems between today and the next visit call the clinic.

## 2010-05-21 LAB — BASIC METABOLIC PANEL
BUN: 11 mg/dL (ref 6–23)
Chloride: 104 mEq/L (ref 96–112)
Creat: 0.81 mg/dL (ref 0.40–1.50)
Glucose, Bld: 89 mg/dL (ref 70–99)
Potassium: 3.7 mEq/L (ref 3.5–5.3)

## 2010-05-28 NOTE — Assessment & Plan Note (Signed)
[facility]                            CARDIOLOGY OFFICE NOTE   [name], [name]                          MRN:          [id number]  DATE:09/22/2007                            DOB:          Sep 19, 1945    [name] comes today to establish with me as his cardiologist.  He has been a former patient of Dr. Simona Huh.   He was last seen by Dr. Diona Browner on March 02, 2007.  He had been  recently admitted to [facility] in January with chest pain.  CT scan  was negative for pulmonary embolus and a stress Myoview showed mild  inferolateral wall scar without ischemia.  His EF was 55%.  He has had  previous multivessel coronary disease, status post coronary bypass  grafting in 1999.  His last catheterization was in March 2008, at which  time he had patent grafts.   He was at the [facility] this past weekend and had some chest tightness  and some sharp chest pains.  He did not have to take nitroglycerin.  He  thinks he overdid it.   He did not bring a medication list today.   He has no known drug allergies.   He is currently on,  1. Aspirin 81 mg a day.  2. Carvedilol 12.5 mg once a day (when questioned, he thinks he is      taking it twice day).  3. Imdur 60 mg a day.  4. Potassium 10 mEq daily.  5. Lisinopril 40 mg a day.  6. HCTZ 25 mg a day.  7. Pravastatin 80 mg q.h.s.   PHYSICAL EXAMINATION:  VITAL SIGNS:  Today, his blood pressure 143/81,  his pulse is 77.  Weight is down 8 to 281.  GENERAL:  He is very pleasant.  HEENT:  Normal.  NECK:  Carotid upstrokes are equal bilaterally without bruits.  No JVD.  Thyroid is not enlarged.  Trachea is midline.  LUNGS:  Clear to auscultation and percussion.  HEART:  Regular rate and rhythm.  No rub, murmur, or gallop.  ABDOMINAL:  Protuberant, good bowel sounds.  No midline bruit.  EXTREMITIES:  No cyanosis or clubbing, but there was trace to 1+ pitting  edema.  Pulses are intact.  NEURO:   Intact.  No sign of DVT.  SKIN:  Unremarkable.   Electrocardiogram today shows normal sinus rhythm with T-wave inversion  in V1 through V3, which is unchanged from before.   ASSESSMENT AND PLAN:  Mr. Applegate has had some chest discomfort, but with  a recent negative ischemic evaluation just months ago, I have given him  reassurance.  He may have just simply overdone it and had some  musculoskeletal pain.   I have asked him to update his nitroglycerin and to take it as  instructed.  If he begins to have more chest discomfort or problems, he  will let us know.  Otherwise, we will see him back again in 6 months.     Thomas C. Daleen Squibb, MD, [facility]  Electronically Signed    TCW/MedQ  DD: 09/22/2007  DT: 09/23/2007  Job #: [id number]   cc:   Chauncey Reading, D.O.

## 2010-05-28 NOTE — Discharge Summary (Signed)
NAME:  William [name], William [name] NO.:  [id number]   MEDICAL RECORD NO.:  [id number]          PATIENT TYPE:  INP   LOCATION:  3732                         FACILITY:  [facility]   PHYSICIAN:  Duke Salvia, MD, FACCDATE OF BIRTH:  09-25-1945   DATE OF ADMISSION:  02/13/2007  DATE OF DISCHARGE:  02/14/2007                               DISCHARGE SUMMARY   PRIMARY CARDIOLOGIST:  Jonelle Sidle, M.D.   DISCHARGE DIAGNOSIS:  Chest pain.   SECONDARY DIAGNOSES:  1. Coronary artery disease, status post coronary artery bypass graft.  2. Hypertension.  3. Obesity.  4. History of dizziness and cough.  5. Chronic back pain.  6. Obstructive sleep apnea on CPAP.  7. History of thyroid mass, status post excision 2006.  8. Left inguinal hernia repair.  9. Depression.  10.Anxiety.  11.History of noncompliance.   HISTORY OF PRESENT ILLNESS:  A 65 year old African American male with  the above problem list.  He presented to the [facility] ED on February 13, 2007 secondary to prolonged period of chest discomfort and pressure  radiating through to his back as well as neck and bilateral upper and  lower extremities.  The discomfort started at 11 p.m. the evening before  and was not relieved with nitroglycerin.  The discomfort was pleuritic  and tender to the touch.  He was admitted for further evaluation.   HOSPITAL COURSE:  The patient ruled out for MI by cardiac markers.  He  has had no additional chest discomfort and was planned to discharge him  today and we will arrange for an outpatient Myoview this week and follow  up with Dr. Diona Browner.   DISCHARGE LABORATORY:  Hemoglobin 13.4, hematocrit 39.4, WBC 3.9,  platelets 143.  INR 1.1.  Sodium 136, potassium 3.8, chloride 103,  glucose 123, BUN 17, creatinine 1.3.  CK 159, MB 1.3, troponin I 0.02.   DISPOSITION:  The patient is being discharged home today in good  condition.   FOLLOWUP PLANS/APPOINTMENTS:  1. As above we will  arrange for followup Myoview this week.  2. As well as follow up with Dr. Diona Browner in approximately 2 weeks.   DISCHARGE MEDICATIONS:  1. Aspirin 81 mg daily.  2. Coreg 12.5 mg b.i.d.  3. Imdur 60 mg daily.  4. Klor-Con 10 mEq daily.  5. Lisinopril 40 mg daily.  6. Hydrochlorothiazide 25 mg daily.  7. Pravastatin 80 mg q.h.s.  8. Pepcid 20 mg b.i.d.  9. Nitroglycerin 0.4 mg sublingual p.r.n. chest pain.   OUTSTANDING LABORATORY STUDIES:  None.   DURATION DISCHARGE ENCOUNTER:  Forty minutes with physician time.      Nicolasa Ducking, ANP      Duke Salvia, MD, [facility]  Electronically Signed    CB/MEDQ  D:  02/14/2007  T:  02/14/2007  Job:  [id number]

## 2010-05-28 NOTE — Discharge Summary (Signed)
NAME:  AVREY, HYSER NO.:  [id number]   MEDICAL RECORD NO.:  [id number]          PATIENT TYPE:  OIB   LOCATION:  3007                         FACILITY:  [facility]   PHYSICIAN:  Reinaldo Meeker, M.D. 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   PRIMARY OPERATIVE PROCEDURE:  C3-4, C4-5 anterior cervical diskectomy  with fusion and plating.   HISTORY:  Mr. Jowers is a 65 year old gentleman with signs of cervical  myelopathy.  MRI scan showed marked stenosis at C3-4 and C4-5.  He is  now admitted for two-level anterior cervical discectomy.  He tolerated  the procedure well though he developed respiratory issues postop and  needed to be reintubated.  Critical Care Medicine was consulted and they  helped with his ventilator management.  Because of the patient's large  size, neck surgery and history of sleep apnea, he was bit of a difficult  to intubation and it took approximately 3-4 days for him to be  extubated.  Once that was done, he was slowly able to increase his  activities and advance his diet without difficulty.  By October 23, 2007, he had been transferred to the floor.  He denied any respiratory  distress.  He has been ambulating well.  It was felt that he will be  discharged to home.  Discharge medications include pain medicine.  His  condition was markedly improved versus admission.           ______________________________  Reinaldo Meeker, M.D.     ROK/MEDQ  D:  12/02/2007  T:  12/02/2007  Job:  [id number]

## 2010-05-28 NOTE — H&P (Signed)
NAME:  MCCLELLAN, DEMARAIS NO.:  [id number]   MEDICAL RECORD NO.:  [id number]          PATIENT TYPE:  INP   LOCATION:  3732                         FACILITY:  [facility]   PHYSICIAN:  Duke Salvia, MD, FACCDATE OF BIRTH:  [birth year]   DATE OF ADMISSION:  02/13/2007  DATE OF DISCHARGE:                              HISTORY & PHYSICAL   REASON FOR VISIT:  We are asked to see William [name] the emergency room because of chest  pain.   HISTORY OF PRESENT ILLNESS:  Mr. Dresser is a 65 year old African  American gentleman who presents to the emergency room with chest pain  that began late last night, early this morning.  It is described as left  side with radiation to his left arm.  It is accompanied by tingling in  his hands and his legs over the last couple of days.  He has also had a  cough over the last couple of days and describes his stool pattern,  changing to becoming black with being quite malodorous.   His chest pain today is described as radiating is noted.  It is  accompanied by mild shortness of breath.  It has been intermittently  attenuated by morphine and nitroglycerin only to recur.  There is some  pleuritic component to it.  It seems to be worse if he moves.  It is  quite distinct from his myocardial infarction discomfort which he  describes as a heavy pressure.   He was last catheterized in March 2008 when he presented with the chest  pain syndrome quite similar to this.  At that time, his native vessels  were all occluded and his grafts were all patent with a RIMA to his RCA,  a LIMA to his LAD/diagonal and a vein graft to his ramus.   At this juncture, his point of care enzymes are negative.  His  electrocardiogram is unchanged from November 2008.   CURRENT MEDICATIONS:  1. Aspirin 81.  2. Carvedilol 12.5.  3. Imdur 60.  4. Potassium 10.  5. Lisinopril 40.  6. Hydrochlorothiazide 25.  7. Pravastatin 80.  8. Pepcid 20 b.i.d.  9. P.r.n.  nitroglycerin.   ALLERGIES:  He has no known drug allergies.   SOCIAL HISTORY:  He lives in [place].  He is divorced.  He has one  son and two grandchildren.  He is disabled from [facility].   PAST SURGICAL HISTORY:  1. Thyroid mass resection.  2. Inguinal herniorrhaphy.   FAMILY HISTORY:  Noncontributory.   REVIEW OF SYSTEMS:  As Noted previously.  He also wears glasses.  His  teeth are in bad shape.  He is significantly obese.   PHYSICAL EXAMINATION:  GENERAL:  On examination he is a healthy-  appearing African American male in some in some apparent distress.  VITAL SIGNS:  His blood pressure was 108/79 with a pulse of 65 lying,  and when he sat up it went up to 97.  HEENT:  Exam demonstrated no icterus or xanthomata.  NECK:  Veins were flat.  Carotids were brisk.  BACK:  Without kyphosis or scoliosis.  LUNGS:  Clear.  HEART:  Sounds were regular without murmurs or gallops.  There was  tenderness in his sternal area.  Pain suggested by movement of his arm,  but with passive movement of his arm.  There was no significant  discomfort.  He had some epigastric tenderness and bowel sounds were  active.  EXTREMITIES:  Femoral pulses were 2+.  Distal pulses were intact.  There  was no clubbing, cyanosis or edema.  RECTAL:  Stool exam was guaiac negative.  NEUROLOGICAL:  Exam was grossly normal.  The skin was warm and dry.   STUDIES:  Electrocardiogram dated today demonstrated sinus rhythm at 86  with intervals of 0.18/0.09/0.35 with an axis of 30 degrees.  There were  T-wave inversions in the anterior precordium and compared to  electrocardiogram from November 2008 there are no significant changes.   LABORATORY DATA:  Laboratory this morning thus far notable for point  care enzymes being normal his white count is 8.3 with 80% polys.  His  blood gas shows a pCO2 of 50.7 with a pH of 3.4.  Chemistries were  normal.   IMPRESSION:  1. Chest pain with typical and  atypical features.  2. Point of care enzymes and ECG without change.  3. Known coronary artery disease.      a.     Status post CABG.      b.     Status post catheterization March 2008 for similar       presentations today with total native circulation and patent       grafts as noted above with normal left ventricular function.  4. Recent change in stool described as black and malodorous, although      stool is guaiac negative.  5. Hypertension.  6. Obesity.  7. Recent dizziness and cough again quite similar to the description      he had when he presented March 2008.   Mr. Fell is complained of chest pain that is quite significant, and he  is in moderate distress.  Thus far, with his persistent pain for 8-10  hours and negative point care markers and nonischemic electrocardiogram,  I am not in a hurry to take him to the catheterization laboratory.  Pulmonary embolism is something that should be excluded, and with his  history of cough, notwithstanding the absence of fever and perhaps with  his modest neutropenia and left shift, pneumonia is something to be  considered.  We will plan to undertake a CT scan to evaluate the above,  continue to follow his enzymes.   PLAN:  1. CT scanning.  2. Heparin and nitroglycerin.  3. Serial enzymes.  4. Consider empiric antibiotics.      Duke Salvia, MD, [facility]  Electronically Signed     SCK/MEDQ  D:  02/13/2007  T:  02/14/2007  Job:  [id number]   cc:   Chauncey Reading, D.O.

## 2010-05-28 NOTE — Op Note (Signed)
NAME:  William [name], William [name] NO.:  [id number]   MEDICAL RECORD NO.:  [id number]          PATIENT TYPE:  OIB   LOCATION:  3107                         FACILITY:  [facility]   PHYSICIAN:  Reinaldo Meeker, M.D. DATE OF BIRTH:  1945/11/07   DATE OF PROCEDURE:  10/14/2007  DATE OF DISCHARGE:                               OPERATIVE REPORT   PREOPERATIVE DIAGNOSIS:  Spinal stenosis with cervical myelopathy at C3-  4 and C4-5.   POSTOPERATIVE DIAGNOSIS:  Spinal stenosis with cervical myelopathy at C3-  4 and C4-5.   PROCEDURE:  C3-4 and C4-5 anterior cervical diskectomy with bone bank  fusion followed by Helix anterior cervical plating.   SURGEON:  Reinaldo Meeker, MD   ASSISTANT:  Tia Alert, MD   PROCEDURE IN DETAIL:  After being placed in a supine position and 10  pounds of halter traction, the patient's neck was prepped and draped in  usual sterile fashion.  Localizing fluoroscopy was used prior to  incision to identify the appropriate level and a transverse incision was  made in the right anterior neck starting at the midline and heading  towards the medial aspect of the sternocleidomastoid muscle.  The  platysma muscle was then incised transversely.  The natural fascial  plane between the strap muscles medially and the sternocleidomastoid  laterally was identified and followed down to the anterior aspect of the  cervical spine.  Longus colli muscles were identified, split in the  midline, stripped away bilaterally with Special educational needs teacher.  Unipolar coagulation was also used.  Self-retaining  retractor was placed for exposure and x-rays showed approach to the  appropriate level.  Using a 15 blade, the disk at C3-4 and C4-5 was  incised.  Using pituitary rongeurs and curettes, thorough disk space  clean-out was carried out.  High-speed drill was used to widen the  interspace at both levels.  At this time, the microscope was draped,  brought into  field and used throughout the case.  Starting at C3-4, the  remainder of the disk material down the posterior longitudinal ligament  was removed.  The ligament was then incised transversely and the cut  edge removed with a Kerrison punch.  Herniated disk material and bony  overgrowth were removed to decompress the spinal dura as well as the  proximal foramen bilaterally.  At this time, inspection was carried out  at this level for any evidence of residual compression and none could be  identified.  Attention was then turned to C4-5 where a similar  decompression was carried out.  Once again, high-speed drill was used to  widen the interspace and the remainder of the disk material down the  posterior longitudinal ligament was removed.  Once again, thorough  spinal decompression was carried out with removal of bony overgrowth and  herniated disk material and hypertrophic ligament until the spinal dura  and foraminal nerve roots were decompressed bilaterally.  At this time,  inspection was carried out at both levels bilaterally for any evidence  of residual compression and none could be  identified.  Large amounts of  irrigation were carried out.  Any bleeding controlled with bipolar  coagulation and Gelfoam.  Measurements were taken, two 6 mm bone bank  plugs reconstituted.  After irrigating once more, the plugs impacted  without difficulty and fluoroscopy showed them to be in good position.  At this time, appropriate length Helix anterior cervical plate was then  chosen.  Under fluoroscopic guidance, drill holes were placed followed  by placing of 13 mm screws x6 bilaterally at all three levels.  Locking  mechanism was seen to engage bilaterally at all three levels.  Final  fluoroscopy, however, showed the plate, screws and plugs to be in good  position.  At this time, large amounts of irrigation were carried out  and any bleeding controlled with bipolar coagulation.  Because of the   patient's large size and depth of the incision, it was elected to leave  a Jackson-Pratt drain in the prevertebral space.  This was  brought through a separate stab wound incision.  Wound was then closed  with interrupted Vicryl on the platysma muscle, inverted 5-0 PDS on the  subcuticular layer and Steri-Strips on the skin.  A sterile dressing and  soft collar applied and the patient was extubated and taken to recovery  room in stable condition.           ______________________________  Reinaldo Meeker, M.D.     ROK/MEDQ  D:  10/14/2007  T:  10/15/2007  Job:  [id number]

## 2010-05-28 NOTE — Assessment & Plan Note (Signed)
[facility]                            CARDIOLOGY OFFICE NOTE   HARSHA, YUSKO                          MRN:          [id number]  DATE:10/29/2006                            DOB:          February 06, 1945    FOLLOW-UP VISIT:   PRIMARY CARE PHYSICIAN:  Chauncey Reading, D.O.   REASON FOR VISIT:  Follow up cardiac disease.   HISTORY OF PRESENT ILLNESS:  Mr. Atienza returns for a routine visit.  He  was last seen in April 2008.  He is not reporting any significant angina  or breathlessness.  He has been more fatigued and notes that he has had  a weight gain since I last saw him.  He has gradually increased from 266  to 277, now up to 290.  His electrocardiogram shows sinus rhythm with  nonspecific ST-T wave changes, anterior T wave inversions that are old.  His medical regimen looks very reasonable.  He states that this  cholesterol has been followed by Dr. Landis Martins.  His Zocor remains at 80 mg  daily.  He has had some leg heaviness when he walks and was referred for  lower extremity arterial studies and ABIs, which were within normal  limits.  He has been reinitiated in the cardiac rehabilitation  maintenance program.  Today we talked about diet and exercise and plans  for continued risk factor modification.   ALLERGIES:  No known drug allergies.   PRESENT MEDICATIONS:  1. Carvedilol 6.25 mg p.o. b.i.d.  2. Zocor 80 mg p.o. daily.  3. Pepcid 20 mg p.o. b.i.d.  4. Imdur 60 mg p.o. daily.  5. Aspirin 81 mg p.o. daily.  6. Hydrochlorothiazide 25 mg p.o. daily.  7. Potassium 10 mEq p.o. daily.  8. Lisinopril 40 mg p.o. daily.   REVIEW OF SYSTEMS:  As described in the history of present illness.   </EXAMINATION>  Blood pressure is 108/72, heart rate is 70, weight is 290 pounds.  This is a morbidly obese male in no acute distress.  Examination of the neck reveals no elevated jugular venous pressure.  LUNGS:  Clear without labored breathing.  CARDIAC:  A  regular rate and rhythm, no rub, murmur or S3 gallop.  EXTREMITIES:  No pitting edema.   IMPRESSION AND RECOMMENDATIONS:  1. Multivessel coronary artery disease with normal ejection fraction,      status post coronary artery bypass grafting in 1999.  Grafts were      patent at angiography in March 2007.  He has not had any new      symptoms.  I suspect that some of his fatigue and decreased      exercise tolerance are related to increase in weight, nearly 30      pounds.  We talked about diet and exercise and I have encouraged      him to continue with cardiac rehabilitation maintenance program.  I      will otherwise plan to see him back over the next 6 months.  2. Hyperlipidemia.  Goal LDL should be around 70.     Remi Deter  Franky Macho, MD  Electronically Signed    SGM/MedQ  DD: 10/29/2006  DT: 10/30/2006  Job #: [id number]   cc:   Chauncey Reading, D.O.

## 2010-05-28 NOTE — Op Note (Signed)
NAME:  William [name], William [name] NO.:  [id number]   MEDICAL RECORD NO.:  [id number]          PATIENT TYPE:  AMB   LOCATION:  ENDO                         FACILITY:  [facility]   PHYSICIAN:  Shirley Friar, MDDATE OF BIRTH:  June 15, 1945   DATE OF PROCEDURE:  DATE OF DISCHARGE:                               OPERATIVE REPORT   COLONOSCOPY:   INDICATIONS:  Screening.   MEDICATIONS:  Fentanyl 60 mcg IV, Versed 6 mg IV.   FINDINGS:  Rectal exam was normal.  The adult Pentax colonoscope was  inserted into an adequately-prepped colon and advanced to the cecum,  where the ileocecal valve and appendiceal orifice were identified.  Careful withdrawal of the colonoscope revealed a 6-mm size pedunculated  polyp in the splenic flexure.  It was removed with snare cautery in  piecemeal technique.  In the proximal descending colon was a 4-mm  sessile polyp that was removed was snare cautery.  In the distal  descending colon with was an 8-mm pedunculated polyp that was removed  with snare cautery.  No immediate bleeding from polypectomy sites.  Retroflexion unremarkable.  No other mucosal abnormalities were seen.   ASSESSMENT:  Colon polyps x3, status post snare cautery x3.   PLAN:  1. Follow up on pathology.  2. No aspirin for 1 week.  3. Repeat colonoscopy in 3-5 years pending pathology.      Shirley Friar, MD  Electronically Signed     VCS/MEDQ  D:  11/05/2006  T:  11/06/2006  Job:  [id number]   cc:   Chauncey Reading, D.O.  Fax: [phone]

## 2010-05-28 NOTE — Assessment & Plan Note (Signed)
[facility]                            CARDIOLOGY OFFICE NOTE   GEORGIA, BARIA                          MRN:          [id number]  DATE:03/02/2007                            DOB:          10/10/1945    PRIMARY CARE PHYSICIAN:  Chauncey Reading, D.O.   REASON FOR VISIT:  Followup recent hospital stay.   HISTORY OF PRESENT ILLNESS:  Mr. Tuccillo was admitted to [facility] in  late January with an episode of chest pain.  He ruled out for myocardial  infarction and was referred for a CT scan of the chest which showed no  evidence of pulmonary embolus or acute cardiopulmonary disease.  He was  discharged home with a plan for a followup Myoview which was obtained on  February 9.  This study revealed a mild inferior lateral scar without  ischemia.  There was no peri-infarct ischemia nor evidence of anterior  wall ischemia, and ejection fraction was 55%.  Mr. Cordrey states that he  has had no further chest pain.  He has stable NYHA Class II dyspnea on  exertion.  He does report exercising through the cardiac rehabilitation  program, and I spoke with him again about trying to lose some more  weight through diet and exercise.   ALLERGIES:  No known drug allergies.   MEDICATIONS:  1. Carvedilol 12.5 mg p.o. b.i.d.  2. Aspirin 81 mg p.o. daily.  3. Imdur 60 mg p.o. daily.  4. Potassium 10 mEq p.o. daily.  5. Lisinopril 40 mg p.o. daily.  6. Hydrochlorothiazide 25 mg p.o. daily.  7. Pravastatin 80 mg p.o. nightly.  8. Pepcid 20 mg p.o. b.i.d.  9. Nitroglycerin 0.4 mg p.r.n.   REVIEW OF SYSTEMS:  As described in History of Present Illness.  Otherwise negative.   PHYSICAL EXAMINATION:  VITAL SIGNS:  Blood pressure 140/80, heart rate  is 76. Weight is 289 pounds.  GENERAL:  This is a morbidly obese male in no acute distress.  NECK:  Reveals no elevated jugular venous pressure. No loud bruits.  LUNGS:  Clear without labored breathing at rest.  CARDIAC:   Exam reveals a regular rate and rhythm.  No rub, murmur or  gallop  EXTREMITIES:  Exhibit no pitting edema.   IMPRESSION AND RECOMMENDATIONS:  1. Multivessel coronary disease status post coronary artery bypass      grafting in 1999 with patent grafts at angiography of 2007 and a      recent Myoview indicating no frank evidence of ischemia with      preserved ejection fraction.  The patient is symptomatically stable      at this point and ruled out for myocardial infarction during a      recent hospital stay.  I will plan to continue present medical      therapy.  I have encouraged him to continue with a strategy of      diet, exercise and goal of further weight loss.  We will plan to      see him back over the next 6 months.  2. Hyperlipidemia,  on statin  therapy.  This is followed by Dr.      Landis Martins.  Goal LDL should be around 70.     Jonelle Sidle, MD  Electronically Signed    SGM/MedQ  DD: 03/02/2007  DT: 03/03/2007  Job #: [id number]   cc:   Chauncey Reading, D.O.

## 2010-05-31 NOTE — Cardiovascular Report (Signed)
NAME:  William [name], William [name] NO.:  [id number]   MEDICAL RECORD NO.:  [id number]          PATIENT TYPE:  INP   LOCATION:  3705                         FACILITY:  [facility]   PHYSICIAN:  Salvadore Farber, M.D. LHCDATE OF BIRTH:  11-Jul-1945   DATE OF PROCEDURE:  11/27/2005  DATE OF DISCHARGE:  11/28/2004                              CARDIAC CATHETERIZATION   PROCEDURES:  1.  Left heart catheterization.  2.  Left ventriculography.  3.  Left internal mammary artery angiography.  4.  Saphenous vein graft angiography.  5.  In-situ right internal mammary artery angiography.  6.  Abdominal aortography.  7.  Star-Close closure of the right common femoral arteriotomy site.   INDICATIONS:  Mr. Lightner is a 65 year old gentleman with coronary artery  disease, for which he is status post coronary artery bypass grafting in  1999.  He presents to the hospital with severe chest discomfort.  He had a  similar presentation in September of this year.  Cardiac catheterization at  that time demonstrated patent grafts with good distal run-off.  Electrocardiogram today is unrevealing.   PROCEDURAL TECHNIQUE:  Informed consent was obtained.  Under 1% lidocaine  local anesthesia, a 5 French sheath was placed in the right common femoral  artery using the modified Seldinger technique.  Diagnostic angiography of  the native system was performed using JL-4 and JR-4 catheters.   Dictation ended at this point.      Salvadore Farber, M.D. [facility]     WED/MEDQ  D:  03/22/2005  T:  03/24/2005  Job:  [id number]   cc:   Learta Codding, M.D. [facility]  [address]  [address]  [place]  [place] [address]   Cleophas Dunker. Everardo All, M.D. [facility]  [address]  [place]  [place] [address]

## 2010-05-31 NOTE — Consult Note (Signed)
NAME:  William [name], William [name] NO.:  [id number]   MEDICAL RECORD NO.:  [id number]          PATIENT TYPE:  EMS   LOCATION:  MAJO                         FACILITY:  [facility]   PHYSICIAN:  Arvilla Meres, M.D. LHCDATE OF BIRTH:  29-Apr-1945   DATE OF CONSULTATION:  11/27/2004  DATE OF DISCHARGE:                                   CONSULTATION   PRIMARY CARE PHYSICIAN:  Dr. Chauncey Reading   CARDIOLOGIST:  Dr. Lewayne Bunting   CHIEF COMPLAINT:  Crushing chest pain concerning for unstable angina.   HISTORY OF PRESENT ILLNESS:  William [name] is a 65 year old male with a history  of coronary artery disease status post CABG in 1999 with chronic stable  angina who presents to the emergency room with 10/10 crushing chest pain.  As above, he has history of stable angina.  He underwent cardiac  catheterization for progressive chest pain on September 27, 2004.  This  showed an EF of 52% with patency of his grafts.  As far as his native  vessels were concerned his right coronary artery was totally occluded  distally.  His LAD had a distal 90% lesion after the insertion of the LIMA.  The circumflex just had moderate disease.  Since that time he has done  relatively well.  He continues to walk on a daily basis, but does have  exertional chest pain just about every day.  He saw Dr. Andee Lineman in the office  on October 14, 2004 who increased his nitrates.  This did not seem to help  significantly.  He says that he has been continuing to walk and yesterday  completed his normal walk without any significant difference in his chest  pain pattern.  However, this morning he awoke at 5 a.m. with substernal  chest pain.  This progressed until about 7:30 in the morning when it became  a 10/10 crushing chest pain like his previous heart pain.  He finally came  to the emergency room.  He has been treated with some nitroglycerin and had  some mild relief, but continues to have fairly significant pain.  He had  had  some mild shortness of breath, but denies any nausea, vomiting, or  radiation.  First set of cardiac markers are negative with troponin 0.01.  EKG is mildly abnormal with just a very slight T-wave change laterally.   REVIEW OF SYSTEMS:  He has a history of sleep apnea and wears CPAP.  He also  complains of headaches as well as some back pain and joint pain.  He also  complains of bilateral calf pain while walking.  Rest of review of systems  is negative except for HPI and problem list.   PROBLEM LIST:  1.  Coronary artery disease.      1.  Status post CABG at [facility] in 1999 with a left internal mammary artery          to the left anterior descending and septal perforator, saphenous          vein graft to obtuse marginal, and a right internal mammary artery  to the distal right coronary artery.      2.  Cardiac catheterization on September 27, 2004 with native vessel          disease, but all three grafts patent, ejection fraction 52%.      3.  Stable exertional angina.  2.  Hypertension.  3.  Hyperlipidemia.  4.  Obesity.  5.  Severe obstructive sleep apnea on CPAP.  6.  History of medication noncompliance secondary to finances.   CURRENT MEDICATIONS:  1.  Aspirin 325.  2.  Zetia 10.  3.  HCTZ 25.  4.  Lisinopril 20.  5.  Metoprolol 100 b.i.d.  6.  Zocor 40 a day.  7.  Hydralazine 10 q.i.d.  8.  Isosorbide dinitrate 20 b.i.d.  9.  K-Dur 20 mEq daily.   He has no known drug allergies.   SOCIAL HISTORY:  He lives in [place].  He is divorced.  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  Denies any  alcohol, tobacco, or drug use.   FAMILY HISTORY:  His mother died at 16 due to sudden cardiac death from an  MI.  His father died at the age of 10 with diabetes and hardening of the  arteries.  He has one brother who is deceased at age 67 with heart  problems, four sisters, three of which had bypass surgery, one of them  twice.  He has two  sisters who are deceased, one from heart problems and the  other from cancer.   PHYSICAL EXAMINATION:  GENERAL:  He is mildly uncomfortable but his  respirations are not significantly labored.  VITAL SIGNS:  Blood pressure 109/67, heart rate 84.  He is saturating 97% on  2 L.  HEENT:  Sclerae anicteric.  EOMI.  There is no xanthelasmas.  Mucous  membranes are moist.  NECK:  Supple.  There is no obvious JVD.  Carotids are 2+ without any  bruits.  There is no lymphadenopathy or thyromegaly.  CARDIAC:  Regular rate and rhythm with no murmurs, rubs, or gallops.  LUNGS:  Clear to auscultation.  CHEST:  Wall is tender to palpation.  ABDOMEN:  Obese, soft, nontender, nondistended with no hepatosplenomegaly.  No bruits.  No masses.  There are good bowel sounds.  EXTREMITIES:  Warm with no clubbing, cyanosis, edema.  Femoral pulses are 2+  bilaterally without any bruits.  DP pulses are 2+ bilaterally.  NEUROLOGIC:  Affect:  He is in some mild distress.  Cranial nerves II-XII  are intact.  He moves all four extremities without any difficulty.   EKG shows normal sinus rhythm at a rate of 73.  There are mildly abnormal T-  wave in V4 and V5.  These are nondiagnostic.  Sodium is 137, potassium 3.8,  creatinine 1.  Troponin 0.01, CK 152, MB 2.2.  White count is 4.2,  hemoglobin 13.3, platelets 192.   ASSESSMENT/PLAN:  1.  Chest pain.  This is concerning for unstable angina.  However, he does      have some atypical features.  Nonetheless, I do think we need to proceed      with cardiac catheterization today.  I have discussed the risks and      benefits with him and he agrees to proceed.  For now we will treat him      with heparin, nitroglycerin, aspirin, and beta blocker.  Will continue      to cycle his enzymes.  2.  Hypertension.  This is well controlled.  Continue current therapy.  3.  Hyperlipidemia.  Continue current therapy.  Will check fasting lipids in     the morning.  4.  Does  complain of exertional leg pain, but no evidence of peripheral      vascular disease on examination.  We will continue to follow and      consider ABIs.      Arvilla Meres, M.D. [facility]  Electronically Signed     DB/MEDQ  D:  11/27/2004  T:  11/27/2004  Job:  [id number]   cc:   Chauncey Reading, M.D.  Fax: [phone]   Learta Codding, M.D. [facility]  [address]  [address]  [place]  [place] [address]

## 2010-05-31 NOTE — Discharge Summary (Signed)
NAME:  William [name], William [name] NO.:  [id number]   MEDICAL RECORD NO.:  [id number]          PATIENT TYPE:  INP   LOCATION:  2905                         FACILITY:  [facility]   PHYSICIAN:  Alvester Morin, M.D.  DATE OF BIRTH:  01/23/1945   DATE OF ADMISSION:  09/25/2004  DATE OF DISCHARGE:  09/28/2004                                 DISCHARGE SUMMARY   COMMENT:  Continuity doctor will be established in the future at his  hospital followup visit.   CONSULTANTS:  1.  Dr. Myrtis Ser, Cardiology/Emily Andrey Campanile, P.A.  2.  Dr. Shawnie Pons.   DISCHARGE DIAGNOSES:  1.  Atypical chest pain with history of severe coronary artery disease.  2.  Right upper quadrant pain.  3.  Dysuria.  4.  Melena with epigastric pain.  5.  Peptic ulcer disease.   DISCHARGE MEDICATIONS:  1.  Aspirin 325 mg p.o. daily.  2.  Zetia 10 mg p.o. daily.  3.  Hydrochlorothiazide 25 mg daily.  4.  Lisinopril 20 mg p.o. daily.  5.  Metoprolol 100 mg p.o. b.i.d.  6.  Zocor 40 mg p.o. daily.  7.  Nitroglycerin 0.4 mg three doses within 15 minutes as needed for chest      pain.  8.  Hydralazine 10 mg by mouth four times a day.  9.  Isosorbide mononitrate 5 mg by mouth b.i.d.  10. Pepcid over the counter b.i.d.  11. K-Dur 20 mEq by mouth daily.   DISPOSITION:  Mr. [name] was discharged on September 28, 2004 after  catheterization the previous day showed preserved overall left ventricular  function with only a slightly reduced ejection fraction and continued  patency of his grafts x3.  Prior to discharge, his potassium had dropped to  3.5, previously 3.7, and so he was sent out on K-Dur 20 mEq.  His potassium  levels will need to be checked at his hospital followup next Wednesday.  We  will also need to arrange for followup with his cardiologist and establish a  continuity care doctor during his visit.  His hospital followup will be on  Wednesday, October 02, 2004, at 3 p.m.  The patient will also need a  future right upper quadrant ultrasound based on his right upper quadrant  pain at admission.  This will be monitored closely during his followup  visits and should it worsen, we will get a right upper quadrant ultrasound.   PROCEDURES PERFORMED:  1.  Heart catheterization done on September 27, 2004 by Dr. Shawnie Pons.      The catheterization revealed preserved overall left ventricular function      with slightly reduced ejection fraction and no definite wall motion      abnormalities.  It also showed continued patency of vein grafts      including the internal mammary saphenous vein graft and right external      mammary.  2.  Digital chest x-ray done on September 25, 2004, which showed mild      enlarged heart, no heart failure or infiltrate.  There is mild  atelectasis in the lung bases.   CONSULTATION:  The patient was consulted by Dr. Durenda Hurt,  P.A. on September 26, 2004.  They recommended that the patient undergo heart  catheterization, based on the fact that the patient states that his symptoms  of chest pain were exactly like prior to his bypass surgery.  It was also  recommended that he begin his medical management to reduce his risk  stratification for any future MIs.   ADMITTING HISTORY AND PHYSICAL:  This is a 65 year old African American male  who was admitted through the primary physician's office secondary to chest  discomfort.  He describes at least a 1- to 2-week history of anterior chest  burning without radiation.  He states that when he gets this burning, he  does have associated shortness of breath and diaphoresis that he describes  as a 7-8 on a scale of 0-10.  This discomfort and shortness of breath  usually go about within 15-30 minutes.  He has not noticed any difference  when taking aspirin, Maalox, Tums, Rolaids, nitroglycerin.  He states that  the discomfort can occur with any over-exertion, particularly after meals,  also with  driving.  He states it has awakened him from sleep.  He feels that  it is very similar prior to his bypass surgery.  He feels that when he gets  the discomfort it is somewhat pleuritic.  In clinic, his vital signs were  pulse 95, blood pressure 142/99, temperature 98.6, respirations 20, O2 SAT  97% on room air.  In general, he was noted to be in no apparent distress.  He did have pinpoint pupils bilaterally.  It was noted on respiration exam  that he had decreased air movements, increased expiration, scattered  rhonchi, but no crackles and wheezes.  Cardiovascular was within normal  limits.   LABORATORIES ON ADMISSION:  Sodium was 136, potassium 3.3, chloride 100, CO2  28, BUN 12, creatinine 1.0, glucose 97.  White blood cell count 8.8,  hemoglobin 14.6, hematocrit 41.5, platelets 160,000; MCV was 80.8.  Liver  enzymes:  Bilirubin 0.7, alkaline phosphatase 58, AST 23, ALT 26, protein  6.9, albumin 3.4, calcium 8.5.  ABG was 7.43/44.5/91.4/27.1/97.1.  D-dimer  was less than 0.22.  BNP was less than 130.  UA was within normal limits and  cardiac enzymes were set 1 -- creatine kinase 187, CK-MB 2.4, troponin I  0.01; set 2 -- creatine kinase was 206, CK-MB was 2.3, troponin I was 0.01.  His urine drug screen was positive only for opiates.  His urine creatinine  was 268.1.  His urinalysis was negative for bilirubin, ketones, blood,  protein, urobilinogen, nitrite and leukocytes.  His TSH was 1.529.   EKG was noted to be normal sinus rhythm and unchanged since previous EKG in  August 2006.   HOSPITAL COURSE:  PROBLEM #1 - ATYPICAL CHEST PAIN WITH TYPICAL AND ATYPICAL  FEATURES OF UNSTABLE ANGINA:  He was admitted to the telemetry and was ruled  out for myocardial infarction by cardiac enzymes and EKGs.  It was  concerning that the patient stated that it was exactly like prior to his  bypass surgery.  Dr. Myrtis Ser, the cardiologist, was consulted on September 26, 2004 and he underwent  catheterization on September 27, 2004 by Dr. Riley Kill.  The catheterization results were noted in the procedure section, but  basically showed patent grafts from his previous bypass surgery.  We did  restart him on his medications that would aggressively  reduce his risk  stratification for future cardiac events.  He will be scheduled for a  followup appointment with Dr. Andee Lineman on his hospital followup visit.  We did  arrange for the patient to be able to get a long-term supply of his  medications prior to discharge from the assistance program at [facility],  which the patient already had.  We also provided him with samples of Zetia  for a month.   PROBLEM #2 - LOW POTASSIUM ON DISCHARGE:  The patient's potassium was noted  to be 3.5 at discharge, down from 3.7 previously.  He was sent out on K-Dur  20 mEq, but his potassium will need to be rechecked at his hospital followup  on Wednesday, October 02, 2004.   PROBLEM #3 - RIGHT UPPER QUADRANT PAIN:  Only significant on physical exam  at admission, but otherwise no complaints throughout his hospitalization.  His liver function panel was within normal limits at admission.  We will  continue to monitor this during future followup visits in clinic.  Should it  worsen, or should liver functions increase, we will obtain a right upper  quadrant ultrasound in the future.   PROBLEM #4 - MELENA WITH EPIGASTRIC PAIN:  The patient complained of melena  at his clinic visit.  We tried to obtain a Hemoccult of stool, but the  patient did not have any bowel movement since admission.  We will do a full  peptic ulcer disease workup.  We will also consider any kind of EGDs or  colonoscopies in the future, should it continue to worsen.  This will be  closely followed up during future visits.   PROBLEM #5 - DYSURIA:  This was a complaint that the patient voiced during  his clinic visit.  His UA was within normal limits at admission.  He did not  have any  further complaints during his visit.  We will continue to monitor  in future followup visits.      Manning Charity, MD    ______________________________  Alvester Morin, M.D.    KK/MEDQ  D:  09/29/2004  T:  09/30/2004  Job:  [id number]   cc:   Learta Codding, M.D. [facility]  [address]  [address]  [place]  [place] [address]

## 2010-05-31 NOTE — Procedures (Signed)
NAME:  William [name], William [name] NO.:  [id number]   MEDICAL RECORD NO.:  [id number]          PATIENT TYPE:  OUT   LOCATION:  SLEEP CENTER                 FACILITY:  [facility]   PHYSICIAN:  Clinton D. Maple Hudson, M.D. DATE OF BIRTH:  08/01/1945   DATE OF STUDY:                              NOCTURNAL POLYSOMNOGRAM   REFERRING PHYSICIAN:  Dr. Coralie Carpen.   DATE OF STUDY:  October 14, 2004.   INDICATION FOR STUDY:  Hypersomnia with sleep apnea. Epworth sleepiness  score 14/24, BMI of 41. Weight 292 pounds. The baseline diagnostic NPSG was  done April 30, 2004 reporting an AHI of 85 per hour. C-PAP titration is  requested.   SLEEP ARCHITECTURE:  Total sleep time 74%. Stage I 15%, stage II 67%, stages  III and IV absent, REM 19% of total sleep time. Sleep latency 16 minutes,  REM latency 130 minutes, awake after sleep onset 92 minutes, arousal index  23. No bedtime medication was reported.   RESPIRATORY DATA:  C-PAP titration protocol. C-PAP was titrated to a  recommended initial pressure of 15 CWP, AHI 5.9 per hour. A large ComfortGel  mask was used with chin strap and heated humidifier.   OXYGEN DATA:  On therapeutic levels of C-PAP saturation held 96% on room  air.   CARDIAC DATA:  Sinus rhythm with PVCs.   MOVEMENT/PARASOMNIA:  Occasional leg jerk with arousal, 1.9 per hour,  clinically insignificant.   IMPRESSION/RECOMMENDATIONS:  1.  Successful C-PAP titration to 15 CWP, AHI of 5.9 per hour. A large      ComfortGel mask was used with chin strap and heated humidifier.  2.  Baseline NPSG on April 30, 2004 reported an AHI of 85 per hour.  3.  Best therapeutic results could be expected if the patient achieved      significant weight loss as well.      Clinton D. Maple Hudson, M.D.  Diplomate, Biomedical engineer of Sleep Medicine  Electronically Signed     CDY/MEDQ  D:  10/20/2004 13:10:10  T:  10/20/2004 23:23:52  Job:  [id number]

## 2010-05-31 NOTE — Discharge Summary (Signed)
NAME:  William [name], William [name] NO.:  [id number]   MEDICAL RECORD NO.:  [id number]          PATIENT TYPE:  INP   LOCATION:  3315                         FACILITY:  [facility]   PHYSICIAN:  Stacie Glaze, M.D. LHCDATE OF BIRTH:  02/09/1945   DATE OF ADMISSION:  02/08/2004  DATE OF DISCHARGE:  02/09/2004                                 DISCHARGE SUMMARY   DISCHARGE DIAGNOSES:  1.  Atypical chest pain.  2.  Blurred vision.  3.  Noncompliance with medications.   BRIEF ADMISSION HISTORY:  [name] is a 65 year old African-American male  who presented with several day history of chest pain.  He described it as a  pressure type that was moderate to severe across the chest, not worse with  exertion.  He has had some heart palpitations.  He notes that he ran out of  his blood pressure and cholesterol medications and is not currently taking  any medications.  He also describes a one week history of blurred vision.   PAST MEDICAL HISTORY:  1.  Hypertension.  2.  Dyslipidemia.  3.  Coronary artery disease status post CABG x3 in 1999, status post cardiac      catheterization in 2004 with patent grafts.  4.  Preserved LV function.  5.  History of goiter.   HOSPITAL COURSE:  #1 - CARDIOVASCULAR:  The patient presented with chest  pain.  Serial cardiac enzymes were negative.  EKG without ischemia.  The  patient was seen in consultation by cardiology.  They saw no evidence of  acute coronary ischemia.  It was recommended that his medical regimen be  resumed.  Dr. Andee Lineman also recommended an outpatient Cardiolite.  He also  recommended outpatient carotid Dopplers as his head CT on admission did show  some calcification of his right vertebral artery and bilateral carotid  arteries.   #2 - NONCOMPLIANCE WITH MEDICATIONS SECONDARY TO COST:  We have tried to  convert him to medications that are more reasonably priced.  Dr. Andee Lineman also  placed assistant organization papers on the chart  for the patient to help  assist with medication costs.   #3 - HYPERTENSION:  This was improved with medications.   #4 - HYPERLIPIDEMIA:  Fasting lipid profile is pending.   #5 - PERIPHERAL VASCULAR DISEASE:  Head CT on admission showed no acute  abnormalities; however, it did reveal moderate calcification in the right  vertebral artery and bilateral carotid arteries.  We will therefore pursue  an outpatient carotid Doppler.   LABORATORIES:  Discharge urinalysis was negative.  Cardiac enzymes, D-dimer,  coags, TSH, CBC with differential, CMET were all normal.  Fasting lipid  profile is pending.  Chest x-ray revealed cardiac enlargement with mild  vascular congestion.  Head CT was without any acute abnormalities.   DISCHARGE MEDICATIONS:  1.  Lisinopril 20 mg daily.  2.  Mevacor 40 mg daily.  3.  Aspirin 81 mg daily.  4.  Lopressor 50 mg b.i.d.   FOLLOWUP:  Follow up for carotid Dopplers February 7 at 11:15 a.m. and then  stress only  on February 7 at 12:30 p.m. and then follow up with Dr. Andee Lineman  Friday, February 17 at 11:30 a.m.      LC/MEDQ  D:  02/09/2004  T:  02/09/2004  Job:  [id number]

## 2010-05-31 NOTE — Discharge Summary (Signed)
NAME:  William [name], William [name] NO.:  [id number]   MEDICAL RECORD NO.:  [id number]                   PATIENT TYPE:  INP   LOCATION:  6522                                 FACILITY:  [facility]   PHYSICIAN:  Learta Codding, M.D. [facility]             DATE OF BIRTH:  17-Oct-1945   DATE OF ADMISSION:  03/07/2002  DATE OF DISCHARGE:  03/09/2002                           DISCHARGE SUMMARY - REFERRING   BRIEF HISTORY:  The patient is a 65 year old male with a history of  hypertension, hyperlipidemia and coronary artery disease.  He is status post  MI with three-vessel coronary artery bypass graft surgery in 1999 at [facility].  He was admitted through the [facility]  Emergency Room on 03/07/2002 by Dr. Gala Romney on 03/07/02 with symptoms  consistent with unstable angina.   PAST MEDICAL HISTORY:  1. History of coronary artery disease.  2. Status post MI in 1999.  3. Underwent three-vessel coronary artery bypass graft surgery at [facility] in 1999.  4. History of obesity.  5. History of hypertension.  6. History of hyperlipidemia.  7. History of medical noncompliance.  8. Goiter with previous work-up in the past.  Negative for malignancy by the     patient's report.   ALLERGIES:  No known drug allergies.   MEDICATIONS PRIOR TO ADMISSION:  1. Aspirin.  2. Vitamin C.  3. Vitamin E.   FAMILY HISTORY:  Noncontributory.   SOCIAL HISTORY:  The patient lives in [place].  He is divorced.  He is an  unemployed Copy.  He denies tobacco or drug use.  He drinks alcohol  occasionally.   HOSPITAL COURSE:  As noted, this patient was admitted to [facility]  through [facility] Emergency Room for evaluation of symptoms  consistent with unstable angina with previous history of coronary artery  disease.  The patient was found to have negative cardiac enzymes.  He was  scheduled for a cardiac catheterization  which was performed 03/07/02 by Dr.  Andee Lineman.  He was found to have severe native three-vessel coronary artery  disease.  The LIMA and the RIMA were patent.  The LIMA went to the LAD.  The  RIMA went to the RCA.  There was also a saphenous vein graft to the ramus  which was patent as well.  An LV-gram was not performed.  It was felt that a  2D echo could be obtained to evaluate his ejection fraction.   Arrangements were made to discharge the patient on 03/08/02, however, he was  having continued chest pain.  A decision was made to proceed with CT scan to  rule out a pulmonary embolus, also to evaluate the patient's known goiter.   An internal medicine consult was also obtained from [facility] as the patient does not have a primary care  physician.   The CT scan showed no pulmonary embolus or any other acute findings.  There  was a left thyroid mass questionable for neoplasm.  Arrangements were made  to discharge the patient on 03/09/02 in stable and improved condition with  further evaluation on an outpatient basis to be performed by Dr. Everardo All for  his abnormal thyroid goiter.  It should be noted that thyroid function tests  were within normal limits.   LABORATORY DATA:  Please see results of CT scan as noted above.  See results  of cardiac catheterization as noted above.   A CBC on 03/08/02 revealed a hemoglobin of 14.8, hematocrit 43.2, WBC 6000,  platelets 178,000.  A chemistry profile on 03/08/02 revealed a BUN of 8,  creatinine 0.9, potassium 4.2, glucose 119.  INR and PTT within normal  limits on 03/07/02.  Cardiac enzymes were negative.  A D-dimer was normal at  0.27.  A hemoglobin A1c was within normal range at 6.1.  A lipid profile  revealed a cholesterol of 252, triglycerides 104, HDL 40, LDL 191.  A drug  screen was positive for opiates and tetrahydrocannibinol.  A BNP was less  30.  T4 was within normal limits, T3 uptake within normal limits, TSH within  normal  limits.   DISCHARGE MEDICATIONS:  The patient did have some difficulty affording his  medications in the past.  He has limited financial resources.  A case  manager consult was obtained prior to discharge and the patient's  medications were pared to the bare essentials and he was discharged on:  1. Enteric-coated aspirin 81 mg daily.  2. Imdur 60 mg daily.  3. Hydrochlorothiazide 25 mg daily.  4. Toprol-XL 100 mg daily.  5. Zocor 40 mg at bedtime.  6. Nitroglycerin p.r.n. for chest pain.   The case manager was to fill the patient's Imdur, hydrochlorothiazide and  nitroglycerin through the hospital pharmacy.  Also an application was made  to the [employer] for assistance with the Zocor.  He would  also be instructed to obtain samples of Zocor and Toprol from the [facility]  cardiology office.   DISCHARGE INSTRUCTIONS:  1. Avoid any strenuous activity or driving for at least two days.  2. Do not lift more than 10 pounds for one week.  3. Stay on a low-salt, low-fat diet.  4. Call the [place] office if the patient has any increased pain, swelling     or bleeding from his groin.  5. The patient is scheduled to have an echocardiogram performed March 12 at     10:30 a.m. at the [facility] office.   FOLLOW UP:  The patient is to follow up with Dr. Andee Lineman March 12 at 11:30  a.m. with the physician assistant.  He would follow up with Dr. Andee Lineman April  20 at 12:30.  He would also see Dr. Everardo All Wednesday, March 10, at 3:15  p.m. for further evaluation of his goiter.  He was to obtain previous  records regarding his goiter from a physician in [place] who had evaluated  him in the past and he was to bring these to his appointment with Dr.  Everardo All.   PROBLEM LIST AT THE TIME OF DISCHARGE:  1. Chest pain, MI ruled out.  2. History of coronary artery disease with previous MI and coronary artery    bypass graft surgery at [facility] in 1999.  3. Cardiac  catheterization performed this admission showing severe native  three-vessel coronary artery disease with patent grafts.  4. Outpatient 2D echo planned.  5. History of hypertension.  6. History of obesity.  7. History of hyperlipidemia.  8. History of noncompliance.  9. History of goiter with negative work-up in the past for malignancy.  10.      CT scan performed this admission negative for pulmonary embolus.  11.      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  Further follow-up per Dr.     Everardo All.     Delton See, P.A. [facility]                  Learta Codding, M.D. [facility]    DR/MEDQ  D:  03/09/2002  T:  03/09/2002  Job:  [id number]   cc:   Gregary Signs A. Everardo All, M.D. [facility]

## 2010-05-31 NOTE — Cardiovascular Report (Signed)
NAMEMarland Kitchen  MCCOY, TESTA NO.:  [id number]   MEDICAL RECORD NO.:  [id number]          PATIENT TYPE:  INP   LOCATION:  2905                         FACILITY:  [facility]   PHYSICIAN:  Arturo Morton. Riley Kill, M.D. [facility] OF BIRTH:  [birth year]   DATE OF PROCEDURE:  09/27/2004  DATE OF DISCHARGE:                              CARDIAC CATHETERIZATION   INDICATIONS:  Mr. Brazie is a 65 year old gentleman with previous  revascularization surgery at [facility].  He has had some recurrent chest pain,  enzymes were negative, no acute EKG changes noted.  The current study was  done to assess coronary anatomy.   PROCEDURE:  1.  Left heart catheterization.  2.  Selective coronary arteriography.  3.  Selective left ventriculography.  4.  Saphenous vein graft angiography x1.  5.  Selective left internal mammary angiography.  6.  Right internal mammary angiography.   DESCRIPTION OF PROCEDURE:  The patient was brought to the catheterization  laboratory and prepped and draped in the usual fashion.  Through an anterior  puncture using a Smart needle, the right femoral artery was entered.  It was  a difficult entry due to the patient's size.  A 6-French sheath was then  easily placed.  Following this, views of left and right coronary arteries  were obtained in minimal projections due to their known total occlusion.  Following this, we used a wire and a mammary catheter to gain access to the  left internal mammary artery, which was fairly easy; gaining access to the  right was somewhat more difficult.  Using a guiding catheter and a wire, we  were eventually able to get up towards the subclavian and visualize the  right internal mammary reasonably well.  However, the patient had severe  back pain and was moving all over the table, despite fentanyl and Versed on  repeated basis.  We were able to get good subclavian shots which  demonstrated patency of the internal mammary all way down to its  insertion,  but it was difficult to see the native vessel, although it was fairly small.  Following this, central aortic and left ventricular pressures were measured  with a pigtail no acute distress ventriculography was formed in the RAO  projection.  Distal pulses were palpable in the patient, despite his severe  back pain and discomfort in the legs.  We took him off the table and into  the holding area for manual compression.  There were no known complications.   HEMODYNAMIC DATA:  1.  Central aortic pressure 135/86, mean 106.  2.  Left ventricular pressure 134/27.  3.  No gradient on pullback across the aortic valve.   ANGIOGRAPHIC DATA:  1.  Overall ventricular function appeared to be reasonably preserved.  We      were able to calculate the ejection fraction of 52% .  No definite wall      motion abnormalities were visualized.  2.  The left main terminated as a LAD, circumflex and intermediate and all 3      were totally occluded.  3.  The internal  mammary appears to insert probably into the septal      perforating vessel.  This then fills retrograde into what appears to be      an LAD.  The LAD has a lot of diffuse luminal irregularity with a 50%      stenosis at the mid and a 90% distally.  However, the insertion in the      mammary itself appears to be widely patent.  4.  The circumflex insertion into the large obtuse marginal also appears to      be intact.  There is a fair amount of ectasia, but the graft does not      appear to be highly compromised.  There is probably 40% eccentric      plaquing in the native vessel beyond the graft insertion.  5.  The right coronary artery has 80% proximal narrowing, then is totally      occluded after a diseased RV branch.  6  The right internal mammary appears to fill vigorously all the way down.  The distal right as well-seen, and as previously noted, the procedure was  complicated predominately by the patient's back pain.  There was  some  difficulty getting into the right arm, but we were able to get enough view  that demonstrated patency of the mammary itself.  Whether there is  compromise of the difficult distal right is difficult to tell; nonetheless,  flow into this area appeared to be relatively good.   CONCLUSION:  1.  Preserved overall left ventricular function with slightly reduced      ejection fraction and no definite wall motion abnormalities.  2.  Continued patency of the internal mammary to the septal perforator and      left anterior descending system.  3.  Continued patency of the saphenous vein graft to the obtuse marginal.  4.  Continued patency of the right sternal mammary to the distal right with      the above issues as noted.   DISPOSITION:  Based upon the above findings, continued medical therapy would  be warranted.  It would be difficult to do an injection to the right mammary  without going to the right arm.  The patient currently would not tolerated  it; based on this, we will recommend continued medical therapy.      Arturo Morton. Riley Kill, M.D. [facility]  Electronically Signed     TDS/MEDQ  D:  09/27/2004  T:  09/27/2004  Job:  [id number]   cc:   Learta Codding, M.D. [facility]  [address]  [address]  [place]  [place] [address]   [facility]

## 2010-05-31 NOTE — H&P (Signed)
NAME:  William [name], William [name] NO.:  [id number]   MEDICAL RECORD NO.:  [id number]          PATIENT TYPE:  OBV   LOCATION:  1825                         FACILITY:  [facility]   PHYSICIAN:  Everardo Beals. Juanda Chance, MD, FACCDATE OF BIRTH:  1945-04-08   DATE OF ADMISSION:  04/13/2006  DATE OF DISCHARGE:                              HISTORY & PHYSICAL   PRIMARY CARDIOLOGIST:  Jonelle Sidle, M.D.   PRIMARY CARE PHYSICIAN:  Chauncey Reading, D.O.   SUMMARY OF HISTORY:  Mr. Kines is a 65 year old African American male  who presents to [facility] complaining of chest discomfort.  He  states that since 11:00 p.m. last night he has noted an anterior chest  pressure radiating straight through to his back as well as into his  neck, posterior neck, bilateral upper extremities and lower legs.  He  also notes and upper and lower extremity numbness and tingling.  He  gives the discomfort a 9 on a scale of 0 to 10.  He states that he took  a sublingual nitroglycerin around midnight without any changes to his  discomfort.  He also describes associated nausea, shortness of breath  and diaphoresis.  He denies any recent accidents or injuries.  The  discomfort is pleuritic and tender to the touch.  He feels it is similar  to his myocardial infarction in 1999.   ALLERGIES:  NO KNOWN DRUG ALLERGIES.   MEDICATIONS:  Medications prior to admission include lisinopril 20  milligrams p.o. daily, Imdur 60 milligrams daily, potassium chloride 10  milliequivalents daily, Pepcid 21 milligrams b.i.d., metoprolol 100  milligrams  b.i.d., aspirin 81 milligrams daily and nitroglycerin 0.4  p.r.n..  He states he has not had hydrochlorothiazide 25 milligrams  daily and Vytorin unknown dosage for at least three days.  He says that  he ran out of these and he can not get them filled until his check comes  in next month.   PAST MEDICAL HISTORY:  The past medical history is notable for severe  obstructive  sleep apnea with chest pain in May of 2006, he does wear a  CPAP, obesity, chronic back pain evaluated at [facility], hypertension,  hyperlipidemia, he has a history of a myocardial infarction with three-  vessel bypass surgery in 1999 at [facility] with a left internal mammary  artery to the left anterior descending coronary artery, left internal  mammary artery to the right coronary artery and a saphenous vein graft  to the obtuse marginal branch.  The last complete cardiac  catheterization dictation available to me was on 04/09/2005.  This  showed native three-vessel coronary artery disease with an ejection  fraction of 55 percent,  mild inferoposterior hypokinesis, patent left  internal mammary artery to the left anterior descending coronary artery  and left internal mammary artery to the right coronary artery, ectatic  saphenous vein graft to the obtuse marginal branch.  There is a partial  dictation of a catheterization from 11/2005 by Dr. Samule Ohm however this  dictation was not completed.  The patient is unsure of the last  echocardiogram or  stress test.  He has a past history of depression,  anxiety and a history of noncompliance secondary to finances.  The  patient states that he has also had a clot in his leg in the past that  was discovered last year within the office and states that he is on a  blood thinner however, he does is not familiar with the term Coumadin or  anticoagulation Clinic.   PAST SURGICAL HISTORY:  The surgical history is notable for a thyroid  mass resection in 05/2004 and a left inguinal hernia repair.   SOCIAL HISTORY:  The patient resides in [place] alone.  He is  divorced.  He has one sons and two grandchildren.  No great  grandchildren.  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  He states that Medicaid is  due to take effect in August.  He does not smoke.  He states that he  used to drink a lot of liquor  but now not so much.  He cannot quantify.  He denies any drugs or herbal medications.  He states that he may walk  from a half mile to a mile two to three times a week.  He  describes a  low-fat and a low salt diet.   FAMILY HISTORY:  The patient's mother is deceased at the age of 102 with  myocardial infarction.  Father died at unknown age with a myocardial  infarction.  He has a sister that has had a bypass and brother with a  myocardial infarction.   REVIEW OF SYSTEMS:  The review of systems in addition to above is  notable for chills, however, the patient has not checked his  temperature.  He feels that he has gained some weight however, he does  not know how much.  He wears reading glasses and needs cataract surgery.  He also has many broken teeth.  He has chronic dyspnea on exertion that  has not changed recently and chronic two-pillow orthopnea which has not  changed.  He has coughing productive of white yellow phlegm.  He has  dysuria, nocturia, generalized weakness, numbness, depression,  occasional abdominal discomfort and constipation.   PHYSICAL EXAMINATION:  GENERAL CONDITION:  In general a well-nourished,  well-developed obese African American male in no apparent distress.  VITAL SIGNS:  Temperature is 97.5.  Initial blood pressure in the  emergency room was 155/92 and is now 118/79, pulse 63, respirations 20,  97 percent sat on room air.  HEAD, EYES, EARS, NOSE AND THROAT:  Grossly unremarkable except for poor  dentition.  NECK:  The neck is supple without thyromegaly, adenopathy,  jugular venous distention or carotid bruits.  CHEST:  The chest had symmetrical excursion.  Lungs sounds were  diminished but clear to auscultation.  HEART:  The point of maximal impulse is not displaced.  Distant heart  sounds.  Regular rate and rhythm.  I do not appreciate a S3-S4, murmurs,  rubs, clicks or gallops.  All pulses were symmetrical and intact.  I did not appreciate any  abdominal or femoral bruits.  SKIN:  Integument is intact.  ABDOMEN:  Obese.  Bowel sounds present without organomegaly, masses or  tenderness.  EXTREMITIES:  Negative cyanosis, clubbing or edema.  MUSCULOSKELETAL:  No joint deformity or effusions.  He does have  reproducible discomfort with upper extremity movement and palpation.  NEUROLOGICAL:  Grossly unremarkable.  His upper and lower extremity  strength  is 5+ bilaterally.   SPECIAL STUDIES:  The chest x-ray shows cardiomegaly with mild pulmonary  vascular congestion.  The electrocardiogram shows a normal sinus rhythm  of 16, normal axis and intervals, early R-wave, nonspecific ST-T wave  changes not changed from electrocardiograms from 10/2005 and 03/2005.  I-  STAT performed in the emergency room showed an hemoglobin and hematocrit  of 14.6 and 43.0.  Sodium 140, potassium 4.1, BUN 10, glucose 102, D-  dimer less than 0.22.  partial thromboplastin time 25, prothrombin time  13.2.  Point of care markers were within normal limits x 2   TREATMENT IN THE EMERGENCY ROOM:  In the emergency room he received  morphine 4 milligrams intravenously, a GI cocktail, sublingual  nitroglycerin and intravenous heparin without relief.   IMPRESSION:  1. Prolonged chest discomfort, probably musculoskeletal in etiology      but also concerning for ischemia.  2. Hypertension.  3. Noncompliance with medications secondary to finances.  4. History as listed per past medical history.   DISPOSITION:  I will contact the office to obtain old medical records  and a possible history of deep venous thrombosis.  We will admit to rule  out myocardial infarction and asked the case manager to assist with  finances.  Will consider prescribing Zocor versus Vytorin since Zocor is  available at [facility] for four dollars.  He was given intravenous  Demerol in the emergency room with only a slight reduction of his chest  discomfort.  Dr. Everardo Beals. Juanda Chance has reviewed  the patient's history,  spoke with and examined the patient and agrees with the above.  Given  his continued chest discomfort we will perform a cardiac catheterization  to further evaluate his symptoms and history of coronary artery disease.      Joellyn Rued, PA-C      Bruce R. Juanda Chance, MD, [facility]  Electronically Signed    EW/MEDQ  D:  04/13/2006  T:  04/13/2006  Job:  [id number]   cc:   Chauncey Reading, D.O.  Jonelle Sidle, MD

## 2010-05-31 NOTE — Cardiovascular Report (Signed)
NAME:  [name], William NO.:  [id number]   MEDICAL RECORD NO.:  [id number]          PATIENT TYPE:  OIB   LOCATION:  2899                         FACILITY:  [facility]   PHYSICIAN:  Rollene Rotunda, M.D.   DATE OF BIRTH:  09/07/1945   DATE OF PROCEDURE:  04/09/2005  DATE OF DISCHARGE:  04/09/2005                              CARDIAC CATHETERIZATION   PRIMARY CARE PHYSICIAN:  Dr. Chauncey Reading, [facility].   CARDIOLOGIST:  Dr. Andee Lineman.   PROCEDURE:  Left heart catheterization/coronary arteriography.   INDICATION:  Evaluate the patient with chest pain and a Myoview  demonstrating inferior infarct and mild peri-infarct ischemia and anterior  ischemia.   PROCEDURAL NOTE:  Left heart catheterization was performed via the right  femoral artery.  The artery was cannulated using an anterior wall puncture.  A #6-French arterial sheath was inserted via the modified Seldinger  technique.  A preformed Judkins and a pigtail catheter were utilized.  The  patient tolerated the procedure well and left the lab in stable condition.   RESULTS:   HEMODYNAMICS:  LV 141/24, Ao 137/112.   CORONARIES:  The left main had a distal 70% stenosis.   The LAD was occluded at the ostium.  The mid and distal vessels filled via  the LIMA.  There was distal 50% followed by apical 60% stenosis.   The circumflex was occluded at the ostium.  There was a large mid obtuse  marginal filling the vein graft.  There was mid 50% stenosis and diffuse  luminal irregularities.   The right coronary artery was dominant.  It was occluded midway through  after an 80% obstruction proximally.  The distal PDA and posterolaterals  were seen to fill via the intact right internal mammary artery graft.  There  was diffuse distal vessel RCA disease; these were small, narrow-caliber  segments.   GRAFTS:  LIMA to the septal perforator was widely patent.  This graft  backfilled the LAD.  It was  difficult to visualize the anastomosis; there  did not appear to be a high-grade lesion.   Intact right internal mammary artery graft to the right coronary artery was  not selectively cannulated.  However, flush injections were adequate to see  that the graft was without any obstruction.  There was distal native vessel  disease, as described.  Saphenous vein graft to the obtuse marginal was  diffusely ectatic with luminal irregularities, but no high-grade obstructive  lesions.   LEFT VENTRICULOGRAM:  Left ventriculogram was obtained in the RAO  projection.  The EF was 55% with mild inferior hypokinesis.   CONCLUSION:  Severe native three-vessel coronary artery disease.  Patent  grafts as described.   PLAN:  Given the above, the patient will need to have continued medical  management.  We will increase his Imdur to 60 mg daily.  He will continue  with aggressive risk reduction.           ______________________________  Rollene Rotunda, M.D.     JH/MEDQ  D:  04/09/2005  T:  04/11/2005  Job:  [id number]  cc:   [facility]

## 2010-05-31 NOTE — Cardiovascular Report (Signed)
NAME:  WILLEY, DUE NO.:  [id number]   MEDICAL RECORD NO.:  [id number]          PATIENT TYPE:  OBV   LOCATION:  1825                         FACILITY:  [facility]   PHYSICIAN:  Everardo Beals. Juanda Chance, MD, FACCDATE OF BIRTH:  1945-03-20   DATE OF PROCEDURE:  04/13/2006  DATE OF DISCHARGE:                            CARDIAC CATHETERIZATION   CLINICAL HISTORY:  Mr. Strausser is 65 years old and had bypass surgery  performed in 1999.  He was cathed last fall and was found to have  nonobstructive coronary disease.  He presented this morning to the  emergency department with an episode of prolonged chest pain that began  last night and substernal pressure which he describes as similar to the  time when he had his heart attack prior to his bypass surgery.  He also  complained of numbness and tingling in his legs.  His initial ECG showed  a possible diaphragmatic wall infarction and T-wave inversion in V1 and  V2 which had not changed.  Point of care markers were negative.  We  elected to bring him to the cath lab for further evaluation because of  ongoing chest pain.   PROCEDURE:  The procedure was performed by the right femoral artery,  arterial sheath and 6-French preformed coronary catheters.  A front wall  arterial puncture was performed and Omnipaque contrast was used.  The  right femoral artery was closed with Angio-Seal at the end of the  procedure.  The patient tolerated the procedure well and left the  laboratory in satisfactory condition.   RESULTS:  The aortic pressure was 153/96 with a mean of 119.  Left  ventricular pressure is 153/31.   Left main coronary artery:  The left main coronary artery is free of  significant disease.   Left anterior descending artery:  Please note the left anterior  descending artery was completely occluded near its origin.   The circumflex artery __________ gave rise to a ramus branch which was  completely occluded and the circumflex  proper was completely occluded  near its origin.   The right coronary artery was completed occluded at its mid portion  after a right ventricular branch.   The saphenous vein graft to the ramus branch of the circumflex artery  was patent and functioned normally.  There were irregularities but no  significant obstruction.   The LIMA graft to the distal right coronary was patent. There was 30  percent narrowing at the anastomosis.  The distal right coronary artery  consisted of a posterior descending and two posterolateral branches.   The LIMA graft and LAD was patent.  It appeared to fill both a small  diagonal branch and then connect sequentially to the LAD proper.  The  anatomy was a little unusual in that the connection to the diagonal  graft appeared to be on the septal side of the LAD but this may have  been related to patient orientation.   Left ventriculogram:  The left ventriculogram performed in performed in  the RAO projection showed mild global hypokinesis with an estimated  fraction of  45%.   An aortic root injection was performed to rule out an aortic dissection  and this showed no evidence of aortic insufficiency and no dissection.   __________ was performed which showed patent renal arteries and no  significant aortoiliac obstruction.   CONCLUSION:  1. Coronary artery disease status post coronary bypass graft surgery      in 1999.  2. Severe native vessel disease with total occlusion of the LAD,      circumflex and right coronaries.  3. Patent LIMA graft to the distal right coronary artery with  30      percent narrowing at the distal anastomoses, patent vein graft to      the ramus branch of the circumflex artery with irregularities and      patent sequential LIMA graft to the diagonal branch and the LAD.   Mild left ventricular global hypokinesis with an estimated ejection  fraction of 45%.   The left ventriculogram performed in the RAO projection showed  mild  global hypokinesis with estimated ejection fraction of 60%.   RECOMMENDATIONS:  All the grafts are patent and the patient has no  source of ischemia.  We will look for other causes for the patient's  chest pain.  We did an aortic root to rule out aortic dissection and  will get a D-dimer to screen for pulmonary embolism.  It is possible  that some of his symptoms could be related to reflux.  He also has leg  pain and will get a chest x-ray to evaluate him for lumbosacral spine  disease.   ADDENDUM:  The distal AV circumflex filled via collaterals from the  distal right coronary.      Bruce Elvera Lennox Juanda Chance, MD, [facility]  Electronically Signed     BRB/MEDQ  D:  04/13/2006  T:  04/13/2006  Job:  [id number]   cc:   Jonelle Sidle, MD  Everardo Beals. Juanda Chance, MD, [facility]  Cardiopulmonary Lab  [facility]

## 2010-05-31 NOTE — Assessment & Plan Note (Signed)
[facility]                            CARDIOLOGY OFFICE NOTE   DEQUINCY, BORN                          MRN:          [id number]  DATE:05/08/2006                            DOB:          1945/01/26    PRIMARY CARE PHYSICIAN:  Chauncey Reading, DO.   REASON FOR VISIT:  Cardiac followup.   HISTORY OF PRESENT ILLNESS:  I saw Mr. Hink back in November.  His  main complaints center on leg and back discomfort and I presume are  related to previously diagnosed lumbar disk disease.  He states that he  was seen in the hospital a few weeks ago and had many x-rays related  to these symptoms.  He has had intermittent chest pain, but nothing  progressive.  His electrocardiogram today shows sinus bradycardia at 57  beats per minute with no significant changes in comparison to the  previous tracing.  He did have a followup lipid profile in the interim  showing an LDL cholesterol of 101 down from 117 on Zocor 40 mg daily.  We received some communication from his pharmacy about potentially  changing to Lovastatin; however, I discussed with him the fact that  Zocor can be obtained as a generic at certain pharmacies for $4.00 a  month.  I would actually prefer that he increase his Zocor to 80 mg  daily based on his present lipid profile, and I doubt that Lovastatin  would lower his levels to goal as effectively.  Otherwise, I have  recommended to him that he see his primary care Hasan Douse and perhaps  have a referral back for Orthopedic assessment of his back.  He had  already been evaluated by a specialist at [facility] in the past.   ALLERGIES:  No known drug allergies.   MEDICATIONS:  1. Hydrochlorothiazide 25 mg p.o. daily.  2. Lisinopril 40 mg p.o. daily.  3. Potassium chloride 10 mEq p.o. daily.  4. Zocor 40 mg p.o. daily.  5. Lopressor 100 mg p.o. b.i.d.  6. Aspirin 81 mg p.o. daily.  7. Imdur 60 mg p.o. daily.   REVIEW OF SYSTEMS:  As described  in the History of Present Illness.  Otherwise, negative.   PHYSICAL EXAMINATION:  VITAL SIGNS:  Blood pressure is 135/85, heart  rate is 57, weight is 277 pounds.  GENERAL:  He is an obese male in no acute distress.  NECK:  No elevated jugular venous pressure without bruits.  No  thyromegaly is noted.  LUNGS:  Clear without labored breathing at rest.  CARDIAC:  Regular rate and rhythm, no S3, gallop, rub, or murmur.  EXTREMITIES:  No pitting edema.   IMPRESSION AND RECOMMENDATIONS:  1. Multivessel coronary artery disease with preserved ejection      fraction, status post coronary artery bypass grafting in 1999.  His      bypass grafts were found to be patent at angiography in March of      2007.  He is not manifesting any progressive symptoms, and his      electrocardiogram shows no significant changes.  I would suggest      continued medical therapy and will plan to see him back over the      next six months.  2. Hyperlipidemia.  I have recommended increase in Simvastatin to 80      mg in the evening.  He should be able to obtain this generic      without changing to a different preparation.     Jonelle Sidle, MD  Electronically Signed    SGM/MedQ  DD: 05/08/2006  DT: 05/08/2006  Job #: [id number]   cc:   Chauncey Reading, D.O.

## 2010-05-31 NOTE — Consult Note (Signed)
NAME:  William [name], GETMAN NO.:  [id number]   MEDICAL RECORD NO.:  [id number]          PATIENT TYPE:  INP   LOCATION:  1826                         FACILITY:  [facility]   PHYSICIAN:  Jonelle Sidle, M.D. LHCDATE OF BIRTH:  1945/04/19   DATE OF CONSULTATION:  02/08/2004  DATE OF DISCHARGE:                                   CONSULTATION   CARDIOLOGIST:  Learta Codding, M.D. [facility]   REASON FOR CONSULTATION:  Hypertension and chest pain.   HISTORY OF PRESENT ILLNESS:  Mr. Kronk is a 65 year old male with a history  of coronary artery disease status post three-vessel coronary artery bypass  grafting in 1999 at [facility] as outlined below.  He also has a  history of hypertension, dyslipidemia, and medication noncompliance.  He has  not taken any of his antihypertensives over the last six months stating that  he has not been able to afford them.  He has been using some samples of  Lipitor, but has obtained no other medicines.  He states that he has been  having some problems with blurry vision recently and had his blood pressure  checked at work today noting it to be 200/108.  He went to see his primary  care physician and was referred to the emergency department for further  assessment.  In addition to this, he describes a fairly chronic history of  intermittent chest tightness.  This is sporadic and not specifically  worsened with activity.  He states that he simply takes a few aspirin and  goes about his usual activities.  He has also had some problems with right  shoulder pain for the last four months.  This has been nonexertional.  He  also has chronic shortness of breath.   ALLERGIES:  No known drug allergies.   Present medications reportedly include Lipitor 80 mg p.o. daily.  In  reviewing the patient's previous cardiac records, he was previously taking  Toprol XL 100 mg p.o. daily, Zocor 40 mg p.o. daily, Imdur 60 mg p.o. daily,  enteric coated aspirin 81 mg  p.o. daily, Hydrochlorothiazide 25 mg p.o.  daily, and multivitamins.   PAST MEDICAL HISTORY:  1.  Coronary artery disease status post coronary artery bypass grafting in      1999 at [facility].  At the time, he had a LIMA to the left      anterior descending, RIMA to the right coronary artery, and saphenous      vein graft to the ramus intermedius.  Last catheterization in 2004      reportedly showed patent grafts.  I do not have the formal report at      hand.  2.  Hypertension.  3.  History of goiter.  4.  Dyslipidemia.   SOCIAL HISTORY:  The patient lives in [place]. He works at [employer] as  a Administrator, Civil Service.  He does not smoke cigarettes.  He drinks alcohol  occasionally.  He also drinks green tea.  He denies any other illicit  substance use.   FAMILY HISTORY:  Noncontributory at present.  REVIEW OF SYSTEMS:  As described in history of present illness and detailed  in the physician's assistant work sheet.   PHYSICAL EXAMINATION:  VITAL SIGNS:  Blood pressure 116/70 now, down from  162/104 in the left arm; 160/102 in the right arm by primary care physician.  Heart rate 83,  respirations 22, oxygen saturation 99% on two liters nasal  cannula.  GENERAL:  This is an obese male lying in bed in no acute distress.  HEENT:  Conjunctivae normal.  Pharynx is clear.  NECK:  Supple without elevated jugular venous pressure, without carotid  bruits. __________is noted.  LUNGS:  Clear to auscultation bilaterally.  CARDIAC:  Reveals a regular rate and rhythm with an S4, but no S3 gallop and  no pericardial rub.  ABDOMEN:  Soft and nontender with normal active bowel sounds.  EXTREMITIES:  Show no pitting edema.  Peripheral pulses are 2+.   LABORATORY DATA:  WBC is 4.4, hemoglobin 14.6, platelets 168, sodium 140,  potassium 3.8, BUN 9, creatinine 0.9, glucose 102.  Initial troponin I less  than 0.01.   As 12-lead electrocardiogram from earlier today shows sinus rhythm with   nonspecific ST-T wave changes and small largely nondiagnostic Q waves in the  inferior leads.  Most significant in lead III only.   Chest x-ray is reported as showing cardiomegaly with some vascular  congestion, but no frank edema or infiltrates.   IMPRESSION:  1.  Chest pain syndrome that is fairly chronic and not necessarily      reproducible.  This is in a 65 year old gentleman with a known history      of coronary artery disease, status post coronary artery bypass grafting      as outlined with apparently patent bypass grafts and angiography in      2004.  His initial cardiac markers are negative.  His electrocardiogram      is nonspecific.  2.  Hypertension, not controlled.  This is complicated by medication      noncompliance as outlined.  3.  Reported history of dyslipidemia, on Lipitor samples.  I am not certain      how consistent he has been with this.  4.  Reported history of goiter.   RECOMMENDATIONS:  1.  Agree with admission to cycle cardiac markers. Unless these are      abnormal, would anticipate a followup Cardiolite in terms of his repeat      ischemic testing given the chronic nature of the patient's symptoms and      the fact that he had patent bypass grafts in 2004.  Otherwise, if      enzymes are clearly abnormal we will entertain cardiac catheterization      once blood pressure is better controlled.  2.  Will begin a basic medical regimen including low-dose Toprol XL and      Diovan with the addition of aspirin.  Will follow along and titrate as      required.  3.  Followup fasting lipid profile.      SGM/MEDQ  D:  02/08/2004  T:  02/08/2004  Job:  [id number]

## 2010-05-31 NOTE — Consult Note (Signed)
NAME:  William [name], William [name] NO.:  [id number]   MEDICAL RECORD NO.:  [id number]          PATIENT TYPE:  INP   LOCATION:  2905                         FACILITY:  [facility]   PHYSICIAN:  Willa Rough, M.D.     DATE OF BIRTH:  11-12-1945   DATE OF CONSULTATION:  DATE OF DISCHARGE:  09/26/2004                                   CONSULTATION   HISTORY OF PRESENT ILLNESS:  William [name] is a 65 year old African American  male who was admitted through his primary physician's office secondary to  chest discomfort.  He describes an at least 1-2 week history of anterior  chest burning without radiation.  He states when he gets this burning, he  does have associated shortness of breath and diaphoresis and gives it as 7-8  on a scale of 0 to 10.  He states that he tries to rest and relax and the  discomfort usually goes away within 15-30 minutes.  He has tried taking  aspirin, Maalox, Tums, Rolaids, and nitroglycerin, and he has not noticed  any appreciable changes.  He states that the discomfort can occur with any  over exertion and particularly after meals.  This has also occurred while  driving and he states that it has awakened him from sleep.  He feels it is  very similar prior to his bypass surgery.  He feels that when he gets the  discomfort it is somewhat pleuritic.  He denies any associated gas or water  brash.  However, he does walk daily approximately 10-20 minutes which brings  on dyspnea and calf discomfort but dyspnea on exertion not specifically  bring on the chest burning.   ALLERGIES:  No known drug allergies.   MEDICATIONS PRIOR TO ADMISSION:  Diltiazem CD 180 daily, Vytorin 10/40  daily, aspirin 81 daily, Lisinopril 20 daily, HCTZ 25 daily, he said he ran  out of nitroglycerin approximately one week ago.   PAST MEDICAL HISTORY:  His history is notable for hypertension.  He does not  check his blood pressures at home.  He has a history of hyperlipidemia.  It  is  unknown his last check.  There is reports of peripheral vascular disease  but I do not see any documentation in the records of ABIs or angiograms.  He  has a history of medication noncompliance secondary finances, depression,  severe obstructive sleep apnea, based on a sleep study of April 26, 2004, by  Dr. Shelle Iron.  He has never gotten CPAP or followed up.  He had a myocardial  infarction in the late 90s, however, records are not available.  He  underwent cardiac catheterization and bypass surgery at [facility].  He received a  LIMA to the LAD, saphenous vein graft to the ramus, LIMA to the RCA,  specifics are not available.  His last Adenosine Cardiolite was on February 27, 2004, at [facility].  His EF was 36% without ischemia.  His last  cardiac catheterization was at [facility] on March 07, 2002,  this showed severe native three vessel coronary artery disease.  I  cannot  find where a left ventriculogram was performed.  It was noted that his LIMA  to the LAD was patent, saphenous vein graft to the ramus was also patent,  however, he had a 40% distal anastomosis lesion.  The RIMA to the RCA was  not well visualized, however, it was noted that he had irregular disease in  his distal vessel.  His history is also notable for cerebrovascular disease  with a head CT in January 2006 showing calcified bilateral carotids and a  right vertebral.  Carotid Dopplers in the outpatient service and these were  negative.   SOCIAL HISTORY:  He resides in [place] alone.  He is currently  unemployed.  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  His one son and two  grandchildren are all alive and well.  He has been separated for over 10  years.  He denies any alcohol, tobacco, or drug use.  He drinks green tea.  He states he maintains a low salt diet, and he walks 10-20 minutes everyday.   FAMILY HISTORY:  Notable  for the death of his mother at 43 with a sudden  myocardial infarction and history of hypertension.  His father died at the  age of 61 with artery hardening and a history of diabetes.  He has one  brother who is deceased at age 56 with heart problems, four sisters, three  of which have had bypass surgery, one of them twice, his other sister does  have coronary artery disease.  He has two sisters who are deceased, one from  heart problems, the other from cancer.   REVIEW OF SYSTEMS:  Notable for a 15-20 pound weight gain over the last six  months, occasional dizziness and blurry vision which seems to get worse when  he feels his blood pressure is elevated.  Also positive for poor  dentition, he has not seen a dentist in over a year.  He states that he has  a right cataract with a blood clot.  Chronic dyspnea on exertion, however,  worse lately, occasional coughing with yellow phlegm, bilateral calf  discomfort, however, this eases with walking.  Orthopnea where he sleeps in  a recliner.  Denies PND.  He describes dysuria, depression secondary to the  inability to obtain a job.  Abdominal discomfort associated with  constipation.   PHYSICAL EXAMINATION:  GENERAL:  Well developed, well nourished, obese African American male in no  apparent distress.  VITAL SIGNS:  Temperature 97.2, T-max is 97.3, blood pressure 122/52, pulse  70, respirations 20, 95% saturations on 2 liters.  Intake has been 303,  output 325, admission weight 286.2.  HEENT:  Normocephalic, atraumatic, PERRLA, poor dentition.  NECK:  Supple without thyromegaly, adenopathy, JVD, or carotid bruits.  CHEST:  Symmetrical excursion.  Lung clear to auscultation without rales,  rhonchi, or wheezing.  HEART:  Distant heart sounds, regular rate and rhythm.  I did not appreciate  any murmurs, rubs, clicks, or gallops.  SKIN/INTEGUMENT:  Intact without lesions. ABDOMEN:  Obese, bowel sounds present without organomegaly, masses, or   tenderness.  EXTREMITIES:  No cyanosis, clubbing, or edema.  All peripheral pulses were  symmetrical and intact.  MUSCULOSKELETAL:  Unremarkable.  NEUROLOGICAL:  Unremarkable.   CURRENT MEDICATIONS:  Lisinopril 20 mg p.o. daily, Protonix 40 mg daily,  Zocor 40 q.h.s., Lopressor 50 mg q.6h., aspirin 325 mg daily, KCL 40  x 2, IV  heparin, nitroglycerin, and normal saline.   LABORATORY DATA:  Chest x-ray shows mild cardiomegaly.  EKG shows normal  sinus rhythm, normal intervals with some inferior Q-waves.  H&H is 14.6 and  41.5, normal indices, platelets 160, WBC 8.8.  Sodium 136, potassium 3.9  which has been corrected to 3.9.  BUN 12, creatinine 1, glucose 97.  Normal  LFTs.  TSH 1.529, D-dimer less than 0.22.  BNP less than 30.  Urinalysis was  negative.  Drug screen was positive for opiates.  Total cholesterol was 153,  triglycerides 87, HDL 35, LDL 101.   IMPRESSION:  1.  Typical and atypical features of unstable angina, however, it is      concerning that the patient states this is exactly like prior to his      bypass surgery.  He is ruled out for myocardial infarction by enzymes      and EKGs thus far.  2.  Hyperlipidemia.  3.  Hypokalemia, resolved.  4.  Multiple social concerns in regards to finances.  5.  Multiple cardiac risk factors.  6.  History as previously.   DISPOSITION:  Dr. Myrtis Ser spoke with, reviewed the patient's chart, and  examined the patient, and agrees with the above.  We agree with  discontinuing Cardizem and beginning a beta blocker for cardiac protection,  especially if his ejection fraction is truly 36% by Adenosine Cardiolite  from February 2006.  Consider resuming his Vytorin at 10/80 given his low  HDL and elevated LDL.  He will need fasting lipids and LFTs rechecked in  approximately 6-8 weeks if his medications are increased.  In regards to  ischemic evaluation, his symptoms are very concerning for progressive  coronary artery disease.  We have  arranged cardiac catheterization for this  afternoon.  We have made  him n.p.o. at this time.  We will obtain a STAT PT and PTT prior to his  cardiac catheterization this afternoon.  The patient understands the  procedure, risks, and benefits, and agrees to proceed.  Would recommend case  management involvement early to assist with financial concerns and possibly  medications prior to discharge.      Joellyn Rued, P.A. [facility]    ______________________________  Willa Rough, M.D.    EW/MEDQ  D:  09/26/2004  T:  09/26/2004  Job:  [id number]   cc:   Learta Codding, M.D. [facility]  [address]  [address]  [place]  [place] [address]

## 2010-05-31 NOTE — H&P (Signed)
NAME:  William [name], William [name] NO.:  [id number]   MEDICAL RECORD NO.:  [id number]                   PATIENT TYPE:  EMS   LOCATION:  ED                                   FACILITY:  [facility]   PHYSICIAN:  Arvilla Meres, M.D. [facility]          DATE OF BIRTH:  06/15/1945   DATE OF ADMISSION:  03/07/2002  DATE OF DISCHARGE:                                HISTORY & PHYSICAL   ATTENDING PHYSICIAN:  Dr. Cankton Bing.   PRIMARY CARE PHYSICIAN:  None.   CARDIOLOGIST:  None.   HISTORY OF PRESENT ILLNESS:  The patient is a 65 year old obese black male  with a history of hypertension, hyperlipidemia, and CAD, status post  myocardial infarction and three-vessel CABG in 1999 at [facility].  He is admitted  through the [facility] with unstable angina.   The patient denies any problem with chest pain since his CABG until tonight  at approximately 6 p.m. when after eating he developed progressive chest and  abdominal discomfort similar to his previous angina.  He says he went to  bed, but chest pain awoke him.  He was unable to go back to sleep.  He  complained of radiation to both arms, diaphoresis, and shortness of breath.  He came to the ER due to the progressive chest discomfort.  On arrival to  the ER he was markedly hypertensive with a blood pressure of 202/128.  He  was treated with three sublingual nitroglycerin, 5 mg of Lopressor, aspirin,  and morphine with complete resolution of his chest pain.  He did note a  marked response to the nitroglycerin.  He states that prior to tonight he  has been able to do all his ADLs and walk up to one-half mile without any  difficulty.   REVIEW OF SYSTEMS:  He denies fevers, chills, orthopnea, PND, edema, melena,  bright red blood per rectum.  He does endorse prominent snoring and marked  daytime sleepiness.   PAST MEDICAL HISTORY:  1. CAD, status post MI in 1999, status post three-vessel CABG at [facility] in     1999.  2. Obesity.  3. Hypertension.  4. Hyperlipidemia.  5. Medical noncompliance.  6. Goiter with previous workup negative for malignancy by his report.   MEDICATIONS:  Aspirin and vitamin C and vitamin E.   ALLERGIES:  No known drug allergies.   SOCIAL HISTORY:  He lives in [place].  He is divorced.  He is an  unemployed Copy.  He denies tobacco or drug use.  He says he did drink  alcohol occasionally.   FAMILY HISTORY:  Noncontributory.   PHYSICAL EXAMINATION:  GENERAL:  Obese.  Mildly dyspneic but able to speak  in full sentences.  VITAL SIGNS:  Afebrile.  Blood pressure initially was 208/128 but currently  is 136/81, heart rate is 77, saturation 99% on 2 L, respiratory rate is 24.  HEENT:  Sclerae anicteric.  NECK:  Supple.  He has a very large goiter in the midline.  I am unable to  assess his JVD due to his body habitus.  His carotids are 2+ bilaterally  without bruits.  CARDIAC:  Regular rate and rhythm.  No murmurs or gallops.  LUNGS:  Clear to auscultation.  ABDOMEN:  Obese, nontender, mildly distended.  EXTREMITIES:  No cyanosis, clubbing, or edema.  His femoral pulses are 2+  bilaterally without bruits.  His DP pulses are 2+ bilaterally.   LABORATORY DATA:  White count is 4.7, hematocrit is 45, platelets are 167.  Sodium is 136, potassium is 3.9, BUN is 8, creatinine is 0.9, glucose is  120.  Liver panel is normal.  CK is 176, troponin is 0.01.   EKG is a technically poor tracing.  There is normal sinus rhythm at 99 with  inferior Q-waves and occasional PVCs.  There is no evidence of ischemia.   Chest x-ray shows a question of mild pulmonary edema.  His trachea is  markedly deviated to the right.   ASSESSMENT AND PLAN:  This is a 65 year old male, as above, with a history  of coronary artery disease who presents with unstable angina.  His workup is  also notable for a large goiter with deviated trachea and a possible history  of sleep apnea.  For now we will:   1. Cycle cardiac markers.  Also check amylase, lipase, and a BNP.  2. Treat him with aspirin, beta blocker, nitroglycerin, Lovenox, statin, and     Plavix.  Should his cardiac markers turn positive, we will also add a 2B-     3A inhibitor such as Integrilin.  3. He will need a cardiac catheterization.  4. Will likely also need a neck and chest CT to evaluate goiter and consider     endocrine consult.  I have ordered a thyroid profile.  5. We will also order to check urine drug screen, fasting lipid profile, and     hemoglobin A1C.  6. Finally, an outpatient sleep study should be considered.                                               Arvilla Meres, M.D. [facility]    DB/MEDQ  D:  03/07/2002  T:  03/07/2002  Job:  [id number]

## 2010-05-31 NOTE — Cardiovascular Report (Signed)
NAME:  William [name], SEIDER NO.:  [id number]   MEDICAL RECORD NO.:  [id number]          PATIENT TYPE:  INP   LOCATION:  3705                         FACILITY:  [facility]   PHYSICIAN:  Salvadore Farber, M.D. LHCDATE OF BIRTH:  [birth year]   DATE OF PROCEDURE:  11/27/2004  DATE OF DISCHARGE:  11/28/2004                              CARDIAC CATHETERIZATION   PROCEDURES:  1.  Left heart catheterization.  2.  Left ventriculography.  3.  Nonselective LIMA and RIMA angiography of the saphenous vein graft.  4.  StarClose closure of the right femoral arteriotomy site.  5.  Abdominal aortography.   INDICATIONS FOR PROCEDURE:  Mr. Dills is a 65 year old gentleman with  coronary artery disease for which he is status post coronary artery bypass  grafting in 1999.  He has had prior cardiac catheterization for severe chest  discomfort that have revealed patent grafts with good distal runoff.  He is  ruled out for myocardial infarction on those occasions. The most recent was  in September of this year.  He now presents with again severe crushing  10/10 substernal chest discomfort.  Electrocardiogram is unchanged from  previous.  He is brought emergently to the cardiac catheterization lab due  to his symptoms.   PROCEDURAL TECHNIQUE:  Informed consent was obtained.  Under 1% lidocaine  local anesthesia, a 5 French sheath was placed in the right common femoral  artery using modified Seldinger technique.  Diagnostic angiography of the  native left and right coronaries were performed using JL4 and JR4 catheters.  JR4 catheter was used to selective engage the saphenous vein graft to the  ramus intermedius.  I used a no-torque right catheter to obtain access to  the right subclavian.  I could not engage it selectively in the RIMA.  Therefore, nonselective RIMA angiography was performed.  I used a LIMA  catheter to gain access to the left subclavian.  Again, I could not  selectively engage  the left internal mammary artery due to proximal  tortuosity.  Nonselective angiography was performed.  Finally, left heart  catheterization and ventriculography was performed using a pigtail catheter.  The pigtail catheter was then pulled back to the suprarenal abdominal aorta.  Abdominal aortography was performed by power injection.  Finally, the  arteriotomy was closed using a StarClose device.  Complete hemostasis was  obtained.  He was then transferred to the holding room in stable condition  having tolerated the procedure well.   COMPLICATIONS:  None.   FINDINGS:  1.  LV:  122/12/24.  EF 65% without regional wall motion abnormalities.  2.  No aortic stenosis or mitral regurgitation.  3.  Left main:  Angiographically normal.  4.  LAD:  The vessel was occluded at its ostium.  The LIMA to LAD is widely      patent and appears to fill a large diagonal in a retrograde fashion.      Runoff is not well visualized due to the nonselective nature of the      injection.  However, it appears to be excellent.  5.  Ramus intermedius:  Vessel is occluded at its ostium.  The saphenous      vein graft to the ramus intermedius is widely patent without significant      stenosis and with excellent distal runoff.  6.  Circumflex:  The vessel is totally occluded proximally.  7.  RCA:  The vessel is occluded in its mid section after the takeoff of a      acute marginal.  The in situ RIMA to distal RCA is widely patent.      Runoff is suboptimally visualized due to the nonselective nature of the      injection.  However, it appears good.  8.  Abdominal aorta:  Normal infrarenal abdominal aorta without significant      plaquing and no evidence of aneurysm.  9.  Renal arteries:  There are single renal arteries bilaterally.  Both are      angiographically normal.   IMPRESSION/RECOMMENDATIONS:  There has been no evident change in his  coronary anatomy compared with September 27, 2004.  The etiology of  his  resting chest discomfort is not clear. Will observe him closely in  hospital  for at least 24 hours.      Salvadore Farber, M.D. [facility]  Electronically Signed     WED/MEDQ  D:  03/22/2005  T:  03/24/2005  Job:  [id number]

## 2010-05-31 NOTE — Discharge Summary (Signed)
NAME:  William [name], William [name] NO.:  [id number]   MEDICAL RECORD NO.:  [id number]          PATIENT TYPE:  INP   LOCATION:  3705                         FACILITY:  [facility]   PHYSICIAN:  William [name], M.D. LHCDATE OF BIRTH:  04-Jul-1945   DATE OF ADMISSION:  11/27/2004  DATE OF DISCHARGE:  11/28/2004                                 DISCHARGE SUMMARY   PRINCIPAL DIAGNOSIS:  Chest pain.   OTHER DIAGNOSES:  1.  Coronary artery disease status post coronary artery bypass grafting x 4.  2.  Hypertension.  3.  Hyperlipidemia.  4.  Obesity.  5.  Severe obstructive sleep apnea.   MEDICATIONS:  Nonadherent secondary to finances.   ALLERGIES:  No known drug allergies.   PROCEDURES:  Left heart cardiac catheterization.   PRIMARY CARE PHYSICIAN:  William [name], M.D.   HISTORY OF PRESENT ILLNESS:  65 year old white male with prior history of  CAD status post CABG in 1999 with chronic stable angina who recently  underwent catheterization in September 2006 revealing EF 52% with patency of  all his grafts.  Since his discharge in September, he has had intermittent  exertional chest discomfort relieved with rest.  On the morning of admission  he awoke at 5 a.m. with chest discomfort that by 7:30 a.m. progressed to 10  out of 10 crushing chest pain like previous angina.  He came into the [facility] ED where he was treated with nitroglycerin with mild relief but  continued to have fairly significant pain and mild shortness of breath.  Cardiac markers were negative and EKG was mildly abnormal with very slight T-  wave change laterally.  The patient was admitted for further evaluation.   HOSPITAL COURSE:  Following admission, [name] was taken to the cardiac  cath lab where left heart cardiac catheterization was performed revealing  continued patency of his grafts with native three vessel disease.  The EF  was measured at 65% without regional wall motion abnormalities.  Post  procedure, the patient's isosorbide mononitrate dose was increased to 30 mg  b.i.d.  He has also been initiated on PPI therapy.  He has not had any  additional chest discomfort and is being discharged home today in  satisfactory condition.   LABORATORY DATA:  Hemoglobin 13.7, hematocrit 40.1, WBC 5.1, platelets 204.  Sodium 135, potassium 3.9, chloride 104, CO2 27, BUN 7, creatinine 1.9,  glucose 154.  Total bilirubin 1.5, alkaline phos 56, AST 24, ALT 28, total  protein 6.4, albumin 2.9, calcium 8.1.  Cardiac markers are negative x 3.   DISPOSITION:  The patient is being discharged home in good condition.   DISCHARGE INSTRUCTIONS:  He will have a follow up appointment with Dr.  Andee [name] in 2-3 weeks.   DISCHARGE MEDICATIONS:  Aspirin 325 mg daily, Lisinopril 20 mg daily,  Lopressor 100 mg b.i.d., Vytorin 40/10 mg q.h.s., Hydralazine 10 mg q.i.d.,  isosorbide mononitrate 30 mg b.i.d., K-Dur 20 mEq daily, HCTZ 25 mg daily,  Prilosec OTC 20 mg daily, nitroglycerin 0.4 mg sublingual p.r.n. chest pain.   Outstanding lab  studies none.  Duration of discharge encounter 30 minutes  including physician time.      William Anis, NP      William [name], M.D. [facility]  Electronically Signed    CRB/MEDQ  D:  11/28/2004  T:  11/28/2004  Job:  [id number]   cc:   William [name], M.D.  Fax: [phone]

## 2010-05-31 NOTE — Discharge Summary (Signed)
NAME:  CHAMPION, CORALES NO.:  [id number]   MEDICAL RECORD NO.:  [id number]          PATIENT TYPE:  INP   LOCATION:  6531                         FACILITY:  [facility]   PHYSICIAN:  Everardo Beals. Juanda Chance, MD, FACCDATE OF BIRTH:  Nov 12, 1945   DATE OF ADMISSION:  04/13/2006  DATE OF DISCHARGE:  04/14/2006                         DISCHARGE SUMMARY - REFERRING   DISCHARGE DIAGNOSES:  1. Prolonged chest discomfort of uncertain etiology.  2. Hyperkalemia, resolved.  3. Hyperlipidemia.  4. Noncompliance with medications (patient did not take an      hydrochlorothiazide and Vytorin for several days prior to admission      as he stated that he ran out.  He is not due to receive a check      until next month.)  5. Degenerative joint disease.  6. Obesity.  7. Obstructive sleep apnea.  8. History as listed below.   PROCEDURES PERFORMED:  1. Cardiac catheterization on April 13, 2006 by Dr. Juanda Chance.   SUMMARY OF HISTORY:  Mr. Artola is a 65 year old African-American male  who presented to [facility] complaining of chest discomfort.   He described an anterior chest pressure radiating to his back since 11  p.m. on the evening prior to admission.  He states that the discomfort  also radiates into his posterior neck, both arms and both legs, as well  as some numbness in his extremities.  He states that last night around  midnight he did take one aspirin and a sublingual nitroglycerin without  relief.  Throughout the night he had some intermittent nausea, shortness  of breath and diaphoresis.  He gave the discomfort a 10 on a scale of 0-  10.  He denies any recent accidents or injuries.  However, his  discomfort is pleuritic and tender to the touch.  He feels his symptoms  are similar to his myocardial infarction in 1999.   PAST MEDICAL HISTORY:  1. Obesity.  2. Severe obstructive sleep apnea with hypoxia, requiring CPAP.  3. Chronic back pain.  4. Hypertension.  5.  Hyperlipidemia.  6. Three-vessel bypass surgery at [facility].  Last catheterization showed      native 3-vessel coronary artery disease and patent graft, EF of 55%      with mild inferior posterior hypokinesis.  7. 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  8. Left inguinal hernia repair.  9. Unknown last echo or Myoview.  10.Depression/anxiety.  11.Noncompliance with medications secondary to finances.   LABORATORY DATA:  Lumbar spine showed degenerative disc disease in L5  and S1, facet arthropathy effects of the lower lumbar spine.  Chest x-  ray revealed low volume films with cardiomegaly and mild vascular  congestion.  Weight on the 1st was 126.4 kg.  Admission H&H was 13.4 and  40.0, normal indices.  Platelets 153, WBC is 4.3.  PTT 170, PT 13.8.  D-  dimer 0.3.  Sodium 138, potassium 3.7, BUN 8, creatinine 0.74.  Normal  LFTs.  Hemoglobin A1C was elevated at 6.3.  CK-MBs and troponins were  within normal limits times three.  Fasting lipids showed a total  cholesterol of 152, triglycerides 62, HDL 34, LDL 106.  TSH was 1.997.  Prior to discharge, sodium was 134, potassium 3.5, BUN 5, creatinine  0.93.  EKG on admission showed normal sinus rhythm, normal axis, early R  wave, nonspecific ST&T wave changes.  T wave inversion in V1 and V2,  which was unchanged from prior EKGs.   HOSPITAL COURSE:  Dr. Juanda Chance, after evaluation, given his prolonged  episode of chest discomfort and multiple cardiac risk factors with known  coronary disease felt that he should undergo cardiac catheterization.  Mr. Adamczak was agreeable.  This was performed on April 13, 2006 without  difficulty.  Dr. Juanda Chance noted an EF of 45%, mild global hypokinesis, no  dissection, no aortic insufficiency, without peripheral vascular disease  in the renal or aortic iliac system.  LIMA to the diagonal and LAD  saphenous vein graft to the OM, RIMA to the RCA were all patent.  Case  management assisted with discharge  medications and provided the patient  with a list at [facility] and [facility] of $4 medications.  Also gave  information in regards to [facility] as he has not uses [employer] in  [place], he may be able to save $30 worth of medications.  _________ bedrest.  The patient was ambulating without difficulty.  Respiratory assisted with CPAP overnight.  On the morning of the 1st,  Dr. Juanda Chance, after evaluation, felt that the patient could be discharged  home with outpatient follow up with his primary care physician.   DISPOSITION:  The patient is discharged home.  He is asked to maintain a  low sodium, heart healthy diet.  Wound care and activities are dictated  per supplemental discharge sheet.  He was asked to call Dr. Landis Martins to  arrange a one- to two-week appointment.  Further outpatient evaluation  should be given to possible diabetes.  He will follow up with Dr.  Diona Browner on May 08, 2006 at 3:45.  He received new prescriptions for  Zocor 40 mg daily at bedtime to take the place of Vytorin.  HCTZ 20 mg  daily.  Both of these prescriptions are available at [facility] or [facility]  for $4 a month.  He is advised to continue aspirin 81 mg daily,  lisinopril 20 mg daily, Imdur 60 mg daily, KCl 10 mEq daily, Pepcid 20  mg twice daily, metoprolol 100 mg twice daily, nitroglycerin 0.4 as  needed.  He was advised to bring all medications to all appointments.   DISCHARGE TIME:  Thirty-five minutes.      Joellyn Rued, PA-C      Bruce R. Juanda Chance, MD, [facility]  Electronically Signed    EW/MEDQ  D:  04/14/2006  T:  04/14/2006  Job:  [id number]   cc:   Chauncey Reading, D.O.  Jonelle Sidle, MD  Barbaraann Share, MD,FCCP

## 2010-05-31 NOTE — H&P (Signed)
NAME:  ASEEL, UHDE NO.:  [id number]   MEDICAL RECORD NO.:  [id number]          PATIENT TYPE:  OIB   LOCATION:  1827                         FACILITY:  [facility]   PHYSICIAN:  Dorian Pod, NP    DATE OF BIRTH:  March 01, 1945   DATE OF ADMISSION:  01/15/2005  DATE OF DISCHARGE:                                HISTORY & PHYSICAL   PRIMARY CARDIOLOGIST:  Learta Codding, M.D.   PRIMARY CARE DOCTOR:  Chauncey Reading, D.O., [facility].   HISTORY OF PRESENT ILLNESS:  Mr. Packard is a 65 year old African-American  gentleman with presents to [facility] emergency room with complains of  shortness of breath and chest tightness.  Mr. Donigan has a known history of  coronary artery disease status post MI in the late 1990s, status post  coronary artery bypass graft at [facility], with left internal mammary artery to  the left anterior descending and septal perforator, vein graft to the obtuse  marginal and right internal mammary artery graft to the distal right  coronary artery.  The last cardiac catheterization in September of 2006 with  an EF of 52% with continued patency of the bypass grafts.  Mr. Dakin  presents today complaining of chest tightness.  He describes it as a  crushing chest pain, shortness of breath since 7:30 p.m. yesterday.  The  patient states he took 2 nitroglycerin at home with some relief; however,  the pain has been continuous since that time associated with palpitations,  headache, diaphoresis at onset of chest pain and a cough.  The patient also  complains of leg pain, tightness and cramping for the last few weeks with  walking.  The patient gets some relief at rest.  Mr. Oshields also complains  of somewhat pleuritic type pain at times, abdominal fullness with increased  belching.  Currently, he is resting quietly on the stretcher with no acute  distress.   ALLERGIES:  No known drug allergies.   MEDICATIONS:  1.  HCTZ 25.  2.  Potassium  20 mEq.  3.  Metoprolol 100 b.i.d.  4.  Isosorbide 30 daily.  5.  Zocor 40 daily.  6.  Pepcid 40 mg daily.  7.  Lisinopril 20 daily.  8.  Zetia 10 mg daily.  (The patient states he ran out of his Zetia a couple      of days ago.)   PAST MEDICAL HISTORY:  1.  Coronary artery disease.  2.  Hypertension.  3.  Hyperlipidemia.  4.  Obesity.  5.  Severe obstructive sleep apnea evaluated by Dr. Shelle Iron.  The patient      with a CPAP q.h.s.  6.  History of noncompliance with medications secondary to finances.  7.  Some depression anxiety.  8.  Peptic ulcer disease.  9.  History of melena.  10. History of right upper quadrant pain with unknown etiology.   SOCIAL HISTORY:  The patient lives alone.  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  He is separated.  Denies any tobacco use,  drugs  or __________ medication.  ETOH - recent use during Christmas.  The  patient states he drank some eggnog.  Diet - somewhat compliant.   FAMILY HISTORY:  Mother deceased at age 97 with a history of MI.  Father  deceased secondary to coronary artery disease and myocardial infarction,  unknown age.  Siblings - he has a sister who has had a triple bypass and a  brother who has had an MI.   REVIEW OF SYSTEMS:  Positive for chest pain, dyspnea on exertion,  arthralgia, myalgia, pain in back, GERD symptoms.  All other systems  negative per patient.   PHYSICAL EXAMINATION:  VITAL SIGNS:  Temperature 97.5, pulse 105,  respirations 24, blood pressure 144/69, satting 93% on room air, 100% on 2  liters.  GENERAL:  He is in no acute distress.  Obese African-American gentleman.  HEENT:  Pupils equal, round and reactive to light.  Sclerae clear.  NECK:  Supple without lymphadenopathy.  Negative bruit or JVD.  CARDIOVASCULAR:  Heart rhythm S1 and S2 slightly tachycardic without  murmurs, rubs, or gallops.  CHEST:  Clear to auscultation bilaterally.  ABDOMEN:  Round, soft, nontender,  nondistended, obese.  Bowel sounds present  x4.  EXTREMITIES:  Without clubbing or cyanosis.  He has a trace of edema of the  bilateral lower extremities.  There were 2+ DPs bilateral.  Warm and dry.  NEUROLOGIC:  Alert and oriented x3.  Cranial nerves II-XII grossly intact.   CHEST X-RAY:  Chest x-ray showed cardiomegaly, no acute disease.   EKG:  EKG at a rate of 112, normal sinus rhythm, without ST-T wave changes.   LABORATORY DATA:  White blood cell count of 4, hemoglobin 14.9, hematocrit  43.4 with a platelet count of 182,000.  Sodium 137, potassium 3.4, BUN 12,  creatinine 0.9, glucose 96.  D-dimer is less than 0.22.  Point of care  markers - troponin less than 0.05 x2.  AST of 28, ALT 31.   Dr. Willa Rough in to examine and assess the patient with presentation of  chest pain somewhat typical and atypical.  Currently relieved with a GI  cocktail in the setting of known history of coronary artery disease status  post CABG.  The patient will be admitted for observation to telemetry.  Rule  out myocardial infarction.  If no further episode of chest discomfort and  cardiac enzymes are negative, the patient can be discharged home in the a.m.   FOLLOW UP:  1.  Follow up outpatient with Dr. Andee Lineman.  2.  Follow up with primary care for possible GI work-up.   Will start the patient on a low-dose aspirin.  Adjust medications if needed.  May need to increase his isosorbide.  Will need an outpatient work-up for  peripheral vascular disease, possible ABI study.  This can be arranged  outpatient.      Dorian Pod, NP     MB/MEDQ  D:  01/15/2005  T:  01/15/2005  Job:  [id number]

## 2010-05-31 NOTE — Procedures (Signed)
NAME:  William [name], William [name] NO.:  [id number]   MEDICAL RECORD NO.:  [id number]          PATIENT TYPE:  OUT   LOCATION:  SLEEP CENTER                 FACILITY:  [facility]   PHYSICIAN:  Marcelyn Bruins, M.D. [facility] DATE OF BIRTH:  July 14, 1945   DATE OF STUDY:  04/30/2004                              NOCTURNAL POLYSOMNOGRAM   REFERRING PHYSICIAN:  Romero Belling, MD   INDICATIONS FOR STUDY:  Hypersomnia with sleep apnea.   SLEEP ARCHITECTURE:  The patient had total sleep time 260 minutes, however,  never achieved REM or slow wave sleep. Sleep efficiency was only 63%. Sleep  onset was very prolonged at 102 minutes.   IMPRESSION:  1.  Severe obstructive sleep apnea/hypopnea syndrome with a respiratory      disturbance index of 85 events per hour and O2 desaturation as low as      73%. A split night study was not done secondary to most of the patient's      obstructive events occurring after midnight. Treatment for this      particular severity of sleep apnea primarily involves weight loss      coupled with CPAP.  2.  Moderate to loud snoring noted throughout the study.  3.  Occasional PACs with fusion beats.      KC/MEDQ  D:  05/13/2004 13:42:42  T:  05/13/2004 15:07:15  Job:  [id number]

## 2010-05-31 NOTE — Discharge Summary (Signed)
NAME:  William [name], WEIGELT NO.:  [id number]   MEDICAL RECORD NO.:  [id number]          PATIENT TYPE:  OIB   LOCATION:  2034                         FACILITY:  [facility]   PHYSICIAN:  Vida Roller, M.D.   DATE OF BIRTH:  08-Mar-1945   DATE OF ADMISSION:  01/15/2005  DATE OF DISCHARGE:  01/16/2005                                 DISCHARGE SUMMARY   PROCEDURES:  None.   DISCHARGE DIAGNOSES:  1.  Chest pain, relieved by gastrointestinal cocktail.  Cardiac enzymes      negative for myocardial infarction with no further episodes of chest      pain.  2.  Status post aortocoronary bypass surgery in the 1990s, at [facility] with left internal mammary artery to the left      anterior descending and septal perforator as well as saphenous vein      graft to obtuse marginal and right internal mammary artery to distal      right coronary artery.  3.  Status post cardiac catheterization in September 27, 2004, with      continued patency of the bypass grafts with severe native three-vessel      disease and ejection fraction of 52%.  4.  Hypertension.  5.  Hyperlipidemia.  6.  Obesity.  7.  Obstructive sleep apnea.  8.  History of medication noncompliance secondary to finances.  The patient      states that he is doing a better job of getting his medications.   HOSPITAL COURSE:  William [name] is a 65 year old male with a history of  coronary artery disease.  He had crushing chest pain and shortness of breath  for approximately 16 hours.  He got some relief with nitroglycerin, but the  pain was continuous and associated with palpitations.  He got a headache  from the nitroglycerin and also had some diaphoresis as well as coughing.  He had leg pain described as a tightness and cramping as well.  The pain was  somewhat pleuritic at times with abdominal fullness and increased belching.  He got relief from his symptoms with a GI cocktail and was admitted to rule  out MI.   His cardiac enzymes were negative for MI and he had no further recurrence of  chest pain.  Mr. Wallen was ambulating without chest pain or shortness of  breath and considered stable for discharge on January 16, 2005, without  patient followup arranged.   LABORATORY DATA AND X-RAY FINDINGS:  Potassium on admission was 3.4.  This  was supplemented and was 3.5 at discharge.  Other labs were within normal  limits with a D-dimer less than 0.22.   ACTIVITY:  As tolerated.   FOLLOW UP:  He is to contact our office for further episodes of chest pain  and otherwise is to follow up with Dr. Andee Lineman on February 20, at 11 a.m.  He  is to follow up with Dr. Landis Martins as scheduled.   SPECIAL INSTRUCTIONS:  He is to continue taking his medications and  compliance was encouraged.  DISCHARGE MEDICATIONS:  1.  Hydrochlorothiazide 25 mg a day.  2.  Imdur 30 mg a day.  3.  Lisinopril 20 mg a day.  4.  Metoprolol 100 mg b.i.d.  5.  Potassium 20 mEq a day.  6.  Pepcid 40 mg a day.  7.  Zocor 40 mg a day.  8.  Zetia 10 mg a day.      Theodore Demark, P.A. [facility]      Vida Roller, M.D.  Electronically Signed    RB/MEDQ  D:  01/16/2005  T:  01/16/2005  Job:  [id number]   cc:   Chauncey Reading, D.O.  Fax: [phone]   Learta Codding, M.D. [facility]  [address]  [address]  [place]  [place] [address]

## 2010-05-31 NOTE — Op Note (Signed)
NAME:  William [name], William [name] NO.:  [id number]   MEDICAL RECORD NO.:  [id number]          PATIENT TYPE:  OIB   LOCATION:  2550                         FACILITY:  [facility]   PHYSICIAN:  Kinnie Scales. 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   INDICATIONS FOR SURGERY:  Massive left thyroid mass with substernal  extension and tracheal deviation.   SURGICAL PROCEDURE:  excision of left thyroid mass with substernal  dissection.   ANESTHESIA:  General endotracheal with nerve integrity monitor (NIMS).   SURGEON:  Kinnie Scales. Annalee Genta, M.D.   ASSISTANTJeannett Senior Dr. Pollyann Kennedy, M.D.   COMPLICATIONS:  None.   BLOOD LOSS:  Approximately 70 mL.   The patient transferred from the operating room to the recovery in stable  condition.   BRIEF HISTORY:  Mr. Vandyne is a 58-old black male with a history of a  gradually progressive substernal thyroid mass initially diagnosed in 1999  when the patient underwent CT scan prior to coronary artery bypass grafting.  The patient was followed by Dr. Romero Belling [facility] medical service and had  been placed on thyroid replacement therapy.  Despite this treatment, the  patient continued to have a progressively enlarging left thyroid mass  consistent with multinodular goiter with substernal extension.  On  preoperative CT scan, the mass measured 8 x 8 x 10 cm with significant  tracheal and laryngeal deviation without compression.  The patient also has  a history of hypertension, coronary artery disease and obstructive sleep  apnea.  Given his history, examination and findings, I recommended that we  undertake a left thyroidectomy with substernal  dissection.  The risks,  benefits and possible complications of the procedure were discussed in  detail with the patient, understood and concurred with our plan surgery,  which was scheduled as above.   SURGICAL PROCEDURE:  The patient was brought to the operating room at [facility] on May 29, 2004.  The patient was intubated using fiberoptic  laryngoscopy given his sleep apnea and significant anatomic airway  restrictions.  Intubation was achieved without complication or difficulty.  NIMS endotracheal tube was placed.  The Xomed nerve integrity monitoring  system was used throughout the dissection portion of the case in order to  assure preservation of the left recurrent laryngeal nerve.  With the airway  adequately stabilized, the patient was positioned on the operating room,  prepped and draped in a sterile fashion.  He was injected with a total of 8  mL of 1% lidocaine and 1:100,000 solution of epinephrine, which was injected  in a subcutaneous fashion along the proposed skin incision.  The patient was  then prepped and surgery was undertaken.   A __________ neck.  This was made with a#15 scalpel blade, carried through  the skin and  subcutaneous tissue, platysma muscle was identified  bilaterally, divided and elevated.  Subplatysmal flaps were elevated  superiorly and inferiorly using Bovie electrocautery.  The patient's midline  was significantly deviated to the right.  The strap muscles were divided in  the midline and lateralized.  The patient had a massive left thyroid tumor,  and adequate dissection necessitated dividing the left strap muscles, which  were reapproximated at the conclusion of the surgical procedure.  Dissection was then carried out anteriorly along the midline, isolating the  thyroid isthmus superiorly.  The right thyroid lobe appeared normal in size  and configuration and was not explored.  The __________ and suture ligated  with 2-0 chromic suture.   Dissection was then carried laterally along the  trachea in order to free the medial component of the gland.  Dissection was  then carried along the superior aspect of the gland and the superior  laryngeal artery and nerve were identified, divided and suture ligated.  Fibrofatty tissue was divided superiorly and thyroid mass was elevated along  the lateral component.  The middle thyroid vein was identified, divided and  suture ligated, as were several contributing __________ and posterior  dissection, the recurrent laryngeal nerve was identified.  This was  confirmed with the NIMS monitor and the nerve was preserved throughout its  course.  This left the deep substernal component of the gland be addressed.  With traction and finger dissection, the gland was gently elevated from the  superior aspect of the mediastinum and delivered into the neck.  Dissection  was then carried out along the gland, preserving the recurrent laryngeal  nerve and dividing and suture blood vessels entering the inferior thyroid  pedicle.  The mass was then dissected from the trachea, again identifying  and preserving the recurrent laryngeal nerve, and the entire mass was sent  to pathology for gross microscopic evaluation.  The patient's wound was then  reassessed.  There was no active bleeding.  A 10 mm Blake drain was placed  through a separate stab incision at the depth of the wound on the left-hand  side and strap muscles reapproximated, bringing together the superior and  inferior components, which had been divided in order to allow access to the  left lateral neck.  This was closed with a 3-0 Vicryl suture and the midline  strap muscles reapproximated with 3-0 Vicryl suture.  Platysma muscles were  reapproximated bilaterally with 3-0 interrupted Vicryl.  Deep subcutaneous  closure of 4-0 Vicryl, and 5-0 Ethilon was used in a running locked fashion for final skin closure.  The patient was then awakened from his  anesthetic  without complication or difficulty and transferred from the operating   Dictation ended at this point.      DLS/MEDQ  D:  16/10/9602  T:  05/29/2004  Job:  [id number]

## 2010-05-31 NOTE — H&P (Signed)
NAME:  William [name], William [name] NO.:  [id number]   MEDICAL RECORD NO.:  [id number]          PATIENT TYPE:  INP   LOCATION:  1826                         FACILITY:  [facility]   PHYSICIAN:  Sean A. Everardo All, M.D. [facility] OF BIRTH:  25-Dec-1945   DATE OF ADMISSION:  02/08/2004  DATE OF DISCHARGE:                                HISTORY & PHYSICAL   REASON FOR ADMISSION:  Chest pain.   HISTORY OF PRESENT ILLNESS:  The patient is a 65 year old man with several  days of pressure type moderate severity chest pain across the front of the  chest. It is not necessarily worrisome in context of exertion. He has some  associated palpitations.   He ran out of his blood pressure and cholesterol medications and is  currently not taking any medications for either of those medical conditions.   One week of blurry vision.   The patient has had low back pain also for the past few days.   PAST MEDICAL HISTORY:  1.  Hypertension.  2.  Dyslipidemia.  3.  CABG 1999.  4.  Multinodular goiter.   SOCIAL HISTORY:  The patient works as a Pensions consultant at [employer].  There was a gap in his health insurance which he did not take medication or  seek any medical care. He now, with his new job, had gotten back his health  insurance and wants to start receiving health care on a regular basis again.  He has been separated from his wife for 10 years.   FAMILY HISTORY:  No one else at home is ill.   REVIEW OF SYSTEMS:  Chronic slow weight gain and anxiety. He denies the  following:  Fever, headache, syncope, shortness of breath, depression,  decreased urinary stream, incontinence, hematuria, rectal bleeding, nausea,  vomiting, and shortness of breath.   PHYSICAL EXAMINATION:  VITAL SIGNS:  Blood pressure is 162/104, heart rate  90, temperature 98.5, weight is 290.  GENERAL:  Obese, no distress.  SKIN:  Not diaphoretic.  HEENT:  Head is atraumatic. Sclerae nonicteric. There is no periorbital  swelling. No proptosis. Pharynx:  No erythema.  NECK:  Supple. There is a large multinodular goiter present.  CHEST:  Clear to auscultation. No respiratory distress.  CARDIOVASCULAR:  No JVD. No edema. Regular rate and rhythm. No murmur.  Carotid arteries no bruits. Pedal pulses are intact.  ABDOMEN:  Soft, obese, nontender. No hepatosplenomegaly. No mass. No hernia.  The obesity limits the examination.  EXTREMITIES:  No deformity is seen.  NEUROLOGICAL:  Alert, well oriented. Does not appear anxious or depressed  and gait is observed in the office to be normal.   STUDIES:  Electrocardiogram shows minimal inferior ST-T segment changes.   IMPRESSION:  1.  Chest pain in a patient with a history of coronary artery bypass      grafting about 7 years ago.  2.  Blurry vision, uncertain etiology.  3.  Hypertension, not taking his medications.  4.  Dyslipidemia, not taking his medications.  5.  Goiter.  6.  Back pain.   PLAN:  1.  Admit to [facility].  2.  Check CT scan.  3.  Metoprolol.  4.  Aspirin and sublingual nitroglycerin are given here in the office, and      911 is called.  5.  Consult cardiology in the hospital. I have called them.  6.  Resume Lipitor 80 mg a day.  7.  Check TSH.  8.  Check urinalysis.  9.  He will need urine catecholamines at some point to evaluate his      hypertension and palpitations.      SAE/MEDQ  D:  02/08/2004  T:  02/08/2004  Job:  [id number]

## 2010-05-31 NOTE — Assessment & Plan Note (Signed)
[facility]                              CARDIOLOGY OFFICE NOTE   HARTFORD, MAULDEN                          MRN:          [id number]  DATE:11/14/2005                            DOB:          Mar 15, 1945    PRIMARY CARE PHYSICIAN:  Chauncey Reading, D.O.   REASON FOR VISIT:  Followup coronary artery disease.   HISTORY OF PRESENT ILLNESS:  Mr. Schopf returns for a routine visit.  He is  actually doing fairly well, tolerating the medication adjustment made at our  last visit in May.  Electrocardiogram today is normal showing sinus rhythm  at 79 beats per minute.  He does tell me that he was seen by a specialist at  [facility] due to some back/disk problems that are being managed  conservatively.  He has been working very hard on losing weight and has done  an excellent job, decreasing from 292 to 266.  He has been walking regularly  and also watching his diet.  Blood pressure is better controlled today as  well.  We reviewed his medications, and at this point would anticipate a  strategy of risk factor modification.   ALLERGIES:  No known drug allergies.   PRESENT MEDICATIONS:  1. Hydrochlorothiazide 25 mg p.o. daily.  2. Lisinopril 20 mg p.o. daily.  3. Potassium 20 mEq p.o. daily.  4. Zocor 40 mg p.o. daily.  5. Zetia 10 mg p.o. daily.  6. Imdur 60 mg p.o. q.a.m. and 30 mg p.o. q.p.m.  7. Lopressor 50 mg p.o. b.i.d.  8. Aspirin 81 mg p.o. daily.   REVIEW OF SYSTEMS:  As described in History of Present Illness.   PHYSICAL EXAMINATION:  VITAL SIGNS: Blood pressure 118/82, heart rate 79.  Weight 266 pounds.  GENERAL: The patient is comfortable and in no acute distress.  NECK:  Examination reveals no obvious jugular venous pressure, without  bruits.  No thyromegaly is noted.  LUNGS:  Clear without labored breathing at rest.  CARDIAC: Regular rate and rhythm without murmur or gallop.  EXTREMITIES:  No pitting edema.   IMPRESSION AND  RECOMMENDATIONS:  1. Multivessel coronary artery disease with preserved ejection fraction      status post coronary artery bypass grafting in 1999.  Grafts were      patent at angiography in March of this year.  We will plan to continue      medical therapy, and I will see him back for review of the next 6      months.  I encouraged him to continue basic exercise regimen with diet      and congratulated him on his weight loss.  2. Otherwise, continue regular followup with Dr. Landis Martins.  Would anticipate      tight control of his LDL cholesterol down in the 70-80 range.     Jonelle Sidle, MD  Electronically Signed    SGM/MedQ  DD: 11/14/2005  DT: 11/14/2005  Job #: [id number]   cc:   Chauncey Reading, D.O.

## 2010-06-03 ENCOUNTER — Ambulatory Visit (INDEPENDENT_AMBULATORY_CARE_PROVIDER_SITE_OTHER): Payer: Medicare Other | Admitting: Internal Medicine

## 2010-06-03 ENCOUNTER — Encounter: Payer: Self-pay | Admitting: Internal Medicine

## 2010-06-03 VITALS — BP 97/58 | HR 72 | Temp 97.7°F | Ht 70.0 in | Wt 254.7 lb

## 2010-06-03 DIAGNOSIS — I10 Essential (primary) hypertension: Secondary | ICD-10-CM

## 2010-06-03 NOTE — Assessment & Plan Note (Addendum)
Even after stopping hydrochlorothiazide, his blood pressure is low today. Likely due to increased Coreg does.   his blood pressure while lying, sitting and standing doesn't show orthostasis- but is still in 90's/50's. I will decrease the dose of Coreg to 12.5 mg by mouth twice a day which was taking before and was having a blood pressure.  advised him to increase fluid intake and if he feels dizzy while getting out of bed, sit in the bed for 2 minutes and then get up. Don't get up quickly from the bed. He felt understanding.

## 2010-06-03 NOTE — Patient Instructions (Addendum)
Please make a followup appointment in 3-4 weeks Dr. Allena Katz. Please start taking Coreg at dose of 12.5 mg twice a day, instead of 25 mg twice daily as you were taking before last visit. Also while getting out of bed please state in the bed for 2 minutes and then stand up, so that you don't feel dizzy and fell down. Your blood pressure is low today and please continue taking enough amount of water to keep you hydrated.

## 2010-06-03 NOTE — Progress Notes (Signed)
  Subjective:    Patient ID: William [name], male    DOB: 1945-07-13, 65 y.o.   MRN: [id number]  HPI William [name] is a pleasant 65 [place] with past with a history of CAD e status post CABG, hypertension, lipidemia comes in the clinic for a followup visit for his blood pressure. Last visit we increased the dose of Coreg to 25 mg twice a day from 12.5 twice a day, also stop hydrochlorothiazide and potassium chloride. His potassium was 3.7  last visit and I will recheck it again today to make sure is not hypokalemic after being stopped replacement. Of his blood pressure today is 92/57 with pulse rate of 62 so he is beta blocked but he is hypotensive, we'll check orthostatics. He says that he gets dizzy when he bends forward and this is going on for long time is not just new after changing things during last visit. He never fell down or lost consciousness, also did not feel the room spinning around or warmth passing through his body or blurry vision or nausea vomiting. Also denies any chest pain, significant shortness of breath, cough, fever, chills, abdominal pain, urinary abnormalities, increased frequency of urination, polyuria, polydipsia.    Review of Systems    as per history of present illness Objective:   Physical Exam    Constitutional: Vital signs reviewed.  Patient is a well-developed and well-nourished in no acute distress and cooperative with exam. Alert and oriented x3.  Head: Normocephalic and atraumatic Mouth: no erythema or exudates, MMM Eyes: PERRL, EOMI, conjunctivae normal, No scleral icterus.  Neck: Supple, Trachea midline normal ROM, No JVD, mass, thyromegaly, or carotid bruit present.  Cardiovascular: RRR, S1 normal, S2 normal, no MRG, pulses symmetric and intact bilaterally Pulmonary/Chest: CTAB, no wheezes, rales, or rhonchi Abdominal: Soft. Non-tender, non-distended, bowel sounds are normal, no masses, organomegaly, or guarding present.  GU: no CVA  tenderness Musculoskeletal: No joint deformities, erythema, or stiffness, ROM full and no nontender Neurological: A&O x3, Strenght is normal and symmetric bilaterally, cranial nerve II-XII are grossly intact, no focal motor deficit, sensory intact to light touch bilaterally.       Assessment & Plan:

## 2010-06-04 LAB — BASIC METABOLIC PANEL WITH GFR
BUN: 16 mg/dL (ref 6–23)
Calcium: 8.6 mg/dL (ref 8.4–10.5)
GFR, Est African American: >60 mL/min (ref >60)
Glucose, Bld: 87 mg/dL (ref 70–99)

## 2010-06-26 ENCOUNTER — Ambulatory Visit (INDEPENDENT_AMBULATORY_CARE_PROVIDER_SITE_OTHER): Payer: Medicare Other | Admitting: Internal Medicine

## 2010-06-26 ENCOUNTER — Encounter: Payer: Self-pay | Admitting: Internal Medicine

## 2010-06-26 DIAGNOSIS — I10 Essential (primary) hypertension: Secondary | ICD-10-CM

## 2010-06-26 DIAGNOSIS — M25521 Pain in right elbow: Secondary | ICD-10-CM

## 2010-06-26 DIAGNOSIS — M25529 Pain in unspecified elbow: Secondary | ICD-10-CM

## 2010-06-26 DIAGNOSIS — H409 Unspecified glaucoma: Secondary | ICD-10-CM

## 2010-06-26 NOTE — Assessment & Plan Note (Signed)
Likely due to mild tear of the biceps tendon due to overuse. Explained him of the probable diagnosis and advised him to take ibuprofen 600 mg by mouth 3 times a day for one week. He is already taking Prilosec 20 mg daily. If he doesn't get better in next 2 weeks, will reevaluate him for any further diagnostic and management approaches.

## 2010-06-26 NOTE — Assessment & Plan Note (Signed)
Mr. Varelas is that of glaucoma and is followed by his ophthalmologist for this. He is on Timolol 0.5% eye drops twice a day. The increasing pain in his right eye is worrisome for worsening of glaucoma and unfortunately we don't have any tonometer in our office. Called his ophthalmologist office and made an appointment for him for coming Friday, 06/28/2010.

## 2010-06-26 NOTE — Progress Notes (Signed)
  Subjective:    Patient ID: William [name], male    DOB: 10/24/1945, 65 y.o.   MRN: [id number]  HPI William [name] is a pleasant 65 year old man with past with a history of hyponatremia, glaucoma, cataract who comes to the clinic for a followup visit. His blood pressure is 114/65 which is much better than last time. Denies any dizziness, syncope, chest pain, shortness of breath. He does complain of right eye pain, pressure-like, intermittent, does not have a specific time of onset or resolution, which sometimes lasts all day. He does have diagnosis of glaucoma in his right eye and he takes timolol 0.5% eye drops 2 times a day, which helps a little bit but not much in terms of the pain. He has very little vision in his right eye and finger counting from 1 m is abnormal. He denies any symptoms in his left eye.  He also complains of right elbow pain on the flexor aspect at the point of biceps tendon, which started about a week ago when he lifted heavy weight in his right hand. Denies any swelling, redness of elbow. Denies any fever, chills, severe increase pain with movements of right elbow.     Review of Systems As per history of present illness and also   denies any abdominal pain, cough, urinary abnormalities Objective:   Physical Exam    Constitutional: Vital signs reviewed.  Patient in no acute distress and cooperative with exam. Alert and oriented x3.  Head: Normocephalic and atraumatic Mouth: no erythema or exudates, MMM Eyes: PERRL, conjunctivae normal, No scleral icterus.  Neck: Supple, Trachea midline normal ROM, No JVD, mass, thyromegaly, or carotid bruit present.  Cardiovascular: RRR, S1 normal, S2 normal, no MRG, pulses symmetric and intact bilaterally Pulmonary/Chest: CTAB, no wheezes, rales, or rhonchi Abdominal: Soft. Non-tender, non-distended, bowel sounds are normal, no masses, organomegaly, or guarding present.  GU: no CVA tenderness Musculoskeletal: No joint deformities,  erythema, or stiffness, ROM full and no nontender Neurological: A&O x3, Strenght is normal and symmetric bilaterally, cranial nerve II-XII are grossly intact, no focal motor deficit, sensory intact to light touch bilaterally.  Skin: Warm, dry and intact. No rash, cyanosis, or clubbing.  Psychiatric: Normal mood and affect. speech and behavior is normal. Judgment and thought content normal. Cognition and memory are normal.       Assessment & Plan:

## 2010-06-26 NOTE — Assessment & Plan Note (Signed)
His blood pressure is 114/64 today. Much better than last visit. Is on Coreg 12.5 milligram twice a day and lisinopril 40 mg daily. We'll not make any changes for now. I will see him back in 5-6 months.

## 2010-06-26 NOTE — Patient Instructions (Signed)
Please make followup appointment in 5-6 months. If anything comes up meanwhile call the clinic and make an early appointment. Please go to the appointment with ophthalmologist. Please continue taking all the medications regularly. Also take ibuprofen 600 mg 3 times a day for one week for the right elbow pain.

## 2010-10-03 ENCOUNTER — Ambulatory Visit (INDEPENDENT_AMBULATORY_CARE_PROVIDER_SITE_OTHER): Payer: Medicare Other | Admitting: Cardiology

## 2010-10-03 ENCOUNTER — Encounter: Payer: Self-pay | Admitting: Cardiology

## 2010-10-03 VITALS — BP 144/88 | HR 68 | Ht 66.0 in | Wt 258.0 lb

## 2010-10-03 DIAGNOSIS — I10 Essential (primary) hypertension: Secondary | ICD-10-CM

## 2010-10-03 DIAGNOSIS — I209 Angina pectoris, unspecified: Secondary | ICD-10-CM

## 2010-10-03 DIAGNOSIS — E782 Mixed hyperlipidemia: Secondary | ICD-10-CM

## 2010-10-03 DIAGNOSIS — I25810 Atherosclerosis of coronary artery bypass graft(s) without angina pectoris: Secondary | ICD-10-CM

## 2010-10-03 DIAGNOSIS — I739 Peripheral vascular disease, unspecified: Secondary | ICD-10-CM

## 2010-10-03 LAB — DIFFERENTIAL
Basophils Absolute: 0
Basophils Relative: 0
Neutro Abs: 3.5
Neutrophils Relative %: 80 — ABNORMAL HIGH

## 2010-10-03 LAB — I-STAT 8, (EC8 V) (CONVERTED LAB)
BUN: 17
Glucose, Bld: 123 — ABNORMAL HIGH
Hemoglobin: 16
Potassium: 3.8
Sodium: 136
TCO2: 29
pH, Ven: 7.343 — ABNORMAL HIGH

## 2010-10-03 LAB — HEPARIN LEVEL (UNFRACTIONATED): Heparin Unfractionated: 0.49

## 2010-10-03 LAB — CBC
MCHC: 33.6
Platelets: 157
RDW: 14

## 2010-10-03 LAB — POCT CARDIAC MARKERS
CKMB, poc: 1.5
Myoglobin, poc: 111
Operator id: 294521

## 2010-10-03 LAB — TROPONIN I: Troponin I: 0.03

## 2010-10-03 LAB — CK TOTAL AND CKMB (NOT AT ARMC)
CK, MB: 1.7
Relative Index: 0.8

## 2010-10-03 LAB — CARDIAC PANEL(CRET KIN+CKTOT+MB+TROPI)
CK, MB: 1.4
Relative Index: 0.8

## 2010-10-03 LAB — POCT I-STAT CREATININE: Operator id: 294521

## 2010-10-03 NOTE — Patient Instructions (Signed)
Your physician recommends that you schedule a follow-up appointment in: 1 year with Dr. Daleen Squibb  There have been no changes to your medications today.

## 2010-10-03 NOTE — Progress Notes (Signed)
HPI William [name] returns today fotery disease,ion and management of his coronary artery disease, bypass surgery, hypertension, hyperlipidemia.  He denies any angina or use of nitroglycerin. His blood pressures are not optimal today. He does not check his pressures a time. He does add salt to his food.  He denies any orthopnea, PND or edema.  His lab data is followed by primary care at [facility].  EKG shows normal sinus rhythm, normal EKG Past Medical History  Diagnosis Date  . Rotator cuff syndrome   . ED (erectile dysfunction)   . Hyperlipidemia   . Hypertension   . CAD (coronary artery disease)   . Goiter   . Shoulder pain, left   . GERD (gastroesophageal reflux disease)   . Sleep apnea, obstructive   . Arthritis     Past Surgical History  Procedure Date  . Coronary artery bypass graft 1999  . Eye surgery 08/09    Cataract Removal  . Hernia repair     Inguinal Herniorhaphy  . Spine surgery 10/09    C3-4, C4-5 diskectomy    Family History  Problem Relation Age of Onset  . Heart attack Mother     Died at 20 with MI  . Heart attack Father     Died at unknown age with MI  . Heart disease Sister     CABG  . Heart attack Brother     History   Social History  . Marital Status: Legally Separated    Spouse Name: N/A    Number of Children: N/A  . Years of Education: N/A   Occupational History  . Not on file.   Social History Main Topics  . Smoking status: Never Smoker   . Smokeless tobacco: Not on file  . Alcohol Use: No  . Drug Use: No  . Sexually Active: Yes   Other Topics Concern  . Not on file   Social History Narrative  . No narrative on file    No Known Allergies  Current Outpatient Prescriptions  Medication Sig Dispense Refill  . aspirin 81 MG EC tablet Take 81 mg by mouth daily.        . carvedilol (COREG) 12.5 MG tablet Take 12.5 mg by mouth 2 (two) times daily with a meal.        . HYDROcodone-acetaminophen (VICODIN) 5-500 MG per tablet Take 1-2  tablets by mouth at bedtime as needed for Pain. For Shoulder pain  10 tablet  0  . ibuprofen (MOTRIN) 600 MG tablet Take 1 tablet (600 mg total) by mouth every 6 (six) hours as needed for Pain.  60 tablet  2  . lisinopril (PRINIVIL,ZESTRIL) 40 MG tablet Take 1 tablet (40 mg total) by mouth daily.  90 tablet  2  . loratadine (CLARITIN) 10 MG tablet Take 10 mg by mouth daily as needed. For allergies and post nasal drip       . niacin 500 MG tablet Take 500 mg by mouth 2 (two) times daily. Take an 81 mg Aspirin 30 minutes before taking this medication to prevent a possible skin flushing reaction       . nitroGLYCERIN (NITROSTAT) 0.4 MG SL tablet Place 0.4 mg under the tongue every 5 (five) minutes. Up to 3 times as needed for chest pain       . omeprazole (PRILOSEC) 20 MG capsule Take 20 mg by mouth daily.        . pravastatin (PRAVACHOL) 80 MG tablet TAKE  ONE TABLET BY  MOUTH NIGHTLY AT BEDTIME  30 tablet  5  . timolol (TIMOPTIC) 0.5 % ophthalmic solution         ROS Negative other than HPI.   PE General Appearance: well developed, well nourished in no acute distress, obese HEENT: symmetrical face, PERRLA, good dentition  Neck: no JVD, thyromegaly, or adenopathy, trachea midline Chest: symmetric without deformity Cardiac: PMI non-displaced, RRR, normal S1, S2, no gallop or murmur Lung: clear to ausculation and percussion Vascular: all pulses full without bruits  Abdominal: nondistended, nontender, good bowel sounds, no HSM, no bruits Extremities: no cyanosis, clubbing or edema, no sign of DVT, no varicosities  Skin: normal color, no rashes Neuro: alert and oriented x 3, non-focal Pysch: normal affect Filed Vitals:   10/03/10 1150  BP: 144/88  Pulse: 68  Height: 5\' 6"  (1.676 m)  Weight: 258 lb (117.028 kg)    EKG  Labs and Studies Reviewed.   Lab Results  Component Value Date   WBC 4.0 02/12/2010   HGB 13.2 02/12/2010   HCT 38.1* 02/12/2010   MCV 78.1 02/12/2010   PLT 169  02/12/2010      Chemistry      Component Value Date/Time   NA 138 06/03/2010 1214   K 4.1 06/03/2010 1214   CL 103 06/03/2010 1214   CO2 26 06/03/2010 1214   BUN 16 06/03/2010 1214   CREATININE 1.06 06/03/2010 1214   CREATININE 0.97 02/13/2010 0415      Component Value Date/Time   CALCIUM 8.6 06/03/2010 1214   ALKPHOS 57 02/12/2010 1737   AST 18 02/12/2010 1737   ALT 16 02/12/2010 1737   BILITOT 0.4 02/12/2010 1737       Lab Results  Component Value Date   CHOL  Value: 159        ATP III CLASSIFICATION:  <200     mg/dL   Desirable  409-811  mg/dL   Borderline High  >=914    mg/dL   High        07/21/2954   CHOL 164 02/03/2008   CHOL 200 09/02/2007   Lab Results  Component Value Date   HDL 32* 02/13/2010   HDL 38* 02/03/2008   HDL 41 09/02/2007   Lab Results  Component Value Date   LDLCALC  Value: 106        Total Cholesterol/HDL:CHD Risk Coronary Heart Disease Risk Table                     Men   Women  1/2 Average Risk   3.4   3.3  Average Risk       5.0   4.4  2 X Average Risk   9.6   7.1  3 X Average Risk  23.4   11.0        Use the calculated Patient Ratio above and the CHD Risk Table to determine the patient's CHD Risk.        ATP III CLASSIFICATION (LDL):  <100     mg/dL   Optimal  213-086  mg/dL   Near or Above                    Optimal  130-159  mg/dL   Borderline  578-469  mg/dL   High  >629     mg/dL   Very High* 05/15/8411   LDLCALC 105* 02/03/2008   LDLCALC 135* 09/02/2007   Lab Results  Component Value Date   TRIG  105 02/13/2010   TRIG 104 02/03/2008   TRIG 121 09/02/2007   Lab Results  Component Value Date   CHOLHDL 5.0 02/13/2010   CHOLHDL 4.3 Ratio 02/03/2008   CHOLHDL 4.9 Ratio 09/02/2007   Lab Results  Component Value Date   HGBA1C  Value: 6.1 (NOTE)                                                                       According to the ADA Clinical Practice Recommendations for 2011, when HbA1c is used as a screening test:   >=6.5%   Diagnostic of Diabetes Mellitus            (if abnormal result  is confirmed)  5.7-6.4%   Increased risk of developing Diabetes Mellitus  References:Diagnosis and Classification of Diabetes Mellitus,Diabetes Care,2011,34(Suppl 1):S62-S69 and Standards of Medical Care in         Diabetes - 2011,Diabetes Care,2011,34  (Suppl 1):S11-S61.* 02/13/2010   Lab Results  Component Value Date   ALT 16 02/12/2010   AST 18 02/12/2010   ALKPHOS 57 02/12/2010   BILITOT 0.4 02/12/2010   Lab Results  Component Value Date   TSH 2.247 02/12/2010

## 2010-10-03 NOTE — Assessment & Plan Note (Signed)
Borderline. Advised him  to watch his salt which he adds  to his food. Asked him to check outside the office. Goal is less than 140 systolic or less than 90 diastolic

## 2010-10-03 NOTE — Assessment & Plan Note (Signed)
Stable. No change in treatment. 

## 2010-10-04 LAB — CBC
MCHC: 33.9
MCV: 82.5
Platelets: 143 — ABNORMAL LOW
RDW: 14

## 2010-10-04 LAB — CARDIAC PANEL(CRET KIN+CKTOT+MB+TROPI)
CK, MB: 1.3
Total CK: 159
Troponin I: 0.02

## 2010-10-04 LAB — HEPARIN LEVEL (UNFRACTIONATED): Heparin Unfractionated: 0.5

## 2010-10-14 LAB — POCT I-STAT 7, (LYTES, BLD GAS, ICA,H+H)
Acid-base deficit: 2
Bicarbonate: 22.9
Calcium, Ion: 1.13
HCT: 49
Hemoglobin: 16.7
O2 Saturation: 69
Patient temperature: 97.6
Potassium: 4
Potassium: 4.1
Sodium: 138
TCO2: 24
TCO2: 24
pCO2 arterial: 40.4
pH, Arterial: 7.361

## 2010-10-14 LAB — GLUCOSE, CAPILLARY
Glucose-Capillary: 104 — ABNORMAL HIGH
Glucose-Capillary: 105 — ABNORMAL HIGH
Glucose-Capillary: 105 — ABNORMAL HIGH
Glucose-Capillary: 108 — ABNORMAL HIGH
Glucose-Capillary: 111 — ABNORMAL HIGH
Glucose-Capillary: 114 — ABNORMAL HIGH
Glucose-Capillary: 117 — ABNORMAL HIGH
Glucose-Capillary: 117 — ABNORMAL HIGH
Glucose-Capillary: 118 — ABNORMAL HIGH
Glucose-Capillary: 120 — ABNORMAL HIGH
Glucose-Capillary: 123 — ABNORMAL HIGH
Glucose-Capillary: 124 — ABNORMAL HIGH
Glucose-Capillary: 137 — ABNORMAL HIGH
Glucose-Capillary: 169 — ABNORMAL HIGH
Glucose-Capillary: 190 — ABNORMAL HIGH
Glucose-Capillary: 194 — ABNORMAL HIGH

## 2010-10-14 LAB — CULTURE, BAL-QUANTITATIVE W GRAM STAIN: Colony Count: >=100000

## 2010-10-14 LAB — CBC
HCT: 43.7
HCT: 33.5 — ABNORMAL LOW
HCT: 35.8 — ABNORMAL LOW
HCT: 42.8
HCT: 48.8
Hemoglobin: 11.3 — ABNORMAL LOW
Hemoglobin: 14.2
Hemoglobin: 15.3
Hemoglobin: 15.9
MCHC: 32.7
MCHC: 33.1
MCV: 83.3
MCV: 83.7
Platelets: 182
Platelets: 113 — ABNORMAL LOW
Platelets: 169
RBC: 4.01 — ABNORMAL LOW
RBC: 5.83 — ABNORMAL HIGH
RDW: 14.5
RDW: 14.7
RDW: 14.8
RDW: 15.4
WBC: 4.3
WBC: 6.5
WBC: 7.2

## 2010-10-14 LAB — BLOOD GAS, ARTERIAL
Acid-Base Excess: 0.7
Bicarbonate: 22.6
Delivery systems: POSITIVE
FIO2: 0.4
FIO2: 0.4
FIO2: 1
MECHVT: 600
Mode: POSITIVE
O2 Saturation: 76
O2 Saturation: 88.8
O2 Saturation: 96
PEEP: 10
PEEP: 10
Patient temperature: 98.6
Patient temperature: 98.6
RATE: 15
RATE: 15
TCO2: 25.4
TCO2: 25.7
TCO2: 26.6
pCO2 arterial: 37.4
pCO2 arterial: 39.5
pCO2 arterial: 42.2
pCO2 arterial: 46.1 — ABNORMAL HIGH
pH, Arterial: 7.337 — ABNORMAL LOW
pH, Arterial: 7.432
pH, Arterial: 7.438
pO2, Arterial: 44.6 — ABNORMAL LOW
pO2, Arterial: 57.9 — ABNORMAL LOW
pO2, Arterial: 60.6 — ABNORMAL LOW
pO2, Arterial: 93.1

## 2010-10-14 LAB — BASIC METABOLIC PANEL
BUN: 10
BUN: 12
CO2: 22
CO2: 26
Calcium: 7.9 — ABNORMAL LOW
Calcium: 8.2 — ABNORMAL LOW
Chloride: 107
Chloride: 97
Creatinine, Ser: 0.93
GFR calc Af Amer: >60
GFR calc non Af Amer: >60
GFR calc non Af Amer: >60
GFR calc non Af Amer: >60
Glucose, Bld: 186 — ABNORMAL HIGH
Glucose, Bld: 191 — ABNORMAL HIGH
Potassium: 3.4 — ABNORMAL LOW
Potassium: 3.8
Sodium: 134 — ABNORMAL LOW
Sodium: 136
Sodium: 136

## 2010-10-14 LAB — COMPREHENSIVE METABOLIC PANEL
ALT: 19
AST: 18
Albumin: 3.7
Alkaline Phosphatase: 66
Alkaline Phosphatase: 38 — ABNORMAL LOW
BUN: 9
BUN: 16
CO2: 25
Chloride: 103
GFR calc Af Amer: >60
GFR calc non Af Amer: >60
Glucose, Bld: 112 — ABNORMAL HIGH
Potassium: 4.3
Potassium: 3.7
Sodium: 136
Sodium: 135
Total Bilirubin: 1.5 — ABNORMAL HIGH
Total Protein: 7.2

## 2010-10-14 LAB — HEPATIC FUNCTION PANEL
Alkaline Phosphatase: 62
Bilirubin, Direct: 0.2
Indirect Bilirubin: 1 — ABNORMAL HIGH
Total Bilirubin: 1.2

## 2010-10-14 LAB — PHOSPHORUS
Phosphorus: 3.7
Phosphorus: 4.3

## 2010-10-14 LAB — MAGNESIUM
Magnesium: 1.5
Magnesium: 1.7

## 2010-10-22 LAB — URINALYSIS, ROUTINE W REFLEX MICROSCOPIC
Hgb urine dipstick: NEGATIVE
Nitrite: NEGATIVE
Protein, ur: NEGATIVE
Specific Gravity, Urine: 1.021
Urobilinogen, UA: 1

## 2010-10-22 LAB — I-STAT 8, (EC8 V) (CONVERTED LAB)
BUN: 15
Bicarbonate: 26.4 — ABNORMAL HIGH
Chloride: 103
Glucose, Bld: 87
HCT: 47
Hemoglobin: 16
Operator id: 272551
Sodium: 135

## 2010-10-22 LAB — D-DIMER, QUANTITATIVE: D-Dimer, Quant: <0.22

## 2010-10-22 LAB — CBC
MCV: 84.8
Platelets: 182
RDW: 14.7
WBC: 3.9 — ABNORMAL LOW

## 2010-10-22 LAB — B-NATRIURETIC PEPTIDE (CONVERTED LAB): Pro B Natriuretic peptide (BNP): <30

## 2010-10-22 LAB — DIFFERENTIAL
Basophils Absolute: 0
Eosinophils Absolute: 0.1 — ABNORMAL LOW
Lymphocytes Relative: 40
Lymphs Abs: 1.6
Neutrophils Relative %: 44

## 2010-10-22 LAB — POCT CARDIAC MARKERS
Operator id: 272551
Troponin i, poc: <0.05

## 2010-10-24 ENCOUNTER — Ambulatory Visit (INDEPENDENT_AMBULATORY_CARE_PROVIDER_SITE_OTHER): Payer: Medicare Other | Admitting: *Deleted

## 2010-10-24 DIAGNOSIS — Z23 Encounter for immunization: Secondary | ICD-10-CM

## 2011-01-24 ENCOUNTER — Other Ambulatory Visit: Payer: Self-pay | Admitting: Internal Medicine

## 2011-01-27 ENCOUNTER — Telehealth: Payer: Self-pay | Admitting: *Deleted

## 2011-01-27 NOTE — Telephone Encounter (Signed)
Call from pt this am said that he needs something for congestion and coughing. Coughing up white mucous.  Congestion is in his chest.  Coughing a lot.  No fevers.  Has tried [employer] Sinus Congestion and cough.  Has had this since Christmas.  Uses the [facility] on [address].  Angelina Ok, RN 01/27/2011 9:25 AM.

## 2011-01-27 NOTE — Telephone Encounter (Signed)
Noted  

## 2011-03-09 ENCOUNTER — Encounter (HOSPITAL_COMMUNITY)
Admission: EM | Disposition: A | Discharge status: Home / Self Care | Payer: Self-pay | Source: Ambulatory Visit | Attending: Cardiology

## 2011-03-09 ENCOUNTER — Encounter (HOSPITAL_COMMUNITY): Payer: Self-pay | Admitting: Emergency Medicine

## 2011-03-09 ENCOUNTER — Other Ambulatory Visit: Payer: Self-pay

## 2011-03-09 ENCOUNTER — Inpatient Hospital Stay (HOSPITAL_COMMUNITY)
Admission: EM | Admit: 2011-03-09 | Discharge: 2011-03-11 | DRG: 287 | Disposition: A | Discharge status: Home / Self Care | Payer: Medicare Other | Source: Ambulatory Visit | Attending: Cardiology | Admitting: Cardiology

## 2011-03-09 ENCOUNTER — Emergency Department (HOSPITAL_COMMUNITY): Payer: Medicare Other

## 2011-03-09 DIAGNOSIS — D649 Anemia, unspecified: Secondary | ICD-10-CM | POA: Diagnosis present

## 2011-03-09 DIAGNOSIS — Z7982 Long term (current) use of aspirin: Secondary | ICD-10-CM

## 2011-03-09 DIAGNOSIS — I25810 Atherosclerosis of coronary artery bypass graft(s) without angina pectoris: Secondary | ICD-10-CM | POA: Insufficient documentation

## 2011-03-09 DIAGNOSIS — E785 Hyperlipidemia, unspecified: Secondary | ICD-10-CM | POA: Diagnosis present

## 2011-03-09 DIAGNOSIS — R079 Chest pain, unspecified: Secondary | ICD-10-CM

## 2011-03-09 DIAGNOSIS — G4733 Obstructive sleep apnea (adult) (pediatric): Secondary | ICD-10-CM | POA: Diagnosis present

## 2011-03-09 DIAGNOSIS — I2582 Chronic total occlusion of coronary artery: Secondary | ICD-10-CM | POA: Diagnosis present

## 2011-03-09 DIAGNOSIS — I200 Unstable angina: Secondary | ICD-10-CM

## 2011-03-09 DIAGNOSIS — D509 Iron deficiency anemia, unspecified: Secondary | ICD-10-CM

## 2011-03-09 DIAGNOSIS — I10 Essential (primary) hypertension: Secondary | ICD-10-CM | POA: Diagnosis present

## 2011-03-09 DIAGNOSIS — I2510 Atherosclerotic heart disease of native coronary artery without angina pectoris: Secondary | ICD-10-CM | POA: Diagnosis present

## 2011-03-09 DIAGNOSIS — Z8249 Family history of ischemic heart disease and other diseases of the circulatory system: Secondary | ICD-10-CM

## 2011-03-09 DIAGNOSIS — E782 Mixed hyperlipidemia: Secondary | ICD-10-CM | POA: Insufficient documentation

## 2011-03-09 HISTORY — PX: LEFT HEART CATHETERIZATION WITH CORONARY/GRAFT ANGIOGRAM: SHX5450

## 2011-03-09 LAB — CBC
Hemoglobin: 9.8 g/dL — ABNORMAL LOW (ref 13.0–17.0)
MCH: 21.4 pg — ABNORMAL LOW (ref 26.0–34.0)
MCV: 69.3 fL — ABNORMAL LOW (ref 78.0–100.0)
Platelets: 184 10*3/uL (ref 150–400)
RBC: 4.5 MIL/uL (ref 4.22–5.81)
RBC: 4.57 MIL/uL (ref 4.22–5.81)
RDW: 15.7 % — ABNORMAL HIGH (ref 11.5–15.5)
WBC: 3.9 10*3/uL — ABNORMAL LOW (ref 4.0–10.5)

## 2011-03-09 LAB — PROTIME-INR
INR: 1.04 (ref 0.00–1.49)
Prothrombin Time: 13.8 seconds (ref 11.6–15.2)

## 2011-03-09 LAB — CARDIAC PANEL(CRET KIN+CKTOT+MB+TROPI)
Relative Index: INVALID (ref 0.0–2.5)
Total CK: 91 U/L (ref 7–232)
Troponin I: <0.3 ng/mL (ref <0.30)
Troponin I: <0.3 ng/mL (ref <0.30)

## 2011-03-09 LAB — HEPARIN LEVEL (UNFRACTIONATED): Heparin Unfractionated: 0.22 IU/mL — ABNORMAL LOW (ref 0.30–0.70)

## 2011-03-09 LAB — POCT I-STAT TROPONIN I: Troponin i, poc: 0 ng/mL (ref 0.00–0.08)

## 2011-03-09 LAB — BASIC METABOLIC PANEL
CO2: 26 mEq/L (ref 19–32)
Chloride: 104 mEq/L (ref 96–112)
Creatinine, Ser: 0.86 mg/dL (ref 0.50–1.35)
GFR calc Af Amer: >90 mL/min (ref >90)
Sodium: 138 mEq/L (ref 135–145)

## 2011-03-09 LAB — MRSA PCR SCREENING: MRSA by PCR: NEGATIVE

## 2011-03-09 SURGERY — LEFT HEART CATHETERIZATION WITH CORONARY/GRAFT ANGIOGRAM
Laterality: Right

## 2011-03-09 MED ORDER — NITROGLYCERIN 0.2 MG/ML ON CALL CATH LAB
INTRAVENOUS | Status: AC
  Filled 2011-03-09: qty 1

## 2011-03-09 MED ORDER — LISINOPRIL 40 MG PO TABS
40.0000 mg | ORAL_TABLET | Freq: Every day | ORAL | Status: DC
  Administered 2011-03-09 – 2011-03-11 (×3): 40 mg via ORAL
  Filled 2011-03-09 (×4): qty 1

## 2011-03-09 MED ORDER — SODIUM CHLORIDE 0.9 % IV SOLN: INTRAVENOUS | Status: AC

## 2011-03-09 MED ORDER — SODIUM CHLORIDE 0.9 % IV SOLN
250.0000 mL | INTRAVENOUS | Status: DC | PRN
  Administered 2011-03-09: 23:00:00 via INTRAVENOUS

## 2011-03-09 MED ORDER — HEPARIN (PORCINE) IN NACL 2-0.9 UNIT/ML-% IJ SOLN
INTRAMUSCULAR | Status: AC
  Filled 2011-03-09: qty 2000

## 2011-03-09 MED ORDER — DIAZEPAM 5 MG PO TABS: 5.0000 mg | ORAL_TABLET | ORAL | Status: DC

## 2011-03-09 MED ORDER — ASPIRIN 81 MG PO CHEW
324.0000 mg | CHEWABLE_TABLET | ORAL | Status: AC
  Administered 2011-03-09: 324 mg via ORAL
  Filled 2011-03-09: qty 4

## 2011-03-09 MED ORDER — CARVEDILOL 12.5 MG PO TABS
12.5000 mg | ORAL_TABLET | Freq: Two times a day (BID) | ORAL | Status: DC
  Administered 2011-03-09: 12.5 mg via ORAL
  Filled 2011-03-09 (×4): qty 1

## 2011-03-09 MED ORDER — NITROGLYCERIN IN D5W 200-5 MCG/ML-% IV SOLN: 3.0000 ug/min | INTRAVENOUS | Status: DC

## 2011-03-09 MED ORDER — NITROGLYCERIN IN D5W 200-5 MCG/ML-% IV SOLN
10.0000 ug/min | INTRAVENOUS | Status: DC
  Administered 2011-03-09: 10 ug/min via INTRAVENOUS
  Filled 2011-03-09: qty 250

## 2011-03-09 MED ORDER — HEPARIN BOLUS VIA INFUSION
4000.0000 [IU] | Freq: Once | INTRAVENOUS | Status: AC
  Administered 2011-03-09: 4000 [IU] via INTRAVENOUS

## 2011-03-09 MED ORDER — LABETALOL HCL 5 MG/ML IV SOLN
INTRAVENOUS | Status: AC
  Filled 2011-03-09: qty 4

## 2011-03-09 MED ORDER — ONDANSETRON HCL 4 MG/2ML IJ SOLN: 4.0000 mg | Freq: Four times a day (QID) | INTRAMUSCULAR | Status: DC | PRN

## 2011-03-09 MED ORDER — ASPIRIN EC 325 MG PO TBEC
325.0000 mg | DELAYED_RELEASE_TABLET | ORAL | Status: DC
  Filled 2011-03-09 (×2): qty 1

## 2011-03-09 MED ORDER — ONDANSETRON HCL 4 MG/2ML IJ SOLN
4.0000 mg | Freq: Four times a day (QID) | INTRAMUSCULAR | Status: DC | PRN
  Administered 2011-03-09 (×2): 4 mg via INTRAVENOUS
  Filled 2011-03-09 (×2): qty 2

## 2011-03-09 MED ORDER — HYDRALAZINE HCL 20 MG/ML IJ SOLN
INTRAMUSCULAR | Status: AC
  Filled 2011-03-09: qty 1

## 2011-03-09 MED ORDER — TIMOLOL MALEATE 0.5 % OP SOLN
1.0000 [drp] | Freq: Two times a day (BID) | OPHTHALMIC | Status: DC
  Administered 2011-03-09 – 2011-03-11 (×4): 1 [drp] via OPHTHALMIC
  Filled 2011-03-09: qty 5

## 2011-03-09 MED ORDER — HYDRALAZINE HCL 20 MG/ML IJ SOLN
10.0000 mg | Freq: Once | INTRAMUSCULAR | Status: AC
  Administered 2011-03-09: 10 mg via INTRAVENOUS

## 2011-03-09 MED ORDER — LIDOCAINE HCL (PF) 1 % IJ SOLN
INTRAMUSCULAR | Status: AC
  Filled 2011-03-09: qty 30

## 2011-03-09 MED ORDER — HYDRALAZINE HCL 20 MG/ML IJ SOLN
10.0000 mg | Freq: Once | INTRAMUSCULAR | Status: AC
  Administered 2011-03-09: 10 mg via INTRAVENOUS
  Filled 2011-03-09: qty 1

## 2011-03-09 MED ORDER — ACETAMINOPHEN 325 MG PO TABS
650.0000 mg | ORAL_TABLET | ORAL | Status: DC | PRN
  Administered 2011-03-09: 650 mg via ORAL
  Filled 2011-03-09: qty 2

## 2011-03-09 MED ORDER — SODIUM CHLORIDE 0.9 % IJ SOLN
3.0000 mL | Freq: Two times a day (BID) | INTRAMUSCULAR | Status: DC
  Administered 2011-03-09 – 2011-03-10 (×2): 3 mL via INTRAVENOUS

## 2011-03-09 MED ORDER — SODIUM CHLORIDE 0.9 % IV SOLN: 1.0000 mL/kg/h | INTRAVENOUS | Status: DC

## 2011-03-09 MED ORDER — NIACIN 500 MG PO TABS: 500.0000 mg | ORAL_TABLET | Freq: Two times a day (BID) | ORAL | Status: DC

## 2011-03-09 MED ORDER — ACETAMINOPHEN 325 MG PO TABS: 650.0000 mg | ORAL_TABLET | ORAL | Status: DC | PRN

## 2011-03-09 MED ORDER — HEPARIN (PORCINE) IN NACL 100-0.45 UNIT/ML-% IJ SOLN
12.0000 [IU]/kg/h | INTRAMUSCULAR | Status: DC
  Administered 2011-03-09: 12 [IU]/kg/h via INTRAVENOUS
  Filled 2011-03-09 (×2): qty 250

## 2011-03-09 MED ORDER — SODIUM CHLORIDE 0.9 % IJ SOLN: 3.0000 mL | INTRAMUSCULAR | Status: DC | PRN

## 2011-03-09 MED ORDER — HEPARIN SOD (PORCINE) IN D5W 100 UNIT/ML IV SOLN
1300.0000 [IU]/h | INTRAVENOUS | Status: DC
  Administered 2011-03-09: 1300 [IU]/h via INTRAVENOUS
  Filled 2011-03-09: qty 250

## 2011-03-09 MED ORDER — MORPHINE SULFATE 2 MG/ML IJ SOLN
1.0000 mg | INTRAMUSCULAR | Status: DC | PRN
  Administered 2011-03-09: 1 mg via INTRAVENOUS

## 2011-03-09 MED ORDER — HYDRALAZINE HCL 20 MG/ML IJ SOLN: 10.0000 mg | INTRAMUSCULAR | Status: DC

## 2011-03-09 MED ORDER — ASPIRIN EC 81 MG PO TBEC: 81.0000 mg | DELAYED_RELEASE_TABLET | Freq: Every day | ORAL | Status: DC

## 2011-03-09 MED ORDER — NIACIN ER 500 MG PO CPCR
500.0000 mg | ORAL_CAPSULE | Freq: Two times a day (BID) | ORAL | Status: DC
  Administered 2011-03-09 – 2011-03-11 (×4): 500 mg via ORAL
  Filled 2011-03-09 (×7): qty 1

## 2011-03-09 MED ORDER — SIMVASTATIN 40 MG PO TABS
40.0000 mg | ORAL_TABLET | Freq: Every day | ORAL | Status: DC
  Administered 2011-03-09: 40 mg via ORAL
  Filled 2011-03-09 (×2): qty 1

## 2011-03-09 MED ORDER — ASPIRIN 300 MG RE SUPP: 300.0000 mg | RECTAL | Status: AC

## 2011-03-09 MED ORDER — MORPHINE SULFATE 4 MG/ML IJ SOLN
4.0000 mg | Freq: Once | INTRAMUSCULAR | Status: AC
  Administered 2011-03-09: 4 mg via INTRAVENOUS
  Filled 2011-03-09: qty 1

## 2011-03-09 MED ORDER — ATROPINE SULFATE 1 MG/ML IJ SOLN
INTRAMUSCULAR | Status: AC
  Filled 2011-03-09: qty 1

## 2011-03-09 MED ORDER — MORPHINE SULFATE 2 MG/ML IJ SOLN
INTRAMUSCULAR | Status: AC
  Administered 2011-03-09: 1 mg via INTRAVENOUS
  Filled 2011-03-09: qty 1

## 2011-03-09 MED ORDER — PANTOPRAZOLE SODIUM 40 MG PO TBEC
40.0000 mg | DELAYED_RELEASE_TABLET | Freq: Every day | ORAL | Status: DC
  Administered 2011-03-10 – 2011-03-11 (×2): 40 mg via ORAL
  Filled 2011-03-09 (×3): qty 1

## 2011-03-09 MED ORDER — MORPHINE SULFATE 2 MG/ML IJ SOLN
2.0000 mg | INTRAMUSCULAR | Status: DC | PRN
  Administered 2011-03-09: 2 mg via INTRAVENOUS
  Filled 2011-03-09: qty 1

## 2011-03-09 NOTE — H&P (Signed)
    Pt was reexamined and existing H & P reviewed. No changes found.  Runell Gess, MD [facility] 03/09/2011 12:01 PM

## 2011-03-09 NOTE — Progress Notes (Signed)
R femoral sheath pulled. Manual pressure held for 20 minutes.  VSS throughout.  Pt tolerated well.  Groin level 0.  Post sheath education given to pt.  Will continue to monitor.

## 2011-03-09 NOTE — Progress Notes (Signed)
PA Elige Radon notified of BP still 160-170s/80s after 2 doses of 10mg  hydralazine.  Orders received for additional dose of hydralazine.

## 2011-03-09 NOTE — Op Note (Signed)
William [name] is a 66 y.o. male    [id number] LOCATION:  FACILITY: [facility]  PHYSICIAN: Nanetta Batty, M.D. February 20, 1945   DATE OF PROCEDURE:  03/09/2011  DATE OF DISCHARGE:  [facility]  CARDIAC CATHETERIZATION     History obtained from chart review. Mr. Rotert is a pleasant 66 year old gentleman with a history of coronary artery disease requiring revascularization via CABG in 1999 (LIMA to LAD, RIMA to RCA, SVG to OM) who presents with several weeks of progressive chest pain. His past medical history also includes sleep apnea, hypertension, and hyperlipidemia. He has a history of colonic polyps on colonoscopy in 2008 and there is a reference in his records to a history of melena, though he cannot recall when or if he had melena.  He reports developing chest tightness with exertion few weeks ago. It has become progressively worse over the past few days to the point where he has had constant chest pressure for the past two days without any periods of complete relief. Exertion worsens it and it is made somewhat better with rest. He has felt associated nausea, shortness of breath, and diaphoresis. The pain radiates to his left shoulder and arm. He took his medications around 8pm on Saturday and went to bed but was awoken with more discomfort, thus prompting his presentation to the [facility] ED for further evaluation and management. No PND or orthopnea, but he does report progressive dyspnea over the past few weeks.  Because of ongoing chest pain with known CAD I was asked by Dr. Kristeen Miss to bring the patient to the heart catheterization laboratory to define his anatomy and rule out an ischemic etiology.   PROCEDURE DESCRIPTION:    The patient was brought to the second floor  [facility]iac cath lab in the postabsorptive state. He was not  premedicated . His right groin was prepped and shaved in usual sterile fashion. Xylocaine 1% was used  for local  anesthesia. A 6 French sheath was inserted into the right common femoral  artery using standard Seldinger technique . 6 French right and left Judkins diagnostic catheters along with a 6 French pigtail catheter and a "JP 1 and put catheter were used for selective coronary angiography, selective vein graft and internal mammary artery angiography, supravalvular aortography and left ventriculography. Visipaque dye was used for the entirety of the case. Retrograde aortic, left ventricular and pullback pressures were recorded.  HEMODYNAMICS:    AO SYSTOLIC/AO DIASTOLIC: 186/111   LV SYSTOLIC/LV DIASTOLIC: 186/30  ANGIOGRAPHIC RESULTS:   1. Left main; 70% distal tapered  2. LAD; totally occluded at its origin 3. Left circumflex; totally occluded in the its proximal AV groove.  4. Right coronary artery; totally occluded in the midportion 5.LIMA TO LAD; widely patent 6. SVG TO ramus intermedius branch was widely patent     R IMA TO distal right coronary artery revealed a 75% and insertion stenosis with a totally occluded small PDA at the origin and diffuse distal PLA disease    7. Left ventriculography; RAO left ventriculogram was performed using  25 mL of Visipaque dye at 12 mL/second. The overall LVEF estimated  45 % Without wall motion abnormalities  IMPRESSION:Mr. Ekholm has patent grafts with moderate global LV dysfunction and a 75% insertion stenosis of the RIMA graft to the distal right coronary artery. The small PDA was occluded at its origin. The vein graft to the ramus branch and the LIMA to the LAD were widely  patent. I cannot identify a "culprit lesion". I reviewed the angiographic Dr. Elease Hashimoto and we both agree that continued medical therapy is warranted at this time. The patient will be re\re heparinized after sheath removal. He was significantly hypertensive in the Cath Lab and will need to be treated with IV nitroglycerin and hydralazine for sheath removal. She presents acutely in place  and the patient left lateral stable condition.  Runell Gess MD, [facility] 03/09/2011 12:06 PM

## 2011-03-09 NOTE — H&P (Signed)
Primary Cardiologist: Dr. Daleen Squibb [facility])   Patient Location: emergency room, Pod A, Room 10  Chief Complaint: chest pain  HPI: William [name] is a pleasant 66 year old gentleman with a history of coronary artery disease requiring revascularization via CABG in 1999 (LIMA to LAD, RIMA to RCA, SVG to OM) who presents with several weeks of progressive chest pain. His past medical history also includes sleep apnea, hypertension, and hyperlipidemia. He has a history of colonic polyps on colonoscopy in 2008 and there is a reference in his records to a history of melena, though he cannot recall when or if he had melena.   He reports developing chest tightness with exertion few weeks ago. It has become progressively worse over the past few days to the point where he has had constant chest pressure for the past two days without any periods of complete relief. Exertion worsens it and it is made somewhat better with rest. He has felt associated nausea, shortness of breath, and diaphoresis. The pain radiates to his left shoulder and arm. He took his medications around 8pm on Saturday and went to bed but was awoken with more discomfort, thus prompting his presentation to the [facility] ED for further evaluation and management. No PND or orthopnea, but he does report progressive dyspnea over the past few weeks.   He denies ever requiring a blood transfusion. He denies any bright red blood per rectum. He denies frank melena, though he says that his stool has been on the darker side recently. He is not currently taking oral iron.     Past Medical History  CAD  - s/p CABG in 1999 (LIMA to LAD, RIMA to RCA, SVG to OM) - Last LHCath in 2008: patent grafts; LV EF 60% - TTE (2008) with normal LV EF;  Mild left atrial enlargement; mild aortic sclerosis and otherwise no major valvular abnormalities Hypertension  Hyperlipidemia  Chronic left shoulder pain/rotator cuff syndrome Erectile dysfunction Obstructive sleep  apnea Arthritis GERD Melena Colonic polyps on colonoscopy (2008) Peptic ulcer disease S/p inguinal hernia repair S/p cataract removal S/p C3-4, C4-5 diskectomy  Family History:  Extensive history of CAD, premature and otherwise, in siblings and both parents  Social History:  Lives alone Non-smoker; no EtOH or illicits   Review of Systems:  As per HPI; otherwise is comprehensively negative   Allergies: No Known Drug Allergies   Medications (home):  Aspirin 81mg   Carvedilol 12.5mg  BID  Lisinopril 40mg   Pravastatin 80mg   Niacin 500mg  BID Omeprazole 20mg   Timolol 0.5% 1 drop to right eye BID SL NTG PRN   Exam:  Afebrile  HR 63 BP 162/93  RR 14  99% RA  GEN: mild distress due to chest discomfort, no accessory muscle use to assist breathing  HEENT: JVP at 10-12 cm H2O, no carotid bruits  CHEST: clear to auscultation bilaterally; no wheezing, no crackles; midline scar from prior CABG CARDIAC: S1 S2, no murmurs/rubs  ABDOMEN: obese soft, non-tender, no bruits; normal bowel sounds  EXT: warm, well-perfused, no edema  NEURO: alert & oriented x3; no focal deficits detected  SKIN: warm & dry    [facility] Labs:  WBC 6.7  Hgb 9.8 down from 13.2 one year ago (MCV is 69 down from 78 one year ago) Plts 184K  Na 138  K 3.6 Cl 101 HCO3 26 BUN 6  Cr 0.8  Glucose 101  Ca 8.4 INR 1.0  Troponin 0.00 (POC test at 4am)    CXR: no evident effusion,  infiltrate, or edema   ECGs: normal sinus rhythm; borderline low voltage; no ST segment deviation; normal intervals   Assessment/Plan  William [name] is a 66 year old gentleman with a clinical history and presentation concerning for unstable angina. While his ECG and an initial set of cardiac biomarkers appear normal, I find his symptom description to be compelling and concerning. His presentation concerning for ACS is further complicated by the presence of a new microcytic anemia (Hgb 9.8, MCV 69) in a gentleman with a history of colonic  polyps, peptic ulcer disease, and darker stools concerning for melena.   The first priority will be to temporize him medically, with an IV NTG infusion and a Heparin infusion. We will follow is CBC closely and monitor for any signs of bleeding. He may very well end up needing to go to the cath lab for invasive angiography of his bypass grafts. Careful consideration will need to be given to stent-type selection given his new microcytic anemia.  1) Chest pain concerning for unstable angina - IV Heparin and IV NTG infusion with titration as necessary for pain control - ASA 325mg  once, then 81mg  daily - Continue beta-blockade and statin therapy - NPO for now while we work to achieve pain control; may need urgent coronary angiography if intensification of medical management does not achieve pain relief. If pain relief achieved with stable ECG and normal biomarker trend, then a trip to the cath lab can be optimally timed once CBC trend/anemia/potential bleeding issues are better defined. While bare metal stenting in bypass grafts is not ideal, it will need to be strongly considered if he has a significant lesion(s).    2) Microcytic anemia - Concerning for occult bleed, with GI source most likely given his history of peptic ulcer disease, colonic polyps, and recently darker stools.  - Continue PPI therapy for ulcer prophylaxis - Depending on CBC trend and his course on IV Heparin, may ultimately require endoscopic/colonoscopic evaluation.   3) Hypertension - Continue home regimen of beta-blockade and ACE-I - Titrate NTG infusion to pain relief, while maintaining SBP > 100  4) Hyperlipidemia - Continue statin therapy  I discussed my impressions with the patient who voiced understanding. His questions were answered to the best of my ability.   Zacarias Pontes, MD  Cardiology Fellow On-Call  [phone]

## 2011-03-09 NOTE — Progress Notes (Signed)
ANTICOAGULATION CONSULT NOTE - Initial Consult  Pharmacy Consult for Heparin Indication: ACS  No Known Allergies  Patient Measurements: Height: 5\' 6"  (167.6 cm) Weight: 260 lb (117.935 kg) IBW/kg (Calculated) : 63.8  Heparin Dosing Weight: 90   Vital Signs: Temp: 98.5 F (36.9 C) (02/24 0332) Temp src: Oral (02/24 0332) BP: 162/93 mmHg (02/24 0530) Pulse Rate: 63  (02/24 0530)  Labs:  Basename 03/09/11 0340  HGB 9.8*  HCT 31.2*  PLT 184  APTT 34  LABPROT 13.8  INR 1.04  HEPARINUNFRC --  CREATININE 0.86  CKTOTAL --  CKMB --  TROPONINI --   Estimated Creatinine Clearance: 100.7 ml/min (by C-G formula based on Cr of 0.86).  Medical History: Past Medical History  Diagnosis Date  . Rotator cuff syndrome   . ED (erectile dysfunction)   . Hyperlipidemia   . Hypertension   . CAD (coronary artery disease)   . Goiter   . Shoulder pain, left   . GERD (gastroesophageal reflux disease)   . Sleep apnea, obstructive   . Arthritis   H/O CABG x 3  in 1999  Medications:  ASA  Coreg  Zestril  Niacin  Ntg  Prilosec  Pravachol  Timolol  Assessment: 66 yo male with chest pain for Heparin.  Heparin 4000 units IV bolus, 1400 units/hr started in ED at 5 am  Goal of Therapy:  Heparin level 0.3-0.7 units/ml   Plan:  Continue Heparin at current rate Check heparin level in 6 hours.  Shaquita Fort, Gary Fleet 03/09/2011,6:58 AM

## 2011-03-09 NOTE — ED Notes (Signed)
Cath lab called for pt. Pt being transported upstairs, belongings with pt

## 2011-03-09 NOTE — ED Notes (Signed)
Nitro increased due to CP 2/10 and bp 163/97

## 2011-03-09 NOTE — ED Notes (Signed)
Giving pt 324 aspirin per Dr. Elease Hashimoto. Reporting give PO aspirin prior to cath is acceptable

## 2011-03-09 NOTE — Progress Notes (Signed)
PA Elige Radon notified of pt's BP still 170-180/80-100.  Pt also with headache and nausea.  Has received morphine and zofran.  Nitro drip at 30 mcg. Orders received for hydralazine.  Will continue to monitor.

## 2011-03-09 NOTE — ED Notes (Signed)
Patient complaining of left sided chest pain that woke him up in his sleep tonight; describes location as "left sided" and describes pain as "throbbing" and "burning".  Related symptoms include shortness of breath, diaphoresis, and dizziness.  Patient complains of a numbness radiating down left arm.  Cardiac risk factors include hypertension and hypercholesterolemia.

## 2011-03-09 NOTE — Progress Notes (Signed)
Right before attempting to pull sheath, pt started vomiting.  12 lead ekg obtained.  PA Elige Radon notified.  Orders received to titrate Nitro drip to off. Will continue to monitor.

## 2011-03-09 NOTE — Progress Notes (Signed)
    Subjective:   Pt was admitted early this am with CP.  Hx of CAD/ CABG/ Onset of cp - similar to previous angina.  CP worsened this AM around 10 - has increase despite morphine and NTG drip.  Will need to go for cath.      . heparin  4,000 Units Intravenous Once  . morphine  4 mg Intravenous Once      . heparin 12 Units/kg/hr (03/09/11 0500)  . nitroGLYCERIN 10 mcg/min (03/09/11 0457)  . nitroGLYCERIN 25 mcg/min (03/09/11 0950)    Objective:  Vital Signs in the last 24 hours: Blood pressure 170/87, pulse 60, temperature 98.5 F (36.9 C), temperature source Oral, resp. rate 16, height 5\' 6"  (1.676 m), weight 260 lb (117.935 kg), SpO2 99.00%. Temp:  [98.5 F (36.9 C)] 98.5 F (36.9 C) (02/24 0332) Pulse Rate:  [58-72] 60  (02/24 0927) Resp:  [16-20] 16  (02/24 0830) BP: (148-180)/(76-108) 170/87 mmHg (02/24 0950) SpO2:  [96 %-99 %] 99 % (02/24 0830) Weight:  [260 lb (117.935 kg)] 260 lb (117.935 kg) (02/24 0332)  Intake/Output from previous day:   Intake/Output from this shift:    Physical Exam:  Physical Exam: Blood pressure 170/87, pulse 60, temperature 98.5 F (36.9 C), temperature source Oral, resp. rate 16, height 5\' 6"  (1.676 m), weight 260 lb (117.935 kg), SpO2 99.00%. General: Well developed, moderate distress - still having chest pain  Lungs: Clear bilaterally to auscultation without wheezes, rales, or rhonchi. Breathing is unlabored. Heart: Regular rate,  With normal  S1 S2. No murmurs, rubs, or gallops   Abdomen: Soft, non-tender, non-distended with normoactive bowel sounds. No hepatomegaly. No rebound/guarding. No abdominal masses. Msk:  Strength and tone appear normal for age. Extremities: No clubbing or cyanosis. No edema.  Distal pedal pulses are 2+ and equal bilaterally. Neuro: Alert and oriented X 3. Moves all extremities spontaneously. Psych:  Responds to questions appropriately with a normal affect.    Lab Results:   [facility] 03/09/11  0340  NA 138  K 3.6  CL 104  CO2 26  GLUCOSE 101*  BUN 6  CREATININE 0.86  CALCIUM 8.4  MG --  PHOS --   No results found for this basename: AST:2,ALT:2,ALKPHOS:2,BILITOT:2,PROT:2,ALBUMIN:2 in the last 72 hours No results found for this basename: LIPASE:2,AMYLASE:2 in the last 72 hours  Basename 03/09/11 0340  WBC 3.9*  NEUTROABS --  HGB 9.8*  HCT 31.2*  MCV 69.3*  PLT 184   Tele: NSR. No St changes  Assessment/Plan:   1.  Chest pain: enzymes  Are negative from this am .  CP has increased despite medical management.  Will need a cath.  suspicious for occlusion of his SVG to OM ( no significant ECG changes)  Have discussed with Dr. Allyson Sabal.    Disposition: to cath lab.  Another set of cardiac enzymes ordered now.  Vesta Mixer, Montez Hageman., MD, [facility] 03/09/2011, 10:30 AM LOS: Day 0

## 2011-03-09 NOTE — ED Notes (Signed)
Cardiology at bedside.

## 2011-03-09 NOTE — ED Notes (Signed)
Patient with chest pain in left chest.  Patient states that it is sharp in nature.  Mild shortness of breath associated.  No nausea or vomiting or diaphoresis.  Patient does have cardiac history, CABG in 1999.

## 2011-03-09 NOTE — ED Provider Notes (Signed)
History     CSN: [id number]  Arrival date & time 03/09/11  0320   First MD Initiated Contact with Patient 03/09/11 [phone]      Chief Complaint  Patient presents with  . Chest Pain    (Consider location/radiation/quality/duration/timing/severity/associated sxs/prior treatment) HPI Comments: 66 year old male with a history of coronary artery bypass grafting x3, hypertension, high cholesterol who presents with a complaint of chest pain which has been intermittent, left-sided, heavy, associated with shortness of breath and sweating for the last day and a half. It is gradually getting worse, nothing makes it better, seems to get worse with exertion. He denies any fevers, cough, swelling of the legs. His cardiologist is Dr. wall  Patient is a 66 y.o. male presenting with chest pain. The history is provided by the patient and medical records.  Chest Pain     Past Medical History  Diagnosis Date  . Rotator cuff syndrome   . ED (erectile dysfunction)   . Hyperlipidemia   . Hypertension   . CAD (coronary artery disease)   . Goiter   . Shoulder pain, left   . GERD (gastroesophageal reflux disease)   . Sleep apnea, obstructive   . Arthritis     Past Surgical History  Procedure Date  . Coronary artery bypass graft 1999  . Eye surgery 08/09    Cataract Removal  . Hernia repair     Inguinal Herniorhaphy  . Spine surgery 10/09    C3-4, C4-5 diskectomy    Family History  Problem Relation Age of Onset  . Heart attack Mother     Died at 69 with MI  . Heart attack Father     Died at unknown age with MI  . Heart disease Sister     CABG  . Heart attack Brother     History  Substance Use Topics  . Smoking status: Never Smoker   . Smokeless tobacco: Not on file  . Alcohol Use: No      Review of Systems  Cardiovascular: Positive for chest pain.  All other systems reviewed and are negative.    Allergies  Review of patient's allergies indicates no known  allergies.  Home Medications   Current Outpatient Rx  Name Route Sig Dispense Refill  . ASPIRIN 81 MG PO TBEC Oral Take 81 mg by mouth daily.      Marland Kitchen CARVEDILOL 12.5 MG PO TABS Oral Take 12.5 mg by mouth 2 (two) times daily with a meal.      . LISINOPRIL 40 MG PO TABS Oral Take 1 tablet (40 mg total) by mouth daily. 90 tablet 2  . NIACIN 500 MG PO TABS Oral Take 500 mg by mouth 2 (two) times daily. Take an 81 mg Aspirin 30 minutes before taking this medication to prevent a possible skin flushing reaction     . OMEPRAZOLE 20 MG PO CPDR  TAKE ONE CAPSULE BY MOUTH ONE TIME DAILY 30 capsule 5  . PRAVASTATIN SODIUM 80 MG PO TABS  TAKE  ONE TABLET BY MOUTH NIGHTLY AT BEDTIME 30 tablet 5  . TIMOLOL MALEATE 0.5 % OP SOLN Right Eye Place 1 drop into the right eye 2 (two) times daily.     Marland Kitchen NITROGLYCERIN 0.4 MG SL SUBL Sublingual Place 0.4 mg under the tongue every 5 (five) minutes. Up to 3 times as needed for chest pain       BP 154/92  Pulse 71  Temp(Src) 98.5 F (36.9 C) (Oral)  Resp 18  SpO2 97%  Physical Exam  Nursing note and vitals reviewed. Constitutional: He appears well-developed and well-nourished. No distress.  HENT:  Head: Normocephalic and atraumatic.  Mouth/Throat: Oropharynx is clear and moist. No oropharyngeal exudate.  Eyes: Conjunctivae and EOM are normal. Pupils are equal, round, and reactive to light. Right eye exhibits no discharge. Left eye exhibits no discharge. No scleral icterus.  Neck: Normal range of motion. Neck supple. No JVD present. No thyromegaly present.  Cardiovascular: Normal rate, regular rhythm, normal heart sounds and intact distal pulses.  Exam reveals no gallop and no friction rub.   No murmur heard. Pulmonary/Chest: Effort normal and breath sounds normal. No respiratory distress. He has no wheezes. He has no rales. He exhibits no tenderness.  Abdominal: Soft. Bowel sounds are normal. He exhibits no distension and no mass. There is no tenderness.   Musculoskeletal: Normal range of motion. He exhibits no edema and no tenderness.  Lymphadenopathy:    He has no cervical adenopathy.  Neurological: He is alert. Coordination normal.  Skin: Skin is warm and dry. No rash noted. No erythema.  Psychiatric: He has a normal mood and affect. His behavior is normal.    ED Course  Procedures (including critical care time)  ED ECG REPORT   Date: 03/09/2011   Rate: 75  Rhythm: normal sinus rhythm  QRS Axis: normal  Intervals: normal  ST/T Wave abnormalities: normal  Conduction Disutrbances:none  Narrative Interpretation:   Old EKG Reviewed: Compared with EKG from 02/12/2010, T wave inversion in V2 and no longer present, today rate is faster.   Labs Reviewed  CBC - Abnormal; Notable for the following:    WBC 3.9 (*)    Hemoglobin 9.8 (*)    HCT 31.2 (*)    MCV 69.3 (*)    MCH 21.8 (*)    RDW 15.7 (*)    All other components within normal limits  BASIC METABOLIC PANEL - Abnormal; Notable for the following:    Glucose, Bld 101 (*)    GFR calc non Af Amer 88 (*)    All other components within normal limits  POCT I-STAT TROPONIN I   Dg Chest 2 View  03/09/2011  *RADIOLOGY REPORT*  Clinical Data: Chest pain.  CHEST - 2 VIEW  Comparison: 02/12/2010  Findings: Prior CABG.  Borderline heart size.  Lungs are clear.  No effusions or edema.  No acute bony abnormality.  IMPRESSION: No active disease.  Original Report Authenticated By: Cyndie Chime, M.D.     1. Unstable angina       MDM  Rule out acute coronary syndrome, initial troponin is negative, CBC and basic metabolic panel without acute findings, blood pressure slightly elevated but not in need of acute treatment. We'll proceed with nitroglycerin, cardiac consultation.   After consultation with Dr. Maryelizabeth Kaufmann of cardiology, decision made to anticoagulate with heparin, nitroglycerin drip, admit to the cardiology service.  CRITICAL CARE Performed by: Vida Roller   Total  critical care time: 35  Critical care time was exclusive of separately billable procedures and treating other patients.  Critical care was necessary to treat or prevent imminent or life-threatening deterioration.  Critical care was time spent personally by me on the following activities: development of treatment plan with patient and/or surrogate as well as nursing, discussions with consultants, evaluation of patient's response to treatment, examination of patient, obtaining history from patient or surrogate, ordering and performing treatments and interventions, ordering and review of laboratory studies, ordering and review of  radiographic studies, pulse oximetry and re-evaluation of patient's condition.   Vida Roller, MD 03/09/11 [phone]

## 2011-03-09 NOTE — Progress Notes (Signed)
ANTICOAGULATION CONSULT NOTE - Follow Up Consult  Pharmacy Consult for Heparin Indication: chest pain/ACS  No Known Allergies  Patient Measurements: Height: 6\' 4"  (193 cm) Weight: 264 lb 15.9 oz (120.2 kg) IBW/kg (Calculated) : 86.8  Heparin Dosing Weight: 115kg  Vital Signs: Temp: 97.5 F (36.4 C) (02/24 1615) Temp src: Oral (02/24 1615) BP: 141/87 mmHg (02/24 1730) Pulse Rate: 80  (02/24 1730)  Labs:  Basename 03/09/11 1300 03/09/11 1042 03/09/11 0340  HGB 9.8* -- 9.8*  HCT 31.6* -- 31.2*  PLT 203 -- 184  APTT -- -- 34  LABPROT -- -- 13.8  INR -- -- 1.04  HEPARINUNFRC -- 0.22* --  CREATININE -- -- 0.86  CKTOTAL 89 91 --  CKMB 2.3 2.4 --  TROPONINI <0.30 <0.30 --   Estimated Creatinine Clearance: 118.1 ml/min (by C-G formula based on Cr of 0.86).     Assessment: Pt with HX CAD/CABG presented with CP today.  He was taken to cath lab and found to have patent grafts with some stenosis of RIMA but no culprit lesion.  Plan is to medically manage and restart heparin drip 6hr after sheath pulled.  Sheath out at 1700.  Heparin to start at 2300.  Will be less aggressice since post cath for initiation but will increase based on AM level. Goal of Therapy:  Heparin level 0.3-0.7 units/ml   Plan:  At 2300 restart Heparin drip 1300 uts/hr  Check daily heparin level and CBC   Marcelino Scot 03/09/2011,5:34 PM

## 2011-03-09 NOTE — ED Notes (Signed)
Admitting paged regarding 6/10 CP. Reporting obtain EKG and will come down to evaluate patient

## 2011-03-10 DIAGNOSIS — I200 Unstable angina: Secondary | ICD-10-CM

## 2011-03-10 DIAGNOSIS — I517 Cardiomegaly: Secondary | ICD-10-CM

## 2011-03-10 LAB — HEPARIN LEVEL (UNFRACTIONATED): Heparin Unfractionated: <0.1 IU/mL — ABNORMAL LOW (ref 0.30–0.70)

## 2011-03-10 LAB — CBC
HCT: 34.2 % — ABNORMAL LOW (ref 39.0–52.0)
Hemoglobin: 10.8 g/dL — ABNORMAL LOW (ref 13.0–17.0)
MCH: 21.6 pg — ABNORMAL LOW (ref 26.0–34.0)
MCHC: 31.5 g/dL (ref 30.0–36.0)
Platelets: 184 10*3/uL (ref 150–400)
Platelets: 211 10*3/uL (ref 150–400)
RBC: 4.99 MIL/uL (ref 4.22–5.81)
RBC: 4.94 MIL/uL (ref 4.22–5.81)
RDW: 15.7 % — ABNORMAL HIGH (ref 11.5–15.5)
WBC: 4.3 10*3/uL (ref 4.0–10.5)

## 2011-03-10 LAB — CARDIAC PANEL(CRET KIN+CKTOT+MB+TROPI): Troponin I: <0.3 ng/mL (ref <0.30)

## 2011-03-10 MED ORDER — CARVEDILOL 6.25 MG PO TABS
18.7500 mg | ORAL_TABLET | Freq: Two times a day (BID) | ORAL | Status: DC
  Administered 2011-03-10 – 2011-03-11 (×3): 18.75 mg via ORAL
  Filled 2011-03-10 (×6): qty 1

## 2011-03-10 MED ORDER — AMLODIPINE BESYLATE 5 MG PO TABS
5.0000 mg | ORAL_TABLET | Freq: Every day | ORAL | Status: DC
  Administered 2011-03-10 – 2011-03-11 (×2): 5 mg via ORAL
  Filled 2011-03-10 (×2): qty 1

## 2011-03-10 MED ORDER — ATORVASTATIN CALCIUM 40 MG PO TABS
40.0000 mg | ORAL_TABLET | Freq: Every day | ORAL | Status: DC
  Administered 2011-03-10: 40 mg via ORAL
  Filled 2011-03-10 (×2): qty 1

## 2011-03-10 MED ORDER — FUROSEMIDE 20 MG PO TABS
20.0000 mg | ORAL_TABLET | Freq: Every day | ORAL | Status: AC
  Administered 2011-03-10: 20 mg via ORAL
  Filled 2011-03-10: qty 1

## 2011-03-10 MED ORDER — ASPIRIN EC 81 MG PO TBEC
81.0000 mg | DELAYED_RELEASE_TABLET | ORAL | Status: DC
  Administered 2011-03-10 – 2011-03-11 (×2): 81 mg via ORAL
  Filled 2011-03-10 (×4): qty 1

## 2011-03-10 NOTE — Progress Notes (Signed)
UR Completed. Simmons, Harveer Sadler F [phone]  

## 2011-03-10 NOTE — Progress Notes (Signed)
*  PRELIMINARY RESULTS* Echocardiogram 2D Echocardiogram has been performed.  Glean Salen [facility] 03/10/2011, 10:59 AM

## 2011-03-10 NOTE — Progress Notes (Addendum)
Subjective: HPI: William [name] is a pleasant 66 year old gentleman with a history of coronary artery disease requiring revascularization via CABG in 1999 (LIMA to LAD, RIMA to RCA, SVG to OM) who presents with several weeks of progressive chest pain. His past medical history also includes sleep apnea, hypertension, and hyperlipidemia. He has a history of colonic polyps on colonoscopy in 2008 and there is a reference in his records to a history of melena, though he cannot recall when or if he had melena.  He reports developing chest tightness with exertion few weeks ago. It has become progressively worse over the past few days to the point where he has had constant chest pressure for the past two days without any periods of complete relief. Exertion worsens it and it is made somewhat better with rest. He has felt associated nausea, shortness of breath, and diaphoresis. The pain radiates to his left shoulder and arm. He took his medications around 8pm on Saturday and went to bed but was awoken with more discomfort, thus prompting his presentation to the [facility] ED for further evaluation and management. No PND or orthopnea, but he does report progressive dyspnea over the past few weeks.  He denies ever requiring a blood transfusion. He denies any bright red blood per rectum. He denies frank melena, though he says that his stool has been on the darker side recently. He is not currently taking oral iron.    Cardiac cath on 03/09/11 by Dr. Allyson Sabal 1. Left main; 70% distal tapered  2. LAD; totally occluded at its origin  3. Left circumflex; totally occluded in the its proximal AV groove.  4. Right coronary artery; totally occluded in the midportion  5.LIMA TO LAD; widely patent  6. SVG TO ramus intermedius branch was widely patent  R IMA TO distal right coronary artery revealed a 75% and insertion stenosis with a totally occluded small PDA at the origin and diffuse distal PLA disease  7. Left ventriculography;  RAO left ventriculogram was performed using  25 mL of Visipaque dye at 12 mL/second. The overall LVEF estimated  45 % Without wall motion abnormalities   IMPRESSION:William [name] has patent grafts with moderate global LV dysfunction and a 75% insertion stenosis of the RIMA graft to the distal right coronary artery. The small PDA was occluded at its origin. The vein graft to the ramus branch and the LIMA to the LAD were widely patent. I cannot identify a "culprit lesion". I reviewed the angiographic Dr. Elease Hashimoto and we both agree that continued medical therapy is warranted at this time. The patient will be re\re heparinized after sheath removal. He was significantly hypertensive in the Cath Lab and will need to be treated with IV nitroglycerin and hydralazine for sheath removal.  He is doing well this morning, he reports chest pain free since cardiac catheterization.  He denies any SOB, melena or bright red blood per rectum.  Asking when he can go home.    Objective: Vital signs in last 24 hours: Filed Vitals:   03/10/11 0209 03/10/11 0300 03/10/11 0400 03/10/11 0500  BP:  136/60 115/51 150/82  Pulse:  77 70 75  Temp:   98.4 F (36.9 C)   TempSrc:   Oral   Resp: 17 21 17 18   Height:      Weight:      SpO2:  98% 97% 97%   Weight change: 4 lb 15.9 oz (2.265 kg)  Intake/Output Summary (Last 24 hours) at 03/10/11 [id number]  Last data filed at 03/10/11 0500  Gross per 24 hour  Intake 924.07 ml  Output   2350 ml  Net -1425.93 ml   Physical Exam: GEN: NAD HEENT: JVP at 8-10 cm H2O, no carotid bruits  CHEST: clear to auscultation bilaterally; no wheezing, no crackles; midline scar from prior CABG CARDIAC: S1 S2, no murmurs/rubs  ABDOMEN: obese soft, non-tender, no bruits; normal bowel sounds  EXT: warm, well-perfused, no edema  NEURO: alert & oriented x3; no focal deficits detected, moving all 4 extremities  SKIN: warm & dry   Lab Results: Basic Metabolic Panel:  Lab 03/09/11 4098  NA 138    K 3.6  CL 104  CO2 26  GLUCOSE 101*  BUN 6  CREATININE 0.86  CALCIUM 8.4  MG --  PHOS --   CBC:  Lab 03/09/11 1300 03/09/11 0340  WBC 3.4* 3.9*  NEUTROABS -- --  HGB 9.8* 9.8*  HCT 31.6* 31.2*  MCV 69.1* 69.3*  PLT 203 184   Cardiac Enzymes:  Lab 03/10/11 0021 03/09/11 1808 03/09/11 1300  CKTOTAL 94 96 89  CKMB 3.6 2.7 2.3  CKMBINDEX -- -- --  TROPONINI <0.30 <0.30 <0.30   Coagulation:  Lab 03/09/11 0340  LABPROT 13.8  INR 1.04    Micro Results: Recent Results (from the past 240 hour(s))  MRSA PCR SCREENING     Status: Normal   Collection Time   03/09/11 12:41 PM      Component Value Range Status Comment   MRSA by PCR NEGATIVE  NEGATIVE  Final    Studies/Results: Dg Chest 2 View  03/09/2011  *RADIOLOGY REPORT*  Clinical Data: Chest pain.  CHEST - 2 VIEW  Comparison: 02/12/2010  Findings: Prior CABG.  Borderline heart size.  Lungs are clear.  No effusions or edema.  No acute bony abnormality.  IMPRESSION: No active disease.  Original Report Authenticated By: Cyndie Chime, M.D.   Medications:  Scheduled Meds:   . aspirin  324 mg Oral NOW   Or  . aspirin  300 mg Rectal NOW  . aspirin EC  325 mg Oral Q24H  . carvedilol  12.5 mg Oral BID WC  . heparin      . hydrALAZINE      . hydrALAZINE  10 mg Intravenous Once  . hydrALAZINE  10 mg Intravenous Once  . hydrALAZINE  10 mg Intravenous Once  . labetalol      . lidocaine      . lisinopril  40 mg Oral Daily  . niacin  500 mg Oral BID WC  . nitroGLYCERIN      . pantoprazole  40 mg Oral Q1200  . simvastatin  40 mg Oral q1800  . sodium chloride  3 mL Intravenous Q12H  . timolol  1 drop Right Eye BID  . DISCONTD: aspirin EC  81 mg Oral Daily  . DISCONTD: diazepam  5 mg Oral On Call  . DISCONTD: hydrALAZINE  10 mg Intravenous UD  . DISCONTD: niacin  500 mg Oral BID WC   Continuous Infusions:   . sodium chloride 75 mL/hr (03/09/11 1300)  . heparin 1,300 Units/hr (03/09/11 2250)  . DISCONTD: sodium  chloride    . DISCONTD: heparin 12 Units/kg/hr (03/09/11 0500)  . DISCONTD: nitroGLYCERIN Stopped (03/09/11 1122)  . DISCONTD: nitroGLYCERIN Stopped (03/09/11 1602)   PRN Meds:.sodium chloride, acetaminophen, morphine, ondansetron (ZOFRAN) IV, sodium chloride, DISCONTD: acetaminophen, DISCONTD:  morphine injection, DISCONTD: ondansetron (ZOFRAN) IV Assessment/Plan: 1) Chest pain: unstable angina.  CEsx3  Negative.  S/p cardiac cath on 03/09/11 by Dr. Allyson Sabal "patent grafts with moderate global LV dysfunction and a 75% insertion stenosis of the RIMA graft to the distal right coronary artery. The small PDA was occluded at its origin. The vein graft to the ramus branch and the LIMA to the LAD were widely patent. I cannot identify a "culprit lesion". I reviewed the angiographic Dr. Elease Hashimoto and we both agree that continued medical therapy is warranted at this time, EF 45%"  - Continue Heparin gtt, ASA  - Continue beta-blockade, ACEi and statin therapy (Coreg 12.5mg , Lisinopril 40mg  & Simvastatin 40mg ) -Add norvasc 5mg  -Get 2D echo  2) Microcytic anemia-Baseline Hb 13. Hb on admission was 9.8 - Concerning for occult bleed, with GI source most likely given his history of peptic ulcer disease, colonic polyps, and recently darker stools.  - Continue PPI therapy for ulcer prophylaxis  -Hb this AM is pending -FOBT-pending -Monitor CBC q12hr -May need to consult GI if continues to trend down -Transfuse if <8  3) Hypertension- not well controlled. SBP 150/82 - Continue home regimen of beta-blockade and ACE-I  -consider long acting nitrate for BP control, afterload reduction  4) Hyperlipidemia -lipid panel 02/2011: total chol 159, Trig 105, HDL 32, LDL 106 - Continue statin therapy  Dispo: Telemetry later today   LOS: 1 day   HO,MICHELE 03/10/2011, 6:59 AM  Patient seen with resident, agree with note.  No further chest pain.  Cardiac enzymes negative.  BP still running high.  JVP elevated, suspect  some volume overload as well.  - Stop heparin gtt - Add amlodipine 5 mg daily - Increase Coreg to 18.75 mg bid - Echo - Lasix 20 mg po daily. - Transfer to telemetry, possible d/c in am.  Will need to change statin to atorvastatin 40 at discharge.   Marca Ancona 03/10/2011 7:45 AM

## 2011-03-11 ENCOUNTER — Other Ambulatory Visit: Payer: Self-pay

## 2011-03-11 DIAGNOSIS — I200 Unstable angina: Secondary | ICD-10-CM

## 2011-03-11 DIAGNOSIS — D509 Iron deficiency anemia, unspecified: Secondary | ICD-10-CM

## 2011-03-11 LAB — CBC
HCT: 37 % — ABNORMAL LOW (ref 39.0–52.0)
Hemoglobin: 11.6 g/dL — ABNORMAL LOW (ref 13.0–17.0)
RBC: 5.37 MIL/uL (ref 4.22–5.81)

## 2011-03-11 LAB — POCT ACTIVATED CLOTTING TIME: Activated Clotting Time: 149 seconds

## 2011-03-11 MED ORDER — ISOSORBIDE MONONITRATE 15 MG HALF TABLET: 15.0000 mg | ORAL_TABLET | Freq: Every day | ORAL | Status: DC

## 2011-03-11 MED ORDER — AMLODIPINE BESYLATE 5 MG PO TABS: 5.0000 mg | ORAL_TABLET | Freq: Every day | ORAL | Status: DC

## 2011-03-11 MED ORDER — ISOSORBIDE MONONITRATE ER 30 MG PO TB24: 15.0000 mg | ORAL_TABLET | Freq: Every day | ORAL | Status: DC

## 2011-03-11 MED FILL — Perflutren Lipid Microsphere IV Susp 6.52 MG/ML: INTRAVENOUS | Qty: 2 | Status: AC

## 2011-03-11 NOTE — Progress Notes (Signed)
Patient ID: William [name], male   DOB: 04/10/1945, 66 y.o.   MRN: [id number]  Subjective: HPI: William [name] is a pleasant 66 year old gentleman with a history of coronary artery disease requiring revascularization via CABG in 1999 (LIMA to LAD, RIMA to RCA, SVG to OM) who presents with several weeks of progressive chest pain. His past medical history also includes sleep apnea, hypertension, and hyperlipidemia. He has a history of colonic polyps on colonoscopy in 2008 and there is a reference in his records to a history of melena, though he cannot recall when or if he had melena.  He reports developing chest tightness with exertion few weeks ago. It has become progressively worse over the past few days to the point where he has had constant chest pressure for the past two days without any periods of complete relief. Exertion worsens it and it is made somewhat better with rest. He has felt associated nausea, shortness of breath, and diaphoresis. The pain radiates to his left shoulder and arm. He took his medications around 8pm on Saturday and went to bed but was awoken with more discomfort, thus prompting his presentation to the [facility] ED for further evaluation and management. No PND or orthopnea, but he does report progressive dyspnea over the past few weeks.  He denies ever requiring a blood transfusion. He denies any bright red blood per rectum. He denies frank melena, though he says that his stool has been on the darker side recently. He is not currently taking oral iron.    Cardiac cath on 03/09/11 by Dr. Allyson Sabal 1. Left main; 70% distal tapered  2. LAD; totally occluded at its origin  3. Left circumflex; totally occluded in the its proximal AV groove.  4. Right coronary artery; totally occluded in the midportion  5.LIMA TO LAD; widely patent  6. SVG TO ramus intermedius branch was widely patent  R IMA TO distal right coronary artery revealed a 75% and insertion stenosis with a totally occluded small  PDA at the origin and diffuse distal PLA disease  7. Left ventriculography; RAO left ventriculogram was performed using  25 mL of Visipaque dye at 12 mL/second. The overall LVEF estimated  45 % Without wall motion abnormalities   IMPRESSION:Mr. Prestage has patent grafts with moderate global LV dysfunction and a 75% insertion stenosis of the RIMA graft to the distal right coronary artery. The small PDA was occluded at its origin. The vein graft to the ramus branch and the LIMA to the LAD were widely patent. I cannot identify a "culprit lesion". I reviewed the angiographic Dr. Elease Hashimoto and we both agree that continued medical therapy is warranted at this time. The patient will be re\re heparinized after sheath removal. He was significantly hypertensive in the Cath Lab and will need to be treated with IV nitroglycerin and hydralazine for sheath removal.  He is doing well this morning, he reports chest pain free since cardiac catheterization.  He denies any SOB, melena or bright red blood per rectum.  Asking when he can go home.    Objective: Vital signs in last 24 hours: Filed Vitals:   03/10/11 1613 03/10/11 1729 03/10/11 2018 03/11/11 0444  BP: 153/88 168/72 115/76 156/84  Pulse: 69 73 69 72  Temp: 97.3 F (36.3 C)  98.2 F (36.8 C) 98.6 F (37 C)  TempSrc: Oral  Oral Oral  Resp: 20  20 20   Height:      Weight:      SpO2:  99%  98% 99%   Weight change:   Intake/Output Summary (Last 24 hours) at 03/11/11 0810 Last data filed at 03/10/11 1600  Gross per 24 hour  Intake    300 ml  Output    525 ml  Net   -225 ml   Physical Exam: GEN: NAD HEENT: JVP at 8-10 cm H2O, no carotid bruits  CHEST: clear to auscultation bilaterally; no wheezing, no crackles; midline scar from prior CABG CARDIAC: S1 S2, no murmurs/rubs  ABDOMEN: obese soft, non-tender, no bruits; normal bowel sounds  EXT: warm, well-perfused, no edema  NEURO: alert & oriented x3; no focal deficits detected, moving all 4  extremities  SKIN: warm & dry   Lab Results: Basic Metabolic Panel:  Lab 03/09/11 0865  NA 138  K 3.6  CL 104  CO2 26  GLUCOSE 101*  BUN 6  CREATININE 0.86  CALCIUM 8.4  MG --  PHOS --   CBC:  Lab 03/10/11 1730 03/10/11 0912  WBC 4.3 4.3  NEUTROABS -- --  HGB 10.6* 10.8*  HCT 34.2* 34.3*  MCV 69.2* 68.7*  PLT 211 184   Cardiac Enzymes:  Lab 03/10/11 0021 03/09/11 1808 03/09/11 1300  CKTOTAL 94 96 89  CKMB 3.6 2.7 2.3  CKMBINDEX -- -- --  TROPONINI <0.30 <0.30 <0.30   Coagulation:  Lab 03/09/11 0340  LABPROT 13.8  INR 1.04    Micro Results: Recent Results (from the past 240 hour(s))  MRSA PCR SCREENING     Status: Normal   Collection Time   03/09/11 12:41 PM      Component Value Range Status Comment   MRSA by PCR NEGATIVE  NEGATIVE  Final    Studies/Results: No results found. Medications:  Scheduled Meds:    . amLODipine  5 mg Oral Daily  . aspirin EC  81 mg Oral Q24H  . atorvastatin  40 mg Oral q1800  . carvedilol  18.75 mg Oral BID WC  . furosemide  20 mg Oral Daily  . lisinopril  40 mg Oral Daily  . niacin  500 mg Oral BID WC  . pantoprazole  40 mg Oral Q1200  . timolol  1 drop Right Eye BID  . DISCONTD: sodium chloride  3 mL Intravenous Q12H   Continuous Infusions:  PRN Meds:.acetaminophen, morphine, ondansetron (ZOFRAN) IV, DISCONTD: sodium chloride, DISCONTD: sodium chloride Assessment/Plan: 1) Chest pain: unstable angina. CEsx3  Negative.  S/p cardiac cath on 03/09/11 by Dr. Allyson Sabal "patent grafts with moderate global LV dysfunction and a 75% insertion stenosis of the RIMA graft to the distal right coronary artery. The small PDA was occluded at its origin. The vein graft to the ramus branch and the LIMA to the LAD were widely patent. I cannot identify a "culprit lesion".  Medical Rx - Continue Heparin gtt, ASA  - Continue beta-blockade, ACEi and statin therapy (Coreg 12.5mg , Lisinopril 40mg  & Simvastatin 40mg ) -Add norvasc 5mg  Echo with EF  60-65%  2) Microcytic anemia- Stable   - Continue PPI therapy for ulcer prophylaxis  Out patient F/U [facility]   3) Hypertension- not well controlled. SBP 150/82 - Continue home regimen of beta-blockade and ACE-I  -consider long acting nitrate for BP control, afterload reduction  4) Hyperlipidemia -lipid panel 02/2011: total chol 159, Trig 105, HDL 32, LDL 106 - Continue statin therapy  Dispo: Home today   LOS: 2 days   Charlton Haws 03/11/2011, 8:10 AM

## 2011-03-11 NOTE — Progress Notes (Signed)
1220 - pt d/c home with instructions, r/x, and f/u appointments. Pt verbalized understanding of instructions, pt home alone, escorted out by nursing student William [name].  William [name] 03/11/2011

## 2011-03-11 NOTE — Discharge Summary (Signed)
Discharge Summary   Patient ID: REASE WENCE MRN: [id number], DOB/AGE: [birth year] 66 y.o.  Primary MD: Lyn Hollingshead, MD Primary Cardiologist: Valera Castle MD  Admit date: 03/09/2011 D/C date:     03/11/2011     Primary Discharge Diagnoses:  1. Unstable Angina  - Cardiac Enzymes negative & EKG w/o acute ischemic changes  - Cardiac cath 03/09/11 revealed patent grafts w/ mod global LV dysfunction (EF 45%), small PDA occluded at its origin, & a 75% insertion stenosis of the RIMA graft to the distal RCA; Couldn't identify culprit lesion  - Recommend continued medical therapy with addition of Imdur 15mg    - Echo 03/10/11 revealed mild LVH, EF 60-65%, no WMA 2. Hypertension   - BP not well controlled  - Initiated on Norvasc 5mg  and Imdur 15mg  3. Microcytic anemia  - Baseline Hgb 13, 9.8 on admission, improved to 11.6 without intervention, no active signs of bleeding  - For outpatient f/u w/ PCP  Secondary Discharge Diagnoses:  1. CAD   - s/p CABG in 1999 (LIMA to LAD, RIMA to RCA, SVG to OM)   - TTE (2008) with normal LV EF; Mild left atrial enlargement; mild aortic sclerosis and otherwise no major valvular abnls  - Cardiac cath 03/09/11 as above  2. Hyperlipidemia - 02/2011: total chol 159, Trig 105, HDL 32, LDL 106 3. Obstructive sleep apnea  4. Chronic left shoulder pain/rotator cuff syndrome  5. Erectile dysfunction  6. Arthritis  7. GERD  8. Melena  9. Colonic polyps on colonoscopy (2008)  10. Peptic ulcer disease  11. S/p inguinal hernia repair  12. S/p cataract removal  13. S/p C3-4, C4-5 diskectomy  Allergies No Known Allergies  Diagnostic Studies/Procedures:  03/09/11 - Left Heart Catheterization Hemodynamics:  AO SYSTOLIC/AO DIASTOLIC: 186/111  LV SYSTOLIC/LV DIASTOLIC: 186/30  Angiographic Results:  1. Left main; 70% distal tapered  2. LAD; totally occluded at its origin  3. Left circumflex; totally occluded in the its proximal AV groove.  4. Right coronary  artery; totally occluded in the midportion  5. LIMA TO LAD; widely patent  6. SVG TO ramus intermedius branch was widely patent        R IMA TO distal right coronary artery revealed a 75% and insertion stenosis with a totally occluded small PDA at the origin       and diffuse distal PLA disease  7. Left ventriculography; RAO left ventriculogram was performed using 25 mL of Visipaque dye at 12 mL/second. The overall LVEF estimated 45 % Without wall motion abnormalities  IMPRESSION:Mr. Klabunde has patent grafts with moderate global LV dysfunction and a 75% insertion stenosis of the RIMA graft to the distal right coronary artery. The small PDA was occluded at its origin. The vein graft to the ramus branch and the LIMA to the LAD were widely patent. I cannot identify a "culprit lesion". I reviewed the angiographic Dr. Elease Hashimoto and we both agree that continued medical therapy is warranted at this time.   03/10/11 - 2D Echocardiogram Study Conclusions:   - Left ventricle: The cavity size was normal. Wall thickness was increased in a pattern of mild LVH. Systolic function was normal. The estimated ejection fraction was in the range of 60% to 65%. Wall motion was normal; there were no regional wall motion abnormalities.  History of Present Illness: 66 y.o. male w/ the above problems who presented to [facility] on [facility]/09/11 with complaints of several weeks of progressive chest pain.  He reported developing  chest tightness with exertion a few weeks ago that had become progressively worse over the past few days to the point where he had constant chest pressure for the two days prior to presentation without any periods of complete relief prompting him to present to the ED. He also reported his stools had been on the darker side recently, but denied frank melena or BRBPR.  Hospital Course: In the ED, EKG showed NSR w/ borderline low voltage, no ST segment deviation, CXR was without acute cardiopulmonary  findings, and negative poc troponin. Labs were significant for Hgb 9.8, mCV 69. His symptoms were concerning for unstable angina for which he was initiated on NTG & heparin drips and admitted for further evaluation and treatment with close monitoring for any signs of bleeding.  Cardiac enzymes remained negative and there were no ST changes on tele, but his chest pain worsened despite NTG. He underwent cardiac catheterization on 03/09/11 revealing patent grafts w/ mod global LV dysfunction (EF 45%), the small PDA was occluded at its origin, & there was a 75% insertion stenosis of the RIMA graft to the distal RCA, but they could not identify a "culprit lesion". Continued medical therapy was recommended. He tolerated the procedure well without complications. He had hypertension during and post cath with SBPs 170s-180s for which he received IV hydralazine and uptitration of his NTG drip with some improvement in his BP. The NTG drip was eventually dc'd and he was placed on Norvasc 5mg  and Imdur 15mg .  Post cath he remained chest pain free. 2D Echocardiogram completed on 03/10/11 revealed mild LVH, EF 60-65%, and no WMA.  His baseline Hgb is 13, but was 9.8 on admission with concern for occult bleed with GI source most likely given his history of peptic ulcer disease, colonic polyps, and recently darker stools. His PPI therapy was continued and CBC monitored. Hgb improved to 11.6  without intervention and there were no signs of active bleeding. He was instructed to follow up with his PCP for further evaluation.  He was seen and evaluated by Dr. Eden Emms who felt he was stable for discharge home with plans for follow up as scheduled below.  Discharge Vitals: Blood pressure 145/83, pulse 85, temperature 98.6 F (37 C), temperature source Oral, resp. rate 20, height 6\' 4"  (1.93 m), weight 264 lb 15.9 oz (120.2 kg), SpO2 99.00%.  Labs: Component Value Date   WBC 5.3 03/11/2011   HGB 11.6* 03/11/2011   HCT 37.0*  03/11/2011   MCV 68.9* 03/11/2011   PLT 233 03/11/2011    Lab 03/09/11 0340  NA 138  K 3.6  CL 104  CO2 26  BUN 6  CREATININE 0.86  CALCIUM 8.4  GLUCOSE 101*   Basename 03/10/11 0021 03/09/11 1808 03/09/11 1300 03/09/11 1042  CKTOTAL 94 96 89 91  CKMB 3.6 2.7 2.3 2.4  TROPONINI <0.30 <0.30 <0.30 <0.30      Discharge Medications   Medication List  As of 03/11/2011 11:14 AM   TAKE these medications         amLODipine 5 MG tablet   Commonly known as: NORVASC   Take 1 tablet (5 mg total) by mouth daily.      aspirin 81 MG EC tablet   Take 81 mg by mouth daily.      carvedilol 12.5 MG tablet   Commonly known as: COREG   Take 12.5 mg by mouth 2 (two) times daily with a meal.      isosorbide mononitrate 30  MG 24 hr tablet   Commonly known as: IMDUR   Take 0.5 tablets (15 mg total) by mouth daily.      lisinopril 40 MG tablet   Commonly known as: PRINIVIL,ZESTRIL   Take 1 tablet (40 mg total) by mouth daily.      niacin 500 MG tablet   Take 500 mg by mouth 2 (two) times daily. Take an 81 mg Aspirin 30 minutes before taking this medication to prevent a possible skin flushing reaction      nitroGLYCERIN 0.4 MG SL tablet   Commonly known as: NITROSTAT   Place 0.4 mg under the tongue every 5 (five) minutes. Up to 3 times as needed for chest pain      omeprazole 20 MG capsule   Commonly known as: PRILOSEC   TAKE ONE CAPSULE BY MOUTH ONE TIME DAILY      pravastatin 80 MG tablet   Commonly known as: PRAVACHOL   TAKE  ONE TABLET BY MOUTH NIGHTLY AT BEDTIME      timolol 0.5 % ophthalmic solution   Commonly known as: TIMOPTIC   Place 1 drop into the right eye 2 (two) times daily.            Disposition   Discharge Orders    Future Appointments: Provider: Department: Dept Phone: Center:   04/02/2011 10:10 AM Beatrice Lecher, PA [facility] [phone] [facility]     Future Orders Please Complete By Expires   Diet - low sodium heart healthy      Increase  activity slowly      Discharge instructions      Comments:   **PLEASE REMEMBER TO BRING ALL OF YOUR MEDICATIONS TO EACH OF YOUR FOLLOW-UP OFFICE VISITS.  **NO DRIVING X 2-3 DAYS.** * NO HEAVY LIFTING (>10lbs) OR SEXUAL ACTIVITY X 7 DAYS. * NO SOAKING BATHS, HOT TUBS, POOLS, ETC., X 7 DAYS. * KEEP GROIN SITE CLEAN AND DRY. Call the office for any signs of bleedings, pus, swelling, increased pain, or any other concerns.  * Please follow up with your primary care provider regarding your dark stools and low blood counts.     Follow-up Information    Follow up with PATEL,RAVI, MD. Schedule an appointment as soon as possible for a visit in 2 weeks.      Follow up with Tereso Newcomer, PA on 04/02/2011. (10:10)    Contact information:   [facility] [address] [place] [address] [phone]           Outstanding Labs/Studies: None  Duration of Discharge Encounter: Greater than 30 minutes including physician and PA time.  Signed, Nam Vossler PA-C 03/11/2011, 11:14 AM

## 2011-04-02 ENCOUNTER — Ambulatory Visit (INDEPENDENT_AMBULATORY_CARE_PROVIDER_SITE_OTHER): Payer: Medicare Other | Admitting: Physician Assistant

## 2011-04-02 ENCOUNTER — Ambulatory Visit (INDEPENDENT_AMBULATORY_CARE_PROVIDER_SITE_OTHER): Payer: Medicare Other | Admitting: Internal Medicine

## 2011-04-02 ENCOUNTER — Encounter: Payer: Self-pay | Admitting: Internal Medicine

## 2011-04-02 ENCOUNTER — Encounter: Payer: Self-pay | Admitting: Physician Assistant

## 2011-04-02 VITALS — BP 133/72 | HR 74 | Ht 71.0 in | Wt 266.0 lb

## 2011-04-02 VITALS — BP 143/81 | HR 74 | Temp 97.8°F | Ht 71.0 in | Wt 267.3 lb

## 2011-04-02 DIAGNOSIS — I25810 Atherosclerosis of coronary artery bypass graft(s) without angina pectoris: Secondary | ICD-10-CM

## 2011-04-02 DIAGNOSIS — E782 Mixed hyperlipidemia: Secondary | ICD-10-CM

## 2011-04-02 DIAGNOSIS — R0602 Shortness of breath: Secondary | ICD-10-CM

## 2011-04-02 DIAGNOSIS — D649 Anemia, unspecified: Secondary | ICD-10-CM

## 2011-04-02 DIAGNOSIS — K279 Peptic ulcer, site unspecified, unspecified as acute or chronic, without hemorrhage or perforation: Secondary | ICD-10-CM

## 2011-04-02 DIAGNOSIS — I10 Essential (primary) hypertension: Secondary | ICD-10-CM

## 2011-04-02 DIAGNOSIS — I739 Peripheral vascular disease, unspecified: Secondary | ICD-10-CM

## 2011-04-02 DIAGNOSIS — Z79899 Other long term (current) drug therapy: Secondary | ICD-10-CM

## 2011-04-02 LAB — CBC WITH DIFFERENTIAL/PLATELET
Basophils Relative: 1 % (ref 0.0–3.0)
Eosinophils Absolute: 0.1 10*3/uL (ref 0.0–0.7)
Eosinophils Relative: 2.3 % (ref 0.0–5.0)
Lymphocytes Relative: 38.4 % (ref 12.0–46.0)
MCHC: 31.5 g/dL (ref 30.0–36.0)
Neutrophils Relative %: 49.1 % (ref 43.0–77.0)
RBC: 4.37 Mil/uL (ref 4.22–5.81)
WBC: 3.8 10*3/uL — ABNORMAL LOW (ref 4.5–10.5)

## 2011-04-02 LAB — BASIC METABOLIC PANEL
Calcium: 8.6 mg/dL (ref 8.4–10.5)
Creatinine, Ser: 0.9 mg/dL (ref 0.4–1.5)

## 2011-04-02 LAB — FOLATE: Folate: 13.2 ng/mL (ref >5.9)

## 2011-04-02 LAB — IBC PANEL: Saturation Ratios: 6.5 % — ABNORMAL LOW (ref 20.0–50.0)

## 2011-04-02 LAB — FERRITIN: Ferritin: 5.7 ng/mL — ABNORMAL LOW (ref 22.0–322.0)

## 2011-04-02 NOTE — Assessment & Plan Note (Signed)
No lower extremity vascular studies in past.  Claudication with unclear etiology- likely from lumbar spinal stenosis versus PVD. Is keeps on having this after his evaluation of anemia, we'll consider vascular surgery referral.

## 2011-04-02 NOTE — Assessment & Plan Note (Signed)
Lab Results  Component Value Date   NA 137 04/02/2011   K 4.2 04/02/2011   CL 104 04/02/2011   CO2 26 04/02/2011   BUN 13 04/02/2011   CREATININE 0.9 04/02/2011   CREATININE 1.06 06/03/2010    BP Readings from Last 3 Encounters:  04/02/11 143/81  04/02/11 133/72  03/11/11 145/83    Assessment: Hypertension control:  mildly elevated  Progress toward goals:  deteriorated Barriers to meeting goals:  no barriers identified  Plan: Hypertension treatment:  continue current medications

## 2011-04-02 NOTE — Progress Notes (Signed)
[address] [place], [place]  [address] Phone: [phone] Fax:  [phone]  Date:  04/02/2011   Name:  William [name]       DOB:  10-23-1945 MRN:  [id number]  PCP:  Dr. Allena Katz Primary Cardiologist:  Dr. Valera Castle  Primary Electrophysiologist:  None    History of Present Illness: William [name] is a 66 y.o. male who presents for post hospital follow up.  He has a history of CAD, status post CABG in 1999, preserved LV function, hypertension, hyperlipidemia, sleep apnea, GERD.  He was admitted 2/24-2/26 with chest pain.  MI was ruled out.  Patient also noted darker stools.  Hemoglobin was 9.8 and MCV was 69.  LHC 03/09/11: Distal left main 70%, ostial LAD occluded, proximal AV groove circumflex occluded, mid RCA occluded, LIMA-LAD patent, SVG-ramus intermedius patent, RIMA-distal RCA 75% at insertion and totally occluded small PDA and diffuse distal PLA disease, EF 45%.  No culprit was identified for his pain and continued medical therapy was recommended.  Followup echo 03/10/11: Mild LVH, EF 60-65%.  Hemoglobin was 11.6 at discharge.  Other labs: Potassium 3.6, creatinine 0.6.  Chest x-ray was unremarkable.  Patient was advised to follow up with his PCP for his anemia.  He denies further chest pain.  He does note fatigue and dyspnea with more extreme activities.  He is probably class II-IIb.  He denies orthopnea, PND or significant edema.  He denies significant weight gain.  He denies increased abdominal girth.  He denies syncope or near-syncope.  He sees his PCP later today.  Past Medical History  Diagnosis Date  . Rotator cuff syndrome   . ED (erectile dysfunction)   . Hyperlipidemia   . Hypertension   . CAD (coronary artery disease)   . Goiter   . Shoulder pain, left   . GERD (gastroesophageal reflux disease)   . Sleep apnea, obstructive   . Arthritis     Current Outpatient Prescriptions  Medication Sig Dispense Refill  . amLODipine (NORVASC) 5 MG  tablet Take 1 tablet (5 mg total) by mouth daily.  30 tablet  6  . aspirin 81 MG EC tablet Take 81 mg by mouth daily.        . carvedilol (COREG) 12.5 MG tablet Take 12.5 mg by mouth 2 (two) times daily with a meal.        . isosorbide mononitrate (IMDUR) 30 MG 24 hr tablet Take 0.5 tablets (15 mg total) by mouth daily.  30 tablet  3  . lisinopril (PRINIVIL,ZESTRIL) 40 MG tablet Take 1 tablet (40 mg total) by mouth daily.  90 tablet  2  . niacin 500 MG tablet Take 500 mg by mouth 2 (two) times daily. Take an 81 mg Aspirin 30 minutes before taking this medication to prevent a possible skin flushing reaction       . nitroGLYCERIN (NITROSTAT) 0.4 MG SL tablet Place 0.4 mg under the tongue every 5 (five) minutes. Up to 3 times as needed for chest pain       . omeprazole (PRILOSEC) 20 MG capsule TAKE ONE CAPSULE BY MOUTH ONE TIME DAILY  30 capsule  5  . pravastatin (PRAVACHOL) 80 MG tablet TAKE  ONE TABLET BY MOUTH NIGHTLY AT BEDTIME  30 tablet  5  . timolol (TIMOPTIC) 0.5 % ophthalmic solution Place 1 drop into the right eye 2 (two) times daily.         Allergies: No Known Allergies  History  Substance Use Topics  . Smoking status: Never Smoker   . Smokeless tobacco: Not on file  . Alcohol Use: No     ROS:  Please see the history of present illness.   He denies melena, hematochezia, hematemesis, hemoptysis.  All other systems reviewed and negative.   PHYSICAL EXAM: VS:  BP 133/72  Pulse 74  Ht 5\' 11"  (1.803 m)  Wt 266 lb (120.657 kg)  BMI 37.10 kg/m2 Well nourished, well developed, in no acute distress HEENT: normal Neck: I cannot appreciate JVD Cardiac:  normal S1, S2; RRR; no murmur, No gallop Lungs:  clear to auscultation bilaterally, no wheezing, rhonchi or rales Abd: soft, nontender, no hepatomegaly Ext: no edema; right groin without hematoma or bruit  Skin: warm and dry Neuro:  CNs 2-12 intact, no focal abnormalities noted  EKG:  Sinus rhythm, rate 68, normal axis, inferior  Q waves, nonspecific ST-T wave changes  ASSESSMENT AND PLAN:  1. Coronary Artery Disease  As noted, anatomy was stable at cardiac catheterization and no culprit was identified for his symptoms.  He was apparently placed on low-dose isosorbide.  He does not feel that his symptoms of dyspnea on exertion or getting any worse.  They may be a little bit better.  He remains on aspirin and statin.  Followup with Dr. Daleen Squibb in 6-8 weeks.   2. Anemia  He had microcytic anemia noted in the hospital.  His MCV is much lower than previous.  He sees his PCP today.  I will obtain a CBC today.  We'll also check iron, ferritin, folate.  Hopefully these results will be back by the time he sees his PCP this afternoon.   3. Shortness of breath  I suspect that this is related to his anemia.  It is possible that he may have diastolic dysfunction.  I do not see significant volume overload on exam.  I will obtain a basic metabolic panel and BNP today.  If his BNP is significantly elevated, I will try a low-dose diuretic to see if this helps.  However, if his anemia is much worse, I would not diurese him and would recommend he pursue further workup of his anemia first.   4. HYPERTENSION  Controlled   5. HYPERLIPIDEMIA TYPE IIB / III  Managed by PCP    Signed, Tereso Newcomer, PA-C  11:08 AM 04/02/2011

## 2011-04-02 NOTE — Patient Instructions (Addendum)
Please followup with appointment with Gastroenterologist for possible colonoscopy and upper endoscopy. You do have hidden blood loss in stool at present. If you have darker stools or blood in stool, call us for early appointment or go to ER.  Stop taking Aspirin until evaluation by gastroenterologist.

## 2011-04-02 NOTE — Patient Instructions (Signed)
Your physician recommends that you return for lab work in: TODAY BMET, CBC W/DIFF, BNP   Your physician recommends that you schedule a follow-up appointment in: 6-8 WEEKS WITH DR. Daleen Squibb

## 2011-04-02 NOTE — Progress Notes (Signed)
  Subjective:    Patient ID: William [name], male    DOB: [birth year], 66 y.o.   MRN: [id number]  HPI William [name] is a pleasant 66 year old man with past history of CAD status post CABG in 1999- with recent cardiac catheterization in last week of February comes to the clinic for hospital followup visit. She was admitted by cardiologist from February 24 to February 26 for chest pain and was ruled out for MI. He saw [name] with [name] this morning where CBC, BMP and iron studies were drawn- with pending results which came back when I saw him. He Complaint of Minimal short of breath with mild to moderate exertion- likely NYHA 2-3 heart failure. His recent EF of 65% per 2-D echo. His hemoglobin  dropped from 11.6> 9.5  today in 3 weeks . His iron studies showed ferritin 5.7 with MCV of 68 . He denies any gross blood in stool but does report having dark stools .  His last colonoscopy was in 2008 which had 3 polyps removed which were not carcinogenic - per [name].   I did his rectal exam in clinic today which showed positive for FOBT but no gross blood.   he denies any nausea vomiting, fever, chills, abdominal pain, recent weight loss, diarrhea.  He does endorse having bilateral calf pain with minimal walking- which is going on for many years- most likely from combination of PVD and lumbar spinal stenosis  Review of Systems    As per history of present illness, all other systems reviewed and negative.  Objective:   Physical Exam  Constitutional: Vital signs reviewed.  Patient is a well-developed and well-nourished  in no acute distress and cooperative with exam. Alert and oriented x3.  Head: Normocephalic and atraumatic Mouth: no erythema or exudates, MMM Eyes: PERRL, EOMI, conjunctivae pale, No scleral icterus.  Neck: Supple, Trachea midline normal ROM, No JVD, mass, thyromegaly, or carotid bruit present.  Cardiovascular: RRR, S1 normal, S2 normal, no MRG, pulses symmetric  and intact bilaterally Pulmonary/Chest: CTAB, no wheezes, rales, or rhonchi Abdominal: Soft. Non-tender, non-distended, bowel sounds are normal, no masses, organomegaly, or guarding present.  Per Rectal exam : Good sphincter tone . No gross blood - brown stool with FOBT positive.  Musculoskeletal: No joint deformities, erythema, or stiffness, ROM full and no nontender Hematology: no cervical, inginal, or axillary adenopathy.  Neurological: A&O x3, Strenght is normal and symmetric bilaterally, cranial nerve II-XII are grossly intact, no focal motor deficit, sensory intact to light touch bilaterally.  Skin: Warm, dry and intact. No rash, cyanosis, or clubbing.        Assessment & Plan:

## 2011-04-02 NOTE — Assessment & Plan Note (Addendum)
Hemoglobin dropped of 2 g in about 3 weeks- with no gross blood but FOBT positive. Symptomatic with shortness of breath with exertion. No active chest pain.  Ferritin 5.7 and other results of iron studies supporting iron deficiency anemia.  Last colonoscopy removed 3 polyps noncancerous- in 2008.  Rereferral to [name] with gastroenterology for further lower/upper GI endoscopy- appointment on 04/04/2011. I will see him back after that appointment. Held his aspirin. Will not supplement oral iron today- will do after GI evaluation. Discussed the plan with Dr. Maurice March and he is in agreement with outpatient GI followup. Instructed to go to ER as soon as possible if he gets severely short of breath, has large bloody bowel movement or starts having chest pain. He verbalized understanding.

## 2011-04-02 NOTE — Assessment & Plan Note (Signed)
Denies any abdominal pain at present. Probably needs upper endoscopy if colonoscopy negative- rule out bleeding peptic ulcer. Will defer management to [name] with GI who will see him on 04/04/2011.

## 2011-04-04 ENCOUNTER — Telehealth: Payer: Self-pay | Admitting: *Deleted

## 2011-04-04 NOTE — Telephone Encounter (Signed)
Message copied by Tarri Fuller on Fri Apr 04, 2011  3:09 PM ------      Message from: [place], [place] T      Created: Wed Apr 02, 2011  5:28 PM       He is iron deficient and needs further workup with PCP      Forward results to PCP      No signs of CHF.      Needs follow up with PCP - should have seen today.      Tereso Newcomer, PA-C  5:28 PM 04/02/2011

## 2011-04-04 NOTE — Telephone Encounter (Signed)
pt notified of lab results and is scheduled to have an EGD in April per Dr. Allena Katz, will send these results to Dr. Allena Katz today. Danielle Rankin

## 2011-04-07 ENCOUNTER — Other Ambulatory Visit: Payer: Self-pay | Admitting: Internal Medicine

## 2011-04-10 ENCOUNTER — Telehealth: Payer: Self-pay | Admitting: Cardiology

## 2011-04-10 NOTE — Telephone Encounter (Signed)
New problem:  Per Melissa calling  Patient was seen on 3/22 for anemia. Pt is schedule for endo/ colonoscopy on 4/22 at [place]. From cardiology stand point can patient be under sedation.

## 2011-04-10 NOTE — Telephone Encounter (Signed)
He just had heart cath with stable anatomy and continued medical therapy. Ok to have colonoscopy. Tereso Newcomer, PA-C  1:47 PM 04/10/2011

## 2011-05-05 ENCOUNTER — Ambulatory Visit (HOSPITAL_COMMUNITY)
Admission: RE | Admit: 2011-05-05 | Discharge: 2011-05-05 | Disposition: A | Discharge status: Home / Self Care | Payer: Medicare Other | Source: Ambulatory Visit | Attending: Gastroenterology | Admitting: Gastroenterology

## 2011-05-05 ENCOUNTER — Encounter (HOSPITAL_COMMUNITY): Payer: Self-pay | Admitting: *Deleted

## 2011-05-05 ENCOUNTER — Encounter (HOSPITAL_COMMUNITY)
Admission: RE | Disposition: A | Discharge status: Home / Self Care | Payer: Self-pay | Source: Ambulatory Visit | Attending: Gastroenterology

## 2011-05-05 DIAGNOSIS — Z860100 Personal history of colon polyps, unspecified: Secondary | ICD-10-CM

## 2011-05-05 DIAGNOSIS — D509 Iron deficiency anemia, unspecified: Secondary | ICD-10-CM | POA: Insufficient documentation

## 2011-05-05 DIAGNOSIS — K648 Other hemorrhoids: Secondary | ICD-10-CM | POA: Insufficient documentation

## 2011-05-05 DIAGNOSIS — D126 Benign neoplasm of colon, unspecified: Secondary | ICD-10-CM | POA: Insufficient documentation

## 2011-05-05 HISTORY — DX: Angina pectoris, unspecified: I20.9

## 2011-05-05 HISTORY — DX: Acute myocardial infarction, unspecified: I21.9

## 2011-05-05 HISTORY — DX: Blindness, one eye, unspecified eye: H54.40

## 2011-05-05 HISTORY — PX: ESOPHAGOGASTRODUODENOSCOPY: SHX5428

## 2011-05-05 HISTORY — DX: Other specified postprocedural states: Z98.890

## 2011-05-05 HISTORY — PX: COLONOSCOPY: SHX5424

## 2011-05-05 HISTORY — DX: Polyneuropathy, unspecified: G62.9

## 2011-05-05 SURGERY — EGD (ESOPHAGOGASTRODUODENOSCOPY)
Anesthesia: Moderate Sedation

## 2011-05-05 MED ORDER — FENTANYL NICU IV SYRINGE 50 MCG/ML
INJECTION | INTRAMUSCULAR | Status: DC | PRN
  Administered 2011-05-05 (×3): 25 ug via INTRAVENOUS

## 2011-05-05 MED ORDER — DIPHENHYDRAMINE HCL 50 MG/ML IJ SOLN
INTRAMUSCULAR | Status: AC
  Filled 2011-05-05: qty 1

## 2011-05-05 MED ORDER — FENTANYL CITRATE 0.05 MG/ML IJ SOLN
INTRAMUSCULAR | Status: AC
  Filled 2011-05-05: qty 4

## 2011-05-05 MED ORDER — MIDAZOLAM HCL 10 MG/2ML IJ SOLN
INTRAMUSCULAR | Status: AC
  Filled 2011-05-05: qty 4

## 2011-05-05 MED ORDER — MIDAZOLAM HCL 10 MG/2ML IJ SOLN
INTRAMUSCULAR | Status: DC | PRN
  Administered 2011-05-05: 2 mg via INTRAVENOUS
  Administered 2011-05-05: 1 mg via INTRAVENOUS
  Administered 2011-05-05 (×2): 2 mg via INTRAVENOUS

## 2011-05-05 MED ORDER — SODIUM CHLORIDE 0.9 % IV SOLN: Freq: Once | INTRAVENOUS | Status: DC

## 2011-05-05 NOTE — Discharge Instructions (Addendum)
No aspirin products for 14 days. Will call you with the pathology results when available.  Colonoscopy Care After Read the instructions outlined below and refer to this sheet in the next few weeks. These discharge instructions provide you with general information on caring for yourself after you leave the hospital. Your doctor may also give you specific instructions. While your treatment has been planned according to the most current medical practices available, unavoidable complications occasionally occur. If you have any problems or questions after discharge, call your doctor. HOME CARE INSTRUCTIONS ACTIVITY:  You may resume your regular activity, but move at a slower pace for the next 24 hours.   Take frequent rest periods for the next 24 hours.   Walking will help get rid of the air and reduce the bloated feeling in your belly (abdomen).   No driving for 24 hours (because of the medicine (anesthesia) used during the test).   You may shower.   Do not sign any important legal documents or operate any machinery for 24 hours (because of the anesthesia used during the test).  NUTRITION:  Drink plenty of fluids.   You may resume your normal diet as instructed by your doctor.   Begin with a light meal and progress to your normal diet. Heavy or fried foods are harder to digest and may make you feel sick to your stomach (nauseated).   Avoid alcoholic beverages for 24 hours or as instructed.  MEDICATIONS:  You may resume your normal medications unless your doctor tells you otherwise.  WHAT TO EXPECT TODAY:  Some feelings of bloating in the abdomen.   Passage of more gas than usual.   Spotting of blood in your stool or on the toilet paper.  IF YOU HAD POLYPS REMOVED DURING THE COLONOSCOPY:  No aspirin products for 7 days or as instructed.   No alcohol for 7 days or as instructed.   Eat a soft diet for the next 24 hours.  FINDING OUT THE RESULTS OF YOUR TEST Not all test results  are available during your visit. If your test results are not back during the visit, make an appointment with your caregiver to find out the results. Do not assume everything is normal if you have not heard from your caregiver or the medical facility. It is important for you to follow up on all of your test results.  SEEK IMMEDIATE MEDICAL CARE IF:  You have more than a spotting of blood in your stool.   Your belly is swollen (abdominal distention).   You are nauseated or vomiting.   You have a fever.   You have abdominal pain or discomfort that is severe or gets worse throughout the day.   Endoscopy Care After Please read the instructions outlined below and refer to this sheet in the next few weeks. These discharge instructions provide you with general information on caring for yourself after you leave the hospital. Your doctor may also give you specific instructions. While your treatment has been planned according to the most current medical practices available, unavoidable complications occasionally occur. If you have any problems or questions after discharge, please call your doctor. HOME CARE INSTRUCTIONS Activity  You may resume your regular activity but move at a slower pace for the next 24 hours.   Take frequent rest periods for the next 24 hours.   Walking will help expel (get rid of) the air and reduce the bloated feeling in your abdomen.   No driving for 24 hours (  because of the anesthesia (medicine) used during the test).   You may shower.   Do not sign any important legal documents or operate any machinery for 24 hours (because of the anesthesia used during the test).  Nutrition  Drink plenty of fluids.   You may resume your normal diet.   Begin with a light meal and progress to your normal diet.   Avoid alcoholic beverages for 24 hours or as instructed by your caregiver.  Medications You may resume your normal medications unless your caregiver tells you  otherwise. What you can expect today  You may experience abdominal discomfort such as a feeling of fullness or "gas" pains.   You may experience a sore throat for 2 to 3 days. This is normal. Gargling with salt water may help this.  Follow-up Your doctor will discuss the results of your test with you. SEEK IMMEDIATE MEDICAL CARE IF:  You have excessive nausea (feeling sick to your stomach) and/or vomiting.   You have severe abdominal pain and distention (swelling).   You have trouble swallowing.   You have a temperature over 100 F (37.8 C).   You have rectal bleeding or vomiting of blood.   Moderate Sedation, Adult Moderate sedation is given to help you relax or even sleep through a procedure. You may remain sleepy, be clumsy, or have poor balance for several hours following this procedure. Arrange for a responsible adult, family member, or friend to take you home. A responsible adult should stay with you for at least 24 hours or until the medicines have worn off.  Do not participate in any activities where you could become injured for the next 24 hours, or until you feel normal again. Do not:   Drive.   Swim.   Ride a bicycle.   Operate heavy machinery.   Cook.   Use power tools.   Climb ladders.   Work at [employer].   Do not make important decisions or sign legal documents until you are improved.   Vomiting may occur if you eat too soon. When you can drink without vomiting, try water, juice, or soup. Try solid foods if you feel little or no nausea.   Only take over-the-counter or prescription medications for pain, discomfort, or fever as directed by your caregiver.If pain medications have been prescribed for you, ask your caregiver how soon it is safe to take them.   Make sure you and your family fully understands everything about the medication given to you. Make sure you understand what side effects may occur.   You should not drink alcohol, take sleeping pills,  or medications that cause drowsiness for at least 24 hours.   If you smoke, do not smoke alone.   If you are feeling better, you may resume normal activities 24 hours after receiving sedation.   Keep all appointments as scheduled. Follow all instructions.   Ask questions if you do not understand.  SEEK MEDICAL CARE IF:   Your skin is pale or bluish in color.   You continue to feel sick to your stomach (nauseous) or throw up (vomit).   Your pain is getting worse and not helped by medication.   You have bleeding or swelling.   You are still sleepy or feeling clumsy after 24 hours.  SEEK IMMEDIATE MEDICAL CARE IF:   You develop a rash.   You have difficulty breathing.   You develop any type of allergic problem.   You have a fever.  Document  Released: 09/24/2000 Document Revised: 12/19/2010 Document Reviewed: 02/15/2007 [facility] Patient Information 2012 [place], [place].

## 2011-05-05 NOTE — Interval H&P Note (Signed)
History and Physical Interval Note:  05/05/2011 9:52 AM  William [name]  has presented today for surgery, with the diagnosis of anemia  The various methods of treatment have been discussed with the patient and family. After consideration of risks, benefits and other options for treatment, the patient has consented to  Procedure(s) (LRB): ESOPHAGOGASTRODUODENOSCOPY (EGD) (N/A) COLONOSCOPY (N/A) as a surgical intervention .  The patients' history has been reviewed, patient examined, no change in status, stable for surgery.  I have reviewed the patients' chart and labs.  Questions were answered to the patient's satisfaction.     Jerolyn Flenniken C.

## 2011-05-05 NOTE — Op Note (Addendum)
[facility] [address] [place], [place]  [address]  COLONOSCOPY PROCEDURE REPORT  PATIENT:  William [name], William [name]  MR#:  [id number] BIRTHDATE:  1945-11-29, 65 yrs. old  GENDER:  male ENDOSCOPIST:  Charlott Rakes, MD REF. BY:  Ileana Roup, M.D. PROCEDURE DATE:  05/05/2011 PROCEDURE:  Colonoscopy with snare polypectomy ASA CLASS:  Class III INDICATIONS:  Iron deficiency anemia, history of polyps, heme positive stool MEDICATIONS:   Fentanyl 75 mcg IV, Versed 6 mg IV  DESCRIPTION OF PROCEDURE:   After the risks benefits and alternatives of the procedure were thoroughly explained, informed consent was obtained.  The [id number] ([id number]) endoscope was introduced through the anus and advanced to the cecum, which was identified by both the appendix and ileocecal valve, without limitations.  The quality of the prep was fair. The instrument was then slowly withdrawn as the colon was fully examined. <<PROCEDUREIMAGES>>  FINDINGS:  Rectal exam unremarkable.  Pediatric colonoscope inserted into the colon and during insertion a 5 mm sessile polyp was removed with snare cautery from the transverse colon. The colonoscope was then advanced to the cecum, where the appendiceal orifice and ileocecal valve were identified.    The terminal ileum was intubated and was normal in appearance. On careful withdrawal of the colonoscope no other mucosal abnormalities were noted. Retroflexion revealed medium-sized internal hemorrhoids. COMPLICATIONS:  None  IMPRESSION:      1. Colon polyp - status post snare cautery (polyp not able to be retrieved) 2. Medium sized internal hemorrhoids  RECOMMENDATIONS:   1. Repeat in 5 years due to history of colon polyps 2. High fiber diet 3. Hold aspirin products for 14 days  ______________________________ Charlott Rakes, MD  CC:  Margarito Liner, MD  n. REVISED:  05/05/2011 11:05 AM eSIGNED:   Charlott Rakes at 05/05/2011 11:05  AM  Princella Pellegrini, [phone]

## 2011-05-05 NOTE — OR Nursing (Signed)
During pt's colonoscopy today, a small polyp was removed via hot snare, but was unable to be collected in the suction system.  MD aware.  Angelique Blonder, RN

## 2011-05-05 NOTE — H&P (Addendum)
  See scanned H and P. Date of Initial H&P: 04/04/11  History reviewed, patient examined, no change in status, stable for surgery.

## 2011-05-05 NOTE — Op Note (Signed)
William [name] William [name] [facility] [address] [place], [place]  [address]  ENDOSCOPY PROCEDURE REPORT  PATIENT:  William, [name]  MR#:  [id number] BIRTHDATE:  1945-09-11, 65 yrs. old  GENDER:  male  ENDOSCOPIST:  Charlott Rakes, MD Referred by:  Ileana Roup, M.D.  PROCEDURE DATE:  05/05/2011 PROCEDURE:  EGD, diagnostic 43235 ASA CLASS:  Class III INDICATIONS:  MEDICATIONS:   Versed 1 mg IV, There was residual sedation effect present from prior procedure.  TOPICAL ANESTHETIC:  DESCRIPTION OF PROCEDURE:   After the risks benefits and alternatives of the procedure were thoroughly explained, informed consent was obtained.  The Pentax Colonoscope [id number], [id number]) and [id number] ([id number]) endoscope was introduced through the mouth and advanced to the second portion of the duodenum, without limitations.  The instrument was slowly withdrawn as the mucosa was fully examined. <<PROCEDUREIMAGES>>  FINDINGS:  The endoscope was inserted into the oropharynx and esophagus was intubated.  The gastroesophageal junction was noted to be 45 cm from the incisors. Endoscope was advanced into the stomach, which was unremarkable. The endoscope was advanced to the duodenal bulb and second portion of duodenum which were unremarkable.  Biopsies were taken of the second portion of the duodenum to check for celiac sprue. The endoscope was withdrawn back into the stomach and retroflexion was done which was unremarkable.  COMPLICATIONS:  None  ENDOSCOPIC IMPRESSION:  Normal endoscopy status post duodenal biopsies to check for celiac sprue  RECOMMENDATIONS:  F/U on path  REPEAT EXAM:  N/A  ______________________________ Charlott Rakes, MD  CC:  Margarito Liner, MD  n. Rosalie DoctorCharlott Rakes at 05/05/2011 11:03 AM  Princella Pellegrini, [phone]

## 2011-05-06 ENCOUNTER — Encounter (HOSPITAL_COMMUNITY): Payer: Self-pay | Admitting: Gastroenterology

## 2011-05-12 ENCOUNTER — Ambulatory Visit (INDEPENDENT_AMBULATORY_CARE_PROVIDER_SITE_OTHER): Payer: Medicare Other | Admitting: Internal Medicine

## 2011-05-12 ENCOUNTER — Encounter: Payer: Self-pay | Admitting: Internal Medicine

## 2011-05-12 ENCOUNTER — Ambulatory Visit (HOSPITAL_COMMUNITY)
Admission: RE | Admit: 2011-05-12 | Discharge: 2011-05-12 | Disposition: A | Discharge status: Home / Self Care | Payer: Medicare Other | Source: Ambulatory Visit | Attending: Internal Medicine | Admitting: Internal Medicine

## 2011-05-12 VITALS — BP 156/85 | HR 73 | Temp 98.1°F | Ht 71.0 in | Wt 271.0 lb

## 2011-05-12 DIAGNOSIS — I10 Essential (primary) hypertension: Secondary | ICD-10-CM

## 2011-05-12 DIAGNOSIS — R1032 Left lower quadrant pain: Secondary | ICD-10-CM

## 2011-05-12 DIAGNOSIS — I7090 Unspecified atherosclerosis: Secondary | ICD-10-CM | POA: Insufficient documentation

## 2011-05-12 DIAGNOSIS — M25559 Pain in unspecified hip: Secondary | ICD-10-CM | POA: Insufficient documentation

## 2011-05-12 DIAGNOSIS — D649 Anemia, unspecified: Secondary | ICD-10-CM

## 2011-05-12 DIAGNOSIS — I209 Angina pectoris, unspecified: Secondary | ICD-10-CM

## 2011-05-12 LAB — CBC WITH DIFFERENTIAL/PLATELET
Basophils Absolute: 0 10*3/uL (ref 0.0–0.1)
Eosinophils Absolute: 0.1 10*3/uL (ref 0.0–0.7)
Lymphs Abs: 1.7 10*3/uL (ref 0.7–4.0)
MCH: 20.5 pg — ABNORMAL LOW (ref 26.0–34.0)
Neutrophils Relative %: 49 % (ref 43–77)
Platelets: 225 10*3/uL (ref 150–400)
RBC: 4.53 MIL/uL (ref 4.22–5.81)
RDW: 17.5 % — ABNORMAL HIGH (ref 11.5–15.5)
WBC: 4.4 10*3/uL (ref 4.0–10.5)

## 2011-05-12 MED ORDER — FERROUS SULFATE 325 (65 FE) MG PO TABS: 325.0000 mg | ORAL_TABLET | Freq: Three times a day (TID) | ORAL | Status: DC

## 2011-05-12 MED ORDER — TRAMADOL HCL 50 MG PO TABS: 50.0000 mg | ORAL_TABLET | Freq: Four times a day (QID) | ORAL | Status: DC | PRN

## 2011-05-12 NOTE — Assessment & Plan Note (Signed)
Anemia with heme positive stools during last clinic visit- being evaluated in by EGD/colonoscopy by Dr. Charlott Rakes- with no apparent source of bleeding identified. No change in color of stools. He has started taking aspirin again. I will check CBC today. Will start him on ferrous sulfate 325 mg 3 times a day for next 3-4 months- as his ferritin was 5.7. If his hemoglobin is still low, will need further evaluation of anemia. He is not microcytic, has normal folate but has low ferritin levels.

## 2011-05-12 NOTE — Assessment & Plan Note (Addendum)
Left groin pain for past 2-3 weeks. Unclear etiology. Has no apparent triggering event.  Does not have hernia on physical exam. Not on any chronic steroids or not heavy alcoholic- So low risk of having avascular necrosis of hip. He does have a history of disc prolapse and lumbar spinal stenosis-per MRI in 2007. Also has chronic leg pain and claudication. Although- I will start evaluation with left hip x-ray- and give him tramadol on the next visit. Will reevaluate him in 3-4 weeks.

## 2011-05-12 NOTE — Assessment & Plan Note (Signed)
Blood pressure mildly elevated today. We'll continue same medications at present. He is an appointment with cardiology on 05/28/2011.

## 2011-05-12 NOTE — Progress Notes (Signed)
  Subjective:    Patient ID: William [name], male    DOB: 04/30/1945, 66 y.o.   MRN: [id number]  HPI William [name] is a pleasant 66 year old man with past history significant for CAD status post CABG, peptic ulcer disease, hyperlipidemia, arthritis, chronic leg pain who comes the clinic for followup visit. I saw him last month when his hemoglobin was low and his ferritin was 5.7- and had heme positive stools in clinic for which he was referred to William [name] for EGD/colonoscopy- EGD showed duodenitis on colonoscopy showed no apparent abnormalities consistent with chronic bleeding. He denies any blood in stool at present or any change in color of stool. Denies any chest pain, shortness of breath, abdominal pain, diarrhea, constipation, headache, palpitations, nausea or vomiting or recent weight loss. She does complain of left groin pain radiating to thigh- for past 2-3 weeks. Never had this kind of pain before. Describes as sharp shooting- start in groin and goes up to mid thigh- does not cross the knee. Constant, rest pain. Does not significantly get worse with activity. Has history of hernia on left side which was repaired long time before.    Review of Systems    as per history of present illness, all other systems reviewed and negative. Objective:   Physical Exam  General: Mild distress due to left leg pain HEENT: PERRL, EOMI, no scleral icterus Cardiac: S1, S2, RRR, no rubs, murmurs or gallops Pulm: clear to auscultation bilaterally, moving normal volumes of air Abd: soft, nontender, nondistended, BS present. No inguinal hernia on left or right side. Ext: warm and well perfused, no pedal edema Musculoskeletal: Left groin pain-  no tenderness to palpation, pain gets worse with SLR- at about 45. No back pain or spinal tenderness. No abnormalities of right side. Neuro: alert and oriented X3, cranial nerves II-XII grossly intact       Assessment & Plan:

## 2011-05-12 NOTE — Assessment & Plan Note (Addendum)
Stable at present. No complaints of exertional chest pain or shortness of breath.

## 2011-05-12 NOTE — Patient Instructions (Signed)
Please make a followup appointment in 3-4 weeks.  Get the x-ray of your left hip today and I will give you a call if it shows any problems. Meanwhile- take Tylenol for pain or tramadol which I prescribed today for pain as needed-1 tablet every 6 hours. If the pain gets worse, give [place] a call to make an early appointment.  Start taking iron tablets- one tablet 3 times a day on empty stomach with water or juice- for next 4 months. Will check your hemoglobin level today and if it still low, I will call you.

## 2011-05-13 ENCOUNTER — Encounter: Payer: Medicare Other | Admitting: Internal Medicine

## 2011-05-19 ENCOUNTER — Telehealth: Payer: Self-pay | Admitting: *Deleted

## 2011-05-19 NOTE — Telephone Encounter (Signed)
Pt wants to know the results hip x-ray - phone # [phone]. Stanton Kidney Zelda Reames RN 05/19/11  11:55AM

## 2011-05-21 NOTE — Telephone Encounter (Signed)
Called patient- and discussed results of hip x-ray in detail. Pain improved with tramadol. Patient verbalized satisfaction.

## 2011-05-28 ENCOUNTER — Encounter: Payer: Self-pay | Admitting: Cardiology

## 2011-05-28 ENCOUNTER — Ambulatory Visit (INDEPENDENT_AMBULATORY_CARE_PROVIDER_SITE_OTHER): Payer: Medicare Other | Admitting: Cardiology

## 2011-05-28 VITALS — BP 150/90 | HR 72 | Ht 71.0 in | Wt 271.0 lb

## 2011-05-28 DIAGNOSIS — I25810 Atherosclerosis of coronary artery bypass graft(s) without angina pectoris: Secondary | ICD-10-CM

## 2011-05-28 NOTE — Patient Instructions (Signed)
Your physician recommends that you continue on your current medications as directed. Please refer to the Current Medication list given to you today.  Your physician wants you to follow-up in: 1 year. You will receive a reminder letter in the mail two months in advance. If you don't receive a letter, please call our office to schedule the follow-up appointment.  

## 2011-05-28 NOTE — Assessment & Plan Note (Signed)
Stable. Continue aggressive secondary preventative therapy. See [place] back in a year.

## 2011-05-28 NOTE — Progress Notes (Signed)
HPI  [name] comes in today for evaluation and management of his coronary artery disease. He is having no angina or ischemic symptoms. He also has peripheral vascular disease and has had claudication the past. This is stable or perhaps better.  He denies orthopnea, PND or edema. Primary care is following his blood work. He seems to be compliant with his medications. Past Medical History  Diagnosis Date  . Hyperlipidemia   . Hypertension   . CAD (coronary artery disease)   . Goiter     removed  . Shoulder pain, left   . GERD (gastroesophageal reflux disease)   . Arthritis   . ED (erectile dysfunction)   . Myocardial infarction 1999  . Angina   . Sleep apnea, obstructive     uses BIPAP at night  . Previous back surgery     discs placed in cervical spine  . Rotator cuff syndrome   . Peripheral neuropathy   . Glaucoma     RT eye  . Blind right eye     Current Outpatient Prescriptions  Medication Sig Dispense Refill  . amLODipine (NORVASC) 5 MG tablet Take 1 tablet (5 mg total) by mouth daily.  30 tablet  6  . carvedilol (COREG) 12.5 MG tablet Take 12.5 mg by mouth 2 (two) times daily with a meal.        . ferrous sulfate 325 (65 FE) MG tablet Take 1 tablet (325 mg total) by mouth 3 (three) times daily.  90 tablet  3  . isosorbide mononitrate (IMDUR) 30 MG 24 hr tablet Take 0.5 tablets (15 mg total) by mouth daily.  30 tablet  3  . lisinopril (PRINIVIL,ZESTRIL) 40 MG tablet Take 1 tablet (40 mg total) by mouth daily.  90 tablet  2  . Multiple Vitamin (MULITIVITAMIN WITH MINERALS) TABS Take 1 tablet by mouth daily.      . niacin 500 MG tablet Take 500 mg by mouth 2 (two) times daily. Take an 81 mg Aspirin 30 minutes before taking this medication to prevent a possible skin flushing reaction       . nitroGLYCERIN (NITROSTAT) 0.4 MG SL tablet Place 0.4 mg under the tongue every 5 (five) minutes. Up to 3 times as needed for chest pain       . omeprazole (PRILOSEC) 20 MG capsule TAKE  ONE CAPSULE BY MOUTH ONE TIME DAILY  30 capsule  5  . pravastatin (PRAVACHOL) 80 MG tablet TAKE  ONE TABLET BY MOUTH NIGHTLY AT BEDTIME  30 tablet  4  . timolol (TIMOPTIC) 0.5 % ophthalmic solution Place 1 drop into the right eye 2 (two) times daily.       . traMADol (ULTRAM) 50 MG tablet Take 1 tablet (50 mg total) by mouth every 6 (six) hours as needed for pain.  60 tablet  0  . DISCONTD: carvedilol (COREG) 25 MG tablet Take 1 tablet (25 mg total) by mouth 2 (two) times daily.  60 tablet  3    No Known Allergies  Family History  Problem Relation Age of Onset  . Heart attack Mother     Died at 61 with MI  . Heart attack Father     Died at unknown age with MI  . Heart disease Sister     CABG  . Heart attack Brother     History   Social History  . Marital Status: Legally Separated    Spouse Name: N/A    Number of Children: N/A  .  Years of Education: N/A   Occupational History  . Not on file.   Social History Main Topics  . Smoking status: Never Smoker   . Smokeless tobacco: Not on file  . Alcohol Use: Yes     once a month, a few at a time  . Drug Use: No     in past, 30 years ago  . Sexually Active: Yes   Other Topics Concern  . Not on file   Social History Narrative  . No narrative on file    ROS ALL NEGATIVE EXCEPT THOSE NOTED IN HPI  PE  General Appearance: well developed, well nourished in no acute distress, obese HEENT: symmetrical face, PERRLA, good dentition  Neck: no JVD, thyromegaly, or adenopathy, trachea midline Chest: symmetric without deformity Cardiac: PMI difficult to appreciate, RRR, normal S1, S2, no gallop or murmur Lung: clear to ausculation and percussion Vascular: all pulses full without bruits  Abdominal: nondistended, nontender, good bowel sounds, no HSM, no bruits Extremities: no cyanosis, clubbing or edema, no sign of DVT, no varicosities  Skin: normal color, no rashes Neuro: alert and oriented x 3, non-focal Pysch: normal  affect  EKG  BMET    Component Value Date/Time   NA 137 04/02/2011 1115   K 4.2 04/02/2011 1115   CL 104 04/02/2011 1115   CO2 26 04/02/2011 1115   GLUCOSE 87 04/02/2011 1115   BUN 13 04/02/2011 1115   CREATININE 0.9 04/02/2011 1115   CREATININE 1.06 06/03/2010 1214   CALCIUM 8.6 04/02/2011 1115   GFRNONAA 88* 03/09/2011 0340   GFRAA >90 03/09/2011 0340    Lipid Panel     Component Value Date/Time   CHOL  Value: 159        ATP III CLASSIFICATION:  <200     mg/dL   Desirable  147-829  mg/dL   Borderline High  >=562    mg/dL   High        01/16/863 0415   TRIG 105 02/13/2010 0415   HDL 32* 02/13/2010 0415   CHOLHDL 5.0 02/13/2010 0415   VLDL 21 02/13/2010 0415   LDLCALC  Value: 106        Total Cholesterol/HDL:CHD Risk Coronary Heart Disease Risk Table                     Men   Women  1/2 Average Risk   3.4   3.3  Average Risk       5.0   4.4  2 X Average Risk   9.6   7.1  3 X Average Risk  23.4   11.0        Use the calculated Patient Ratio above and the CHD Risk Table to determine the patient's CHD Risk.        ATP III CLASSIFICATION (LDL):  <100     mg/dL   Optimal  784-696  mg/dL   Near or Above                    Optimal  130-159  mg/dL   Borderline  295-284  mg/dL   High  >132     mg/dL   Very High* 04/16/100 7253    CBC    Component Value Date/Time   WBC 4.4 05/12/2011 1038   RBC 4.53 05/12/2011 1038   HGB 9.3* 05/12/2011 1038   HCT 32.5* 05/12/2011 1038   PLT 225 05/12/2011 1038   MCV 71.7* 05/12/2011 1038  MCH 20.5* 05/12/2011 1038   MCHC 28.6* 05/12/2011 1038   RDW 17.5* 05/12/2011 1038   LYMPHSABS 1.7 05/12/2011 1038   MONOABS 0.4 05/12/2011 1038   EOSABS 0.1 05/12/2011 1038   BASOSABS 0.0 05/12/2011 1038

## 2011-06-04 ENCOUNTER — Encounter: Payer: Medicare Other | Admitting: Internal Medicine

## 2011-07-07 ENCOUNTER — Other Ambulatory Visit: Payer: Self-pay | Admitting: Internal Medicine

## 2011-07-07 MED ORDER — CARVEDILOL 12.5 MG PO TABS: 12.5000 mg | ORAL_TABLET | Freq: Two times a day (BID) | ORAL | Status: DC

## 2011-10-15 ENCOUNTER — Other Ambulatory Visit: Payer: Self-pay | Admitting: Internal Medicine

## 2011-10-23 ENCOUNTER — Ambulatory Visit (HOSPITAL_COMMUNITY)
Admission: RE | Admit: 2011-10-23 | Discharge: 2011-10-23 | Disposition: A | Discharge status: Home / Self Care | Payer: Medicare Other | Source: Ambulatory Visit | Attending: Internal Medicine | Admitting: Internal Medicine

## 2011-10-23 ENCOUNTER — Ambulatory Visit (INDEPENDENT_AMBULATORY_CARE_PROVIDER_SITE_OTHER): Payer: Medicare Other | Admitting: *Deleted

## 2011-10-23 ENCOUNTER — Ambulatory Visit (INDEPENDENT_AMBULATORY_CARE_PROVIDER_SITE_OTHER): Payer: Medicare Other | Admitting: Internal Medicine

## 2011-10-23 ENCOUNTER — Encounter: Payer: Self-pay | Admitting: *Deleted

## 2011-10-23 VITALS — BP 178/95 | HR 62 | Temp 98.2°F | Ht 70.0 in | Wt 272.7 lb

## 2011-10-23 DIAGNOSIS — I951 Orthostatic hypotension: Secondary | ICD-10-CM

## 2011-10-23 DIAGNOSIS — Z23 Encounter for immunization: Secondary | ICD-10-CM

## 2011-10-23 DIAGNOSIS — I10 Essential (primary) hypertension: Secondary | ICD-10-CM

## 2011-10-23 DIAGNOSIS — R002 Palpitations: Secondary | ICD-10-CM | POA: Insufficient documentation

## 2011-10-23 DIAGNOSIS — R0602 Shortness of breath: Secondary | ICD-10-CM | POA: Insufficient documentation

## 2011-10-23 DIAGNOSIS — D509 Iron deficiency anemia, unspecified: Secondary | ICD-10-CM

## 2011-10-23 LAB — CBC
Platelets: 222 10*3/uL (ref 150–400)
RBC: 4.66 MIL/uL (ref 4.22–5.81)
RDW: 17.7 % — ABNORMAL HIGH (ref 11.5–15.5)
WBC: 4.3 10*3/uL (ref 4.0–10.5)

## 2011-10-23 MED ORDER — AMLODIPINE BESYLATE 10 MG PO TABS: 10.0000 mg | ORAL_TABLET | Freq: Every day | ORAL | Status: DC

## 2011-10-23 NOTE — Progress Notes (Signed)
  Subjective:    Patient ID: William [name], male    DOB: [birth year], 66 y.o.   MRN: [id number]  HPI  Mr. Nedved is a 66 year old man who presents today for a flu shot. While getting his flu shot he mentioned that he is fatigued over the last 4-5 days. He was therefore given a walk-in appointment to be seen. Upon further questioning Mr. Magallon' true complaint is 2-3 weeks of shortness of breath and dyspnea on exertion. This is associated with palpitations but not with chest pressure or pain. He is not short of breath at rest. He states that he is intermittently compliant with his medications but ironically noted that when he does not take his medications he does not feel as short of breath. He has not been around any sick individuals, and has had no changes in his daily habits or medications over the last 2-3 weeks. He's had no lower extremity edema, fevers, shakes, or chills. He denies constipation or true cold intolerance. His skin is not dry. He does have some left ear tinnitus which she describes as hearing his pulse when he wakes up first thing in the morning. It is not always there. Finally, he states he's been taking his iron only once a day. He does not note any difficulty taking the medication but states that is how he was instructed to take it. He denies any bright red blood per rectum, change in stools, or dark tarry stools. He is without other complaints at this time.  Review of Systems  Constitutional: Negative for activity change.  HENT: Positive for tinnitus. Negative for hearing loss, neck pain and ear discharge.   Respiratory: Positive for chest tightness and shortness of breath.   Cardiovascular: Positive for palpitations. Negative for chest pain and leg swelling.  Gastrointestinal: Negative for abdominal pain, diarrhea, constipation and blood in stool.  Skin: Negative for rash.  Neurological: Negative for dizziness, syncope, weakness and numbness.  Psychiatric/Behavioral: Negative  for behavioral problems and agitation.      Objective:   Physical Exam  Nursing note and vitals reviewed. Constitutional: He is oriented to person, place, and time. He appears well-developed and well-nourished. No distress.  HENT:  Head: Normocephalic and atraumatic.  Right Ear: External ear normal.  Left Ear: External ear normal.  Mouth/Throat: Oropharynx is clear and moist. No oropharyngeal exudate.  Eyes: No scleral icterus.  Neck: Normal range of motion. Neck supple. No JVD present.  Cardiovascular: Normal rate, regular rhythm and normal heart sounds.  Exam reveals no gallop and no friction rub.   No murmur heard. Pulmonary/Chest: Effort normal and breath sounds normal. No respiratory distress. He has no wheezes. He has no rales.  Abdominal: Soft. Bowel sounds are normal. He exhibits no distension. There is no tenderness. There is no rebound and no guarding.  Musculoskeletal: Normal range of motion. He exhibits no edema.  Lymphadenopathy:    He has no cervical adenopathy.  Neurological: He is alert and oriented to person, place, and time. He has normal reflexes.  Skin: Skin is warm and dry. No rash noted. He is not diaphoretic. No erythema. No pallor.  Psychiatric: He has a normal mood and affect. His behavior is normal. Judgment and thought content normal.      Assessment & Plan:   Please see problem-oriented charting.

## 2011-10-23 NOTE — Assessment & Plan Note (Signed)
[name] shortness of breath this developed over the last 2-3 weeks. It is associated with palpitations but not chest pain. He has no other constitutional symptoms such as fevers, shakes, or chills. His examination is remarkable for the lack of evidence of volume overload. EKG is unchanged from his baseline in March and demonstrated normal sinus rhythm at 72 beats per minute, normal axis and intervals, no significant Q waves, no LVH by voltage, no ST-T changes. His blood pressure is high and this has been chronic and likely related to his inconsistency in taking his antihypertensive medications. He is without orthostatic hypotension to explain his symptoms as well. Therefore we aren't sure of the cause of his shortness of breath but are concerned there may be a component of a worsening anemia which will be evaluated with a blood draw today along with an iron panel to see if the once daily iron therapy has been sufficient. There may also be some subclinical acute on chronic diastolic heart failure related to his hypertension. In addition to obtaining the CBC, we will increase his amlodipine to 10 mg by mouth daily. We will schedule a followup with his primary care provider, Dr. Allena Katz, at his next available appointment to assess his blood pressure control. We will also call [name] with the results of his CBC should he require an increase in his iron therapy.  For health maintenance he received a flu shot this morning.

## 2011-10-23 NOTE — Progress Notes (Unsigned)
Pt came in for a flu injection and wants to be seen. He is c/o light headed and feeling tired.  Fatigue with ambulation.  Onset 3 days ago. Intake good.

## 2011-10-23 NOTE — Progress Notes (Signed)
Subjective:     Patient ID: William [name], male   DOB: 1945/07/17, 66 y.o.   MRN: [id number]  HPI William [name] has a history of coronary artery disease, hypertension, iron deficiency anemia, and sleep apnea. He presents today because he has been "feeling tired." Over the past 2-3 weeks he has experienced worsening of his shortness of breath with exertion. For instance when walking from the parking deck today, he felt short of breath which was relieved with rest. He describes an associated tingling in his R hand with exertion, and that rain drops on that hand felt unusually cold and gave him chills. There was no associated chest pain, but he endorses some chest pressure occasionally, unchanged recently. No nocturnal dyspnea or orthopnea. He expressed concern that he had his blood pressure checked at a local health fair and it was high two weeks ago. Over the past 2-3 days he has noticed his heart racing with exertion and hears his heat pounding in his left ear. Over the past 2 weeks he has also been dizzy and lightheaded when moving from sitting or lying to standing. His symptoms are worse in the morning when he gets out of bed. He admits to missing doses of his medication, and on those days he does not have dizziness. He takes iron supplements for his chronic anemia, currently 325 mg/d. He last saw his cardiologist in 05/2011 and was doing well at that point.  Review of Systems  Constitutional: Negative for fever, chills and diaphoresis.  Respiratory: Positive for chest tightness and shortness of breath.   Cardiovascular: Positive for palpitations. Negative for chest pain.  Gastrointestinal: Negative for nausea, vomiting, abdominal pain, diarrhea and blood in stool.  Genitourinary: Negative for difficulty urinating.  Neurological: Positive for dizziness and light-headedness. Negative for syncope and headaches.       Objective:   Physical Exam  Constitutional: He appears well-developed and  well-nourished. No distress.  HENT:  Head: Normocephalic and atraumatic.  Neck: JVD present. Carotid bruit is not present.  Cardiovascular: Normal rate, regular rhythm, normal heart sounds and intact distal pulses.   Pulmonary/Chest: Effort normal and breath sounds normal. He has no wheezes. He has no rales.  Abdominal: Soft. He exhibits no mass. There is no tenderness.  Musculoskeletal: He exhibits no edema.  Skin: Skin is warm and dry.   ECG showed normal sinus rhythm, no ST segment changes, essentially unchanged from 04/02/2011.    Assessment:     1. Shortness of breath, R arm paresthesias, palpitations - Symptoms with exertion are concerning for myocardial ischemia. The differential diagnosis includes high output heart failure with worsening anemia, congestive heart failure, acute MI, or arrhythmia. High output failure is most likely given his history of chronic anemia and recent decrease in iron supplementation. CHF is less likely because of absent peripheral edema, lack of evidence of pleural effusion, and recent echocardiogram demonstrating 60% EF. Acute MI is unlikely given the absence of substernal chest pain and the unchanged ECG today. Arrhythmia is unlikely due to normal ECG.  2. Hypertension - Blood pressure is poorly controlled at 178/95 today. It has been elevated at recent visits. His HTN also likely contributes to his anginal symptoms.  3. Dizziness and light headedness - Most likely orthostatic hypotension resulting from antihypertensive medications, given that symptoms occur with position change and seem to be less prominent on days when he doesn't take medication. It is unclear which agent(s) are responsible.    Plan:     1.  Shortness of breath, R arm paresthesias, papitations - We will check CBC, BMP, and an iron panel to assess iron deficiency anemia. We will likely increase his iron supplementation.  2. HTN - His amlodipine was increased from 5 to 10 mg daily for better  blood pressure control. He will return for follow-up on high blood pressure within the next few weeks.  3. Orthostatic hypotension - We will not decrease or discontinue any of his antihypertensive medications at this time due to his hypertension and worsening cardiopulmonary symptoms. Once his blood pressure and symptoms are better controlled, we will consider changes to his regimen.

## 2011-10-23 NOTE — Patient Instructions (Addendum)
Increase your amlodipine to 10 mg once daily. You can take two 5 mg pills until you run out. There is a prescription for you at [facility] with 10 mg pills. We will call you with the results of your lab tests. Follow up with Dr. Allena Katz to check on your blood pressure.

## 2011-10-24 LAB — BASIC METABOLIC PANEL WITH GFR
CO2: 26 mEq/L (ref 19–32)
Calcium: 8.6 mg/dL (ref 8.4–10.5)
Creat: 0.78 mg/dL (ref 0.50–1.35)
GFR, Est African American: >89 mL/min
GFR, Est Non African American: >89 mL/min
Sodium: 137 mEq/L (ref 135–145)

## 2011-10-24 LAB — IRON: Iron: 19 ug/dL — ABNORMAL LOW (ref 42–165)

## 2011-10-24 LAB — FERRITIN: Ferritin: 5 ng/mL — ABNORMAL LOW (ref 22–322)

## 2011-10-27 ENCOUNTER — Telehealth: Payer: Self-pay | Admitting: Internal Medicine

## 2011-10-27 NOTE — Telephone Encounter (Signed)
Discussed results of iron panel and cbc with patient. He states he's been taking iron twice daily without missing doses. Advised him to increase this to three times daily as has been previously prescribed.

## 2011-10-27 NOTE — Telephone Encounter (Signed)
Called Mr. Cone again at [phone] and left a message.

## 2011-10-27 NOTE — Telephone Encounter (Signed)
Called Mr. Walz at both home and listed cell number without answer to discuss results of CBC. Will need to confirm if he is taking his iron supplementation three times daily. Venita Sheffield, can you try to reach him later today again?

## 2011-11-26 ENCOUNTER — Encounter: Payer: Self-pay | Admitting: Internal Medicine

## 2011-11-26 ENCOUNTER — Ambulatory Visit (INDEPENDENT_AMBULATORY_CARE_PROVIDER_SITE_OTHER): Payer: Medicare Other | Admitting: Internal Medicine

## 2011-11-26 VITALS — BP 143/81 | HR 88 | Temp 98.3°F | Ht 70.0 in | Wt 280.0 lb

## 2011-11-26 DIAGNOSIS — M48061 Spinal stenosis, lumbar region without neurogenic claudication: Secondary | ICD-10-CM

## 2011-11-26 DIAGNOSIS — I25810 Atherosclerosis of coronary artery bypass graft(s) without angina pectoris: Secondary | ICD-10-CM

## 2011-11-26 DIAGNOSIS — D509 Iron deficiency anemia, unspecified: Secondary | ICD-10-CM

## 2011-11-26 DIAGNOSIS — G8929 Other chronic pain: Secondary | ICD-10-CM

## 2011-11-26 DIAGNOSIS — I10 Essential (primary) hypertension: Secondary | ICD-10-CM

## 2011-11-26 DIAGNOSIS — M4802 Spinal stenosis, cervical region: Secondary | ICD-10-CM

## 2011-11-26 DIAGNOSIS — E669 Obesity, unspecified: Secondary | ICD-10-CM

## 2011-11-26 DIAGNOSIS — R209 Unspecified disturbances of skin sensation: Secondary | ICD-10-CM

## 2011-11-26 DIAGNOSIS — M79609 Pain in unspecified limb: Secondary | ICD-10-CM

## 2011-11-26 DIAGNOSIS — I739 Peripheral vascular disease, unspecified: Secondary | ICD-10-CM

## 2011-11-26 LAB — CBC WITH DIFFERENTIAL/PLATELET
Basophils Absolute: 0 10*3/uL (ref 0.0–0.1)
Eosinophils Relative: 3 % (ref 0–5)
Lymphocytes Relative: 41 % (ref 12–46)
Lymphs Abs: 1.9 10*3/uL (ref 0.7–4.0)
Neutrophils Relative %: 45 % (ref 43–77)
Platelets: 225 10*3/uL (ref 150–400)
RBC: 4.28 MIL/uL (ref 4.22–5.81)
RDW: 17.9 % — ABNORMAL HIGH (ref 11.5–15.5)
WBC: 4.6 10*3/uL (ref 4.0–10.5)

## 2011-11-26 NOTE — Assessment & Plan Note (Signed)
Please refer to assessment and plan of claudication.

## 2011-11-26 NOTE — Assessment & Plan Note (Addendum)
Patient did have bilateral lower extremity vascular studies in 2008 which was negative for PVD. 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 Also describes symptoms consistent with lumbar spinal stenosis. His claudication- bilateral calf and leg pain with walking, is consistent with lumbar spinal stenosis. Also contribution from iron deficiency anemia playing a role. Symptomatic worsening of claudication for past few months. - Will start with physical therapy and see if using the cane or any other assistive device might help relieve his pain with walking. - Will consider further vascular diagnostic workup if no improvement with above measures.

## 2011-11-26 NOTE — Assessment & Plan Note (Signed)
Refer to claudication.

## 2011-11-26 NOTE — Assessment & Plan Note (Signed)
Lab Results  Component Value Date   NA 137 10/23/2011   K 4.2 10/23/2011   CL 104 10/23/2011   CO2 26 10/23/2011   BUN 8 10/23/2011   CREATININE 0.78 10/23/2011   CREATININE 0.9 04/02/2011    BP Readings from Last 3 Encounters:  11/26/11 143/81  10/23/11 178/95  05/28/11 150/90    Assessment: Hypertension control:  mildly elevated  Progress toward goals:  improved Barriers to meeting goals:  no barriers identified  Plan: Hypertension treatment:  continue current medications. Continue Norvasc, Coreg, lisinopril, Imdur.

## 2011-11-26 NOTE — Assessment & Plan Note (Signed)
Stable. Continue aspirin, beta blocker, statin, ACE inhibitor, Imdur and Nitrostat.

## 2011-11-26 NOTE — Assessment & Plan Note (Signed)
Discussed with him about the importance of diet for weight loss. Patient expressed interest to meet with a nutritionist for diet education for weight loss. - I will make a referral to have an appointment with [facility].

## 2011-11-26 NOTE — Progress Notes (Signed)
  Subjective:    Patient ID: William [name], male    DOB: [birth year], 66 y.o.   MRN: [id number]  HPI patient is a pleasant 66 year old man with past history of and deficiency anemia, lumbar and cervical spinal stenosis, hypertension, CAD status post CABG, and many other chronic issues as per problem list who comes to the clinic for followup visit for anemia and also for bilateral leg and calf pain.  Iron deficiency anemia - He was seen by Dr. Josem Kaufmann about a month before when his hemoglobin and ferritin levels were checked which came back 9.2 and 5 respectively. He reports his shortness of breath being same since last visit. He still gets short of breath when he walks about 10 minutes.  He continues taking ferrous sulfate 3 times a day.  Bilateral leg and calf pain- after about 10 minutes of walking. Relieved with rest. No associated back pain. Although he does not have any pain if he pushes cart- there is walking with bending forward. He had bilateral lower extremity arterial evaluation in 2008 which was normal and showed no peripheral vascular disease.  He also complains of numbness of left hand for past 3 days and reports that it might be from sleeping on left hand. Numbness is getting better slowly. Denies any left arm weakness or neck pain.  He denies any fever, chills, nausea, vomiting, abdominal pain, chest pain, diarrhea. She also denies any change in color of stool or blood in stool.  He also expressed interest to get some help for diet education to lose weight.  Review of Systems    As per history of present illness, all other systems reviewed and negative.  Objective:   Physical Exam  General: NAD HEENT: PERRL, EOMI, no scleral icterus Cardiac: S1, S2, RRR, no rubs, murmurs or gallops Pulm: clear to auscultation bilaterally, moving normal volumes of air Abd: soft, nontender, nondistended, BS present Musculoskeletal: No significant tenderness to palpation of cervical or lumbar  spine. Ext: warm and well perfused, no pedal edema Neuro: alert and oriented X3, cranial nerves II-XII grossly intact       Assessment & Plan:

## 2011-11-26 NOTE — Assessment & Plan Note (Signed)
Patient supposedly on oral iron therapy since April 2013- although with no improvement in hemoglobin or ferritin level. Last hemoglobin level 9.3 on 10/23/2011 and ferritin level 5 on the same day- which was unchanged from previous result. Patient reports taking ferrous sulfate 3 times a day. No apparent symptoms of shortness of breath. Also he does not have any resting SOB. Also does not report any PND or orthopnea. As previously noted, patient has symptomatic iron deficiency anemia and is on appropriate replacement therapy although is not responding well. I will recheck CBC today to check hemoglobin levels. - Although I will see him back in second week of January 2014 and recheck ferritin and hemoglobin levels and if there is no improvement- will consider IV iron replacement therapy. Discussed this with patient and he verbalized understanding.

## 2011-11-26 NOTE — Patient Instructions (Signed)
Please make a followup appointment in 8-9 weeks- around second week of January.  Will get lab test check hemoglobin levels today and I will call you if something is to be changed. Meanwhile keep taking all medications as you do. Followup with physical therapy. Our nutritionist- Lupita Leash Plyler will call you to set an appointment for diet education. Try to cut down on popcorn use added salt and butter.

## 2011-11-26 NOTE — Assessment & Plan Note (Signed)
Unclear etiology. Acute onset after patient woke up from sleep about 3 days before. Likely from unusual sleeping posture. - Conservative management with watchful waiting. - Advised him to call back if numbness does not improve or gets worse.

## 2011-12-04 ENCOUNTER — Telehealth: Payer: Self-pay | Admitting: Internal Medicine

## 2011-12-04 NOTE — Telephone Encounter (Signed)
Repeat CBC showed hemoglobin 8.2 <9.2. Called patient to discuss his results. Patient still taking ferrous sulfate 3 times a day.  Last ferritin level 5. If patient is taking medication, there might be an issue with absorption. Patient will come for return office visit on Monday at 1:15 PM and decide about further diagnostic workup and need to start on IV iron therapy.

## 2011-12-08 ENCOUNTER — Ambulatory Visit (INDEPENDENT_AMBULATORY_CARE_PROVIDER_SITE_OTHER): Payer: Medicare Other | Admitting: Internal Medicine

## 2011-12-08 ENCOUNTER — Encounter: Payer: Self-pay | Admitting: Internal Medicine

## 2011-12-08 VITALS — BP 147/83 | HR 85 | Temp 99.3°F | Ht 70.0 in | Wt 274.0 lb

## 2011-12-08 DIAGNOSIS — I10 Essential (primary) hypertension: Secondary | ICD-10-CM

## 2011-12-08 DIAGNOSIS — D509 Iron deficiency anemia, unspecified: Secondary | ICD-10-CM

## 2011-12-08 NOTE — Assessment & Plan Note (Signed)
Patient not responding to oral iron therapy. After long discussion with Dr. Lonzo Cloud - will check a celiac panel and start him on IV iron therapy. - Patient has an appointment at short stay in [place] on 12/18/2011 for IV iron. He will be started on Fereheme 1020 mg one dose. - Will reevaluate him in clinic in about 3 weeks and check CBC.

## 2011-12-08 NOTE — Progress Notes (Signed)
  Subjective:    Patient ID: William [name], male    DOB: [birth year], 66 y.o.   MRN: [id number]  HPI patient is a pleasant 66 year old man with CAD status post CABG, and deficiency anemia, hypertension and multiple medical problems as per problem list who comes to clinic for followup of his anemia. I saw him earlier this month and check his CBC which shows hemoglobin 8.2 down from 9.2. He is apparently on oral iron therapy since March 2013 and neither his hemoglobin or ferritin has improved. He consistently reports no more blood in stool.  colonoscopy and upper endoscopy in April 2013 did not show any apparent source of bleeding. He gets symptomatic with his anemia- weakness and short of breath. I call him back today to start him on IV iron therapy. Discussed in detail with Dr. Lonzo Cloud about this.   Patient denies any fever, chills, nausea, vomiting, abdominal pain, chest pain, diarrhea. Review of Systems    as per history of present illness. Objective:   Physical Exam  General: NAD HEENT: PERRL, EOMI, no scleral icterus Cardiac: S1, S2, RRR, no rubs, murmurs or gallops Pulm: clear to auscultation bilaterally, moving normal volumes of air Abd: soft, nontender, nondistended, BS present Ext: warm and well perfused, no pedal edema Neuro: alert and oriented X3, cranial nerves II-XII grossly intact       Assessment & Plan:

## 2011-12-08 NOTE — Assessment & Plan Note (Signed)
Lab Results  Component Value Date   NA 137 10/23/2011   K 4.2 10/23/2011   CL 104 10/23/2011   CO2 26 10/23/2011   BUN 8 10/23/2011   CREATININE 0.78 10/23/2011   CREATININE 0.9 04/02/2011    BP Readings from Last 3 Encounters:  12/08/11 147/83  11/26/11 143/81  10/23/11 178/95    Assessment: Hypertension control:  mildly elevated  Progress toward goals:  unchanged Barriers to meeting goals:  no barriers identified  Plan: Hypertension treatment:  continue current medications. Continue Norvasc, Coreg, Imdur, lisinopril.

## 2011-12-08 NOTE — Patient Instructions (Signed)
Make an appointment in our clinic in 3-4 weeks for followup after starting IV iron therapy.  Please followup with short stay at [place] for IV iron therapy.  Continue taking all medications regularly as you do.  I will call you with a lab tests if anything is abnormal.

## 2011-12-17 ENCOUNTER — Other Ambulatory Visit (HOSPITAL_COMMUNITY): Payer: Self-pay | Admitting: *Deleted

## 2011-12-18 ENCOUNTER — Ambulatory Visit (HOSPITAL_COMMUNITY)
Admission: RE | Admit: 2011-12-18 | Discharge: 2011-12-18 | Disposition: A | Discharge status: Home / Self Care | Payer: Medicare Other | Source: Ambulatory Visit | Attending: Internal Medicine | Admitting: Internal Medicine

## 2011-12-18 DIAGNOSIS — IMO0002 Reserved for concepts with insufficient information to code with codable children: Secondary | ICD-10-CM | POA: Insufficient documentation

## 2011-12-18 DIAGNOSIS — D509 Iron deficiency anemia, unspecified: Secondary | ICD-10-CM | POA: Insufficient documentation

## 2011-12-18 MED ORDER — SODIUM CHLORIDE 0.9 % IV SOLN
INTRAVENOUS | Status: DC
  Administered 2011-12-18: 09:00:00 via INTRAVENOUS

## 2011-12-18 MED ORDER — SODIUM CHLORIDE 0.9 % IV SOLN
1020.0000 mg | Freq: Once | INTRAVENOUS | Status: AC
  Administered 2011-12-18: 1020 mg via INTRAVENOUS
  Filled 2011-12-18: qty 34

## 2011-12-29 ENCOUNTER — Other Ambulatory Visit: Payer: Self-pay | Admitting: Internal Medicine

## 2011-12-29 ENCOUNTER — Ambulatory Visit (INDEPENDENT_AMBULATORY_CARE_PROVIDER_SITE_OTHER): Payer: Medicare Other | Admitting: Radiation Oncology

## 2011-12-29 ENCOUNTER — Ambulatory Visit (INDEPENDENT_AMBULATORY_CARE_PROVIDER_SITE_OTHER): Payer: Medicare Other | Admitting: Dietician

## 2011-12-29 VITALS — BP 135/83 | HR 81 | Temp 98.2°F | Ht 71.0 in | Wt 271.1 lb

## 2011-12-29 DIAGNOSIS — E782 Mixed hyperlipidemia: Secondary | ICD-10-CM

## 2011-12-29 DIAGNOSIS — E669 Obesity, unspecified: Secondary | ICD-10-CM

## 2011-12-29 DIAGNOSIS — D509 Iron deficiency anemia, unspecified: Secondary | ICD-10-CM

## 2011-12-29 LAB — CBC
HCT: 34.9 % — ABNORMAL LOW (ref 39.0–52.0)
Hemoglobin: 10.7 g/dL — ABNORMAL LOW (ref 13.0–17.0)
MCH: 21.2 pg — ABNORMAL LOW (ref 26.0–34.0)
MCHC: 30.7 g/dL (ref 30.0–36.0)
MCV: 69.2 fL — ABNORMAL LOW (ref 78.0–100.0)
RDW: 25.4 % — ABNORMAL HIGH (ref 11.5–15.5)
WBC: 3.6 10*3/uL — ABNORMAL LOW (ref 4.0–10.5)

## 2011-12-29 LAB — VITAMIN B12: Vitamin B-12: 411 pg/mL (ref 211–911)

## 2011-12-29 LAB — FERRITIN: Ferritin: 232 ng/mL (ref 22–322)

## 2011-12-29 LAB — IRON: Iron: 54 ug/dL (ref 42–165)

## 2011-12-29 NOTE — Progress Notes (Signed)
Medical Nutrition Therapy:  Appt start time: 1100 end time:  1130.  Assessment:  Primary concerns today: Weight management and Meal planning.  Patient here for weight loss education. He reports limited income impedes healthier choices. Intake however, sounds reasonable and patient making healthier food choices prior to today's visit. Wt 271#, BMI of 37.9 puts patient in Class 3 obesity range.  Usual eating pattern includes 2 meals and 1-2 snacks per day. Usual physical activity includes ADLs. Everyday foods include  Vegetables, meat, eats fruit 1-2 times a week, no nuts.    24-hr recall: B -  Grits,eggs,1/2 slice toast, boiled chicken or salmon, coffee L - was eating a lot of microwave Popcorn, water  D-  potatoes, chicken, greens, water    Progress Towards Goal(s):  In progress.   Nutritional Diagnosis:  NB-1.1 Food and nutrition-related knowledge deficit As related to lack of previous exposure to nutrtion information.  As evidenced by patient report.    Intervention:   Nutrition education about healthy meal planning and high iron diet. Coordination of care- recommend 7-10% weight loss - 20-30# gradually over next year or so. Reasonable weight 220-250#  Monitoring/Evaluation:  Dietary intake, exercise, and body weight prn.

## 2011-12-29 NOTE — Progress Notes (Signed)
  Subjective:    Patient ID: William [name], male    DOB: [birth year], 66 y.o.   MRN: [id number]  HPI Patient is a 66 year old man with past medical history significant for iron deficiency anemia who presents to clinic today for followup on this issue. The patient was last seen in clinic on 12/08/2011, at which time his iron deficiency anemia continued to persist despite being prescribed oral ferrous sulfate therapy. He was subsequent sent for IV iron therapy on 12/18/2011, which he completed.  At today's visit, the patient admits that he actually had not been taking his oral iron supplements, apparently due to some confusion he has had regarding his medications. The patient states that he now understands which medication is ferrous sulfate following a discussion with his pharmacist, and that he has been taking his oral iron for approximately one week. He states that since receiving IV iron on 12/18/2011, his shortness of breath has greatly improved. Denies chest pain. Denies any bloody bowel movements or black, tarry stools. Denies hematuria.  The patient's only other complaint today is of "burning" in his bilateral feet when placing them on a "very cold floor"while barefoot. The patient admits that he has only these symptoms with extreme cold exposure, and that this problem is chronic for many years. The patient was reassured that this is likely a normal response to cold temperatures, and was encouraged to pursue the issue further at future followup if it did not improve or worsened.  Review of Systems  Constitutional: Negative for fever and chills.  HENT: Negative.   Eyes: Negative.   Respiratory: Positive for shortness of breath (improved). Negative for cough and wheezing.   Cardiovascular: Negative for chest pain and leg swelling.  Gastrointestinal: Negative for nausea, vomiting, abdominal pain, diarrhea and blood in stool.  Genitourinary: Negative for dysuria and hematuria.  Musculoskeletal:  Negative.   Skin: Negative.   Neurological: Negative.   Hematological: Negative.   Psychiatric/Behavioral: Negative.        Objective:   Physical Exam  Constitutional: He is oriented to person, place, and time. He appears well-developed and well-nourished. No distress.  HENT:  Head: Normocephalic and atraumatic.  Mouth/Throat: Oropharynx is clear and moist.  Eyes: Pupils are equal, round, and reactive to light. No scleral icterus.  Neck: Normal range of motion. Neck supple. No tracheal deviation present.  Cardiovascular: Normal rate and regular rhythm.   No murmur heard. Pulmonary/Chest: Effort normal. He has no wheezes. He has no rales.  Abdominal: Soft. Bowel sounds are normal. He exhibits no distension. There is no tenderness.  Musculoskeletal: Normal range of motion. He exhibits no edema and no tenderness.  Neurological: He is alert and oriented to person, place, and time. No cranial nerve deficit.  Skin: Skin is warm and dry. No erythema.  Psychiatric: He has a normal mood and affect. His behavior is normal.          Assessment & Plan:

## 2011-12-29 NOTE — Assessment & Plan Note (Addendum)
Review of medical records in Epic suggest that the patient has been anemic since approximately 2009. Although the patient's iron deficiency anemia appeared initially to be recalcitrant to oral iron therapy, it has become evident that the patient actually has not been compliant with his oral iron due to lack of understanding of his medications. This issue has been addressed, and he is now taking his oral iron as prescribed. There should be no further need for IV iron at this time, and suspect patient's symptoms will continue to improve with continued oral iron supplementation.   The remaining issue regarding the patient's iron deficiency anemia is the still undetermined underlying etiology. Endoscopy in 04/2011 was negative for visible abnormalities. Sampling of the duodenum performed at time of endoscopy was negative for evidence of celiac disease (4 samples), but consistent with duodenitis. Colonoscopy in 04/2011 revealed one polyp, which was removed, and also internal hemorrhoids, neither of which would seem to account for the degree of the patient's anemia and iron deficiency. Patient denies hematuria, however he has not had urinalysis performed since 2008. No apparent history of hemolytic anemia. No apparent history of malabsorption. -CBC -iron/ferritin -B12 -IgA tissue transglutaminase -Peripheral smear -Urinalysis -If workup negative, consider investigation into possible hemolytic anemia

## 2011-12-29 NOTE — Patient Instructions (Addendum)
Instructions: - Please continue taking your iron supplements as prescribed.  - Contact us if your shortness of breath worsens again, or you develop any other new symptoms - We will have you follow-up in 1 month to go over the results of today's blood work

## 2011-12-29 NOTE — Addendum Note (Signed)
Addended by: Remus Blake on: 12/29/2011 11:01 AM   Modules accepted: Orders

## 2011-12-30 LAB — MANUAL DIFFERENTIAL
Bands Manual: 0 % (ref 0–5)
Eosinophils Manual: 4 % (ref 0–5)
Metamyelocytes Manual: 0 %
Monocytes Manual: 6 % (ref 3–12)
Myelocytes Manual: 0 %
Neutrophils Manual: 52 % (ref 43–77)

## 2011-12-30 LAB — URINALYSIS, ROUTINE W REFLEX MICROSCOPIC
Bilirubin Urine: NEGATIVE
Glucose, UA: NEGATIVE mg/dL
Hgb urine dipstick: NEGATIVE
Ketones, ur: NEGATIVE mg/dL
Leukocytes, UA: NEGATIVE
Protein, ur: NEGATIVE mg/dL
Specific Gravity, Urine: 1.022 (ref 1.005–1.030)
Urobilinogen, UA: 1 mg/dL (ref 0.0–1.0)

## 2012-01-01 NOTE — Addendum Note (Signed)
Addended by: Neomia Dear on: 01/01/2012 05:49 PM   Modules accepted: Orders

## 2012-01-20 ENCOUNTER — Other Ambulatory Visit: Payer: Self-pay | Admitting: Internal Medicine

## 2012-02-11 ENCOUNTER — Encounter: Payer: Self-pay | Admitting: Internal Medicine

## 2012-02-11 ENCOUNTER — Ambulatory Visit (INDEPENDENT_AMBULATORY_CARE_PROVIDER_SITE_OTHER): Payer: Medicare Other | Admitting: Internal Medicine

## 2012-02-11 VITALS — BP 127/87 | HR 78 | Temp 97.0°F | Resp 20 | Ht 69.5 in | Wt 272.9 lb

## 2012-02-11 DIAGNOSIS — D509 Iron deficiency anemia, unspecified: Secondary | ICD-10-CM

## 2012-02-11 DIAGNOSIS — G8929 Other chronic pain: Secondary | ICD-10-CM

## 2012-02-11 DIAGNOSIS — M79609 Pain in unspecified limb: Secondary | ICD-10-CM

## 2012-02-11 DIAGNOSIS — I10 Essential (primary) hypertension: Secondary | ICD-10-CM

## 2012-02-11 NOTE — Assessment & Plan Note (Signed)
Some improvement in symptoms with improvement in hemoglobin levels.

## 2012-02-11 NOTE — Assessment & Plan Note (Signed)
Lab Results  Component Value Date   NA 137 10/23/2011   K 4.2 10/23/2011   CL 104 10/23/2011   CO2 26 10/23/2011   BUN 8 10/23/2011   CREATININE 0.78 10/23/2011   CREATININE 0.9 04/02/2011    BP Readings from Last 3 Encounters:  02/11/12 127/87  12/29/11 135/83  12/18/11 157/76    Assessment: Hypertension control:  controlled  Progress toward goals:  at goal Barriers to meeting goals:  no barriers identified  Plan: Hypertension treatment:  continue current medications. Continue amlodipine, Coreg, Imdur, lisinopril.

## 2012-02-11 NOTE — Patient Instructions (Signed)
Please make followup appointment in 3-4 months- sometime in May.  Continue taking all medications as you do including your ferrous sulfate 3 times a day.  I will call you if your hemoglobin count has not improved.

## 2012-02-11 NOTE — Assessment & Plan Note (Addendum)
Reported symptomatic improvement. Recheck CBC with differential. Continue oral iron supplementation. Previous extensive evaluation and negative for any significant etiology.

## 2012-02-11 NOTE — Progress Notes (Signed)
  Subjective:    Patient ID: William [name], male    DOB: [birth year], 67 y.o.   MRN: [id number]  HPI William [name] is a pleasant 67 year old man with multiple medical issues including hypertension, hyperlipidemia,ion deficiency anemia, CAD no claudication and multiple other problems as per problem list who comes to the clinic for a followup visit for anemia. He is suffering from iron deficiency anemia for about a year or more. There was apparently some confusion about understanding his oral iron supplementation- which is resolved now, but due to which he got a dose of IV iron. He is off IV iron now. He reports significant improvement in his symptoms and performance of ADLs- he denies any more short of breath and also reports improvement in his energy and leg pain.  He denies any fever, chills, nausea vomiting, abdominal pain, chest pain, diarrhea, headache.  He does report he had a pneumonia shot one or 2 years before when he was in the hospital.   Review of Systems    as per history of present illness. Objective:   Physical Exam  General: NAD HEENT: PERRL, EOMI, no scleral icterus Cardiac: S1, S2, RRR, no rubs, murmurs or gallops Pulm: clear to auscultation bilaterally, moving normal volumes of air Abd: soft, nontender, nondistended, BS present Ext: warm and well perfused, no pedal edema Neuro: alert and oriented X3, cranial nerves II-XII grossly intact       Assessment & Plan:

## 2012-02-12 LAB — CBC WITH DIFFERENTIAL/PLATELET
Basophils Absolute: 0 10*3/uL (ref 0.0–0.1)
Basophils Relative: 0 % (ref 0–1)
Eosinophils Absolute: 0.1 10*3/uL (ref 0.0–0.7)
Eosinophils Relative: 2 % (ref 0–5)
HCT: 42.5 % (ref 39.0–52.0)
Hemoglobin: 14.3 g/dL (ref 13.0–17.0)
Lymphocytes Relative: 44 % (ref 12–46)
MCH: 24.9 pg — ABNORMAL LOW (ref 26.0–34.0)
MCHC: 33.6 g/dL (ref 30.0–36.0)
MCV: 74 fL — ABNORMAL LOW (ref 78.0–100.0)
Monocytes Absolute: 0.4 10*3/uL (ref 0.1–1.0)
Platelets: 183 10*3/uL (ref 150–400)
RDW: 24.5 % — ABNORMAL HIGH (ref 11.5–15.5)
WBC: 4.5 10*3/uL (ref 4.0–10.5)

## 2012-02-18 ENCOUNTER — Other Ambulatory Visit: Payer: Self-pay | Admitting: Internal Medicine

## 2012-03-03 ENCOUNTER — Other Ambulatory Visit (HOSPITAL_COMMUNITY): Payer: Self-pay | Admitting: Cardiology

## 2012-03-26 ENCOUNTER — Telehealth: Payer: Self-pay | Admitting: Cardiology

## 2012-03-26 NOTE — Telephone Encounter (Signed)
Walk In pt Form " Disability Pla-Card" Dropped Off Will Hold onto Until Debby/Wall Come Back Into Office 3/20   03/26/12/KM

## 2012-05-19 ENCOUNTER — Other Ambulatory Visit: Payer: Self-pay | Admitting: Internal Medicine

## 2012-06-23 ENCOUNTER — Ambulatory Visit: Payer: Medicare Other | Admitting: Cardiology

## 2012-06-28 ENCOUNTER — Other Ambulatory Visit: Payer: Self-pay | Admitting: Internal Medicine

## 2012-06-28 DIAGNOSIS — I10 Essential (primary) hypertension: Secondary | ICD-10-CM

## 2012-07-14 ENCOUNTER — Ambulatory Visit (INDEPENDENT_AMBULATORY_CARE_PROVIDER_SITE_OTHER): Payer: Medicare Other | Admitting: Cardiology

## 2012-07-14 ENCOUNTER — Encounter: Payer: Self-pay | Admitting: Cardiology

## 2012-07-14 VITALS — BP 126/68 | HR 71 | Ht 69.5 in | Wt 259.0 lb

## 2012-07-14 DIAGNOSIS — I209 Angina pectoris, unspecified: Secondary | ICD-10-CM

## 2012-07-14 DIAGNOSIS — E782 Mixed hyperlipidemia: Secondary | ICD-10-CM

## 2012-07-14 DIAGNOSIS — I10 Essential (primary) hypertension: Secondary | ICD-10-CM

## 2012-07-14 DIAGNOSIS — E669 Obesity, unspecified: Secondary | ICD-10-CM

## 2012-07-14 DIAGNOSIS — I739 Peripheral vascular disease, unspecified: Secondary | ICD-10-CM

## 2012-07-14 DIAGNOSIS — I25810 Atherosclerosis of coronary artery bypass graft(s) without angina pectoris: Secondary | ICD-10-CM

## 2012-07-14 NOTE — Patient Instructions (Addendum)
Your physician recommends that you continue on your current medications as directed. Please refer to the Current Medication list given to you today.  Your physician wants you to follow-up in: 1 year with Dr. Nelson. You will receive a reminder letter in the mail two months in advance. If you don't receive a letter, please call our office to schedule the follow-up appointment.  

## 2012-07-14 NOTE — Assessment & Plan Note (Addendum)
No history of this today. Continue secondary preventative therapy and exercise.

## 2012-07-14 NOTE — Progress Notes (Signed)
HPI William [name] returns today for evaluation and management his coronary artery disease. Is having no further angina. Last catheterization showed complex anatomy as outlined in [name] last note. Medical therapy was the best option.  He is exercising 3-4 days a week. He's lost about 20 pounds. He feels the best she's felt in some time.  Past Medical History  Diagnosis Date  . Hyperlipidemia   . Hypertension   . CAD (coronary artery disease)   . Goiter     removed  . Shoulder pain, left   . GERD (gastroesophageal reflux disease)   . Arthritis   . ED (erectile dysfunction)   . Myocardial infarction 1999  . Angina   . Sleep apnea, obstructive     uses BIPAP at night  . Previous back surgery     discs placed in cervical spine  . Rotator cuff syndrome   . Peripheral neuropathy   . Glaucoma     RT eye  . Blind right eye     Current Outpatient Prescriptions  Medication Sig Dispense Refill  . amLODipine (NORVASC) 10 MG tablet Take 1 tablet (10 mg total) by mouth daily.  30 tablet  11  . aspirin 325 MG tablet Take 325 mg by mouth daily.      . carvedilol (COREG) 12.5 MG tablet Take 1 tablet (12.5 mg total) by mouth 2 (two) times daily with a meal.  60 tablet  5  . ferrous sulfate 324 (65 FE) MG TBEC TAKE ONE TABLET BY MOUTH THREE TIMES DAILY  90 tablet  2  . isosorbide mononitrate (IMDUR) 30 MG 24 hr tablet Take 0.5 tablets (15 mg total) by mouth daily.  30 tablet  3  . lisinopril (PRINIVIL,ZESTRIL) 40 MG tablet TAKE ONE TABLET BY MOUTH ONE TIME DAILY  90 tablet  12  . Multiple Vitamin (MULITIVITAMIN WITH MINERALS) TABS Take 1 tablet by mouth daily.      . niacin 500 MG tablet Take 500 mg by mouth 2 (two) times daily. Take an 81 mg Aspirin 30 minutes before taking this medication to prevent a possible skin flushing reaction       . nitroGLYCERIN (NITROSTAT) 0.4 MG SL tablet Place 0.4 mg under the tongue every 5 (five) minutes. Up to 3 times as needed for chest pain       .  omeprazole (PRILOSEC) 20 MG capsule TAKE ONE CAPSULE BY MOUTH ONE TIME DAILY  30 capsule  4  . pravastatin (PRAVACHOL) 80 MG tablet TAKE  ONE TABLET BY MOUTH NIGHTLY AT BEDTIME  30 tablet  3  . timolol (TIMOPTIC) 0.5 % ophthalmic solution Place 1 drop into the right eye 2 (two) times daily.        No current facility-administered medications for this visit.    No Known Allergies  Family History  Problem Relation Age of Onset  . Heart attack Mother     Died at 65 with MI  . Heart attack Father     Died at unknown age with MI  . Heart disease Sister     CABG  . Heart attack Brother     History   Social History  . Marital Status: Legally Separated    Spouse Name: N/A    Number of Children: N/A  . Years of Education: N/A   Occupational History  . Not on file.   Social History Main Topics  . Smoking status: Never Smoker   . Smokeless tobacco: Not on file  .  Alcohol Use: Yes     Comment: once a month, a few at a time  . Drug Use: No     Comment: in past, 30 years ago  . Sexually Active: Yes   Other Topics Concern  . Not on file   Social History Narrative  . No narrative on file    ROS ALL NEGATIVE EXCEPT THOSE NOTED IN HPI  PE  General Appearance: well developed, well nourished in no acute distress, overweight HEENT: symmetrical face, PERRLA, good dentition  Neck: no JVD, thyromegaly, or adenopathy, trachea midline Chest: symmetric without deformity Cardiac: PMI non-displaced, RRR, normal S1, S2, no gallop or murmur Lung: clear to ausculation and percussion Vascular: all pulses full without bruits  Abdominal: nondistended, nontender, good bowel sounds, no HSM, no bruits Extremities: no cyanosis, clubbing or edema, no sign of DVT, no varicosities  Skin: normal color, no rashes Neuro: alert and oriented x 3, non-focal Pysch: normal affect  EKG  BMET    Component Value Date/Time   NA 137 10/23/2011 1105   K 4.2 10/23/2011 1105   CL 104 10/23/2011 1105    CO2 26 10/23/2011 1105   GLUCOSE 87 10/23/2011 1105   BUN 8 10/23/2011 1105   CREATININE 0.78 10/23/2011 1105   CREATININE 0.9 04/02/2011 1115   CALCIUM 8.6 10/23/2011 1105   GFRNONAA 88* 03/09/2011 0340   GFRAA >90 03/09/2011 0340    Lipid Panel     Component Value Date/Time   CHOL  Value: 159        ATP III CLASSIFICATION:  <200     mg/dL   Desirable  161-096  mg/dL   Borderline High  >=045    mg/dL   High        4/0/9811 0415   TRIG 105 02/13/2010 0415   HDL 32* 02/13/2010 0415   CHOLHDL 5.0 02/13/2010 0415   VLDL 21 02/13/2010 0415   LDLCALC  Value: 106        Total Cholesterol/HDL:CHD Risk Coronary Heart Disease Risk Table                     Men   Women  1/2 Average Risk   3.4   3.3  Average Risk       5.0   4.4  2 X Average Risk   9.6   7.1  3 X Average Risk  23.4   11.0        Use the calculated Patient Ratio above and the CHD Risk Table to determine the patient's CHD Risk.        ATP III CLASSIFICATION (LDL):  <100     mg/dL   Optimal  914-782  mg/dL   Near or Above                    Optimal  130-159  mg/dL   Borderline  956-213  mg/dL   High  >086     mg/dL   Very High* 05/20/8467 6295    CBC    Component Value Date/Time   WBC 4.5 02/11/2012 1516   RBC 5.74 02/11/2012 1516   HGB 14.3 02/11/2012 1516   HCT 42.5 02/11/2012 1516   PLT 183 02/11/2012 1516   MCV 74.0* 02/11/2012 1516   MCH 24.9* 02/11/2012 1516   MCHC 33.6 02/11/2012 1516   RDW 24.5* 02/11/2012 1516   LYMPHSABS 2.0 02/11/2012 1516   MONOABS 0.4 02/11/2012 1516   EOSABS 0.1 02/11/2012 1516  BASOSABS 0.0 02/11/2012 1516

## 2012-07-14 NOTE — Assessment & Plan Note (Signed)
Stable. Continue secondary preventative therapy. Patient doing much better with regular exercise and weight loss. Return the office in one year.

## 2012-07-29 ENCOUNTER — Other Ambulatory Visit: Payer: Self-pay | Admitting: Internal Medicine

## 2012-07-29 DIAGNOSIS — I10 Essential (primary) hypertension: Secondary | ICD-10-CM

## 2012-08-16 ENCOUNTER — Other Ambulatory Visit: Payer: Self-pay | Admitting: Internal Medicine

## 2012-11-18 ENCOUNTER — Other Ambulatory Visit: Payer: Self-pay | Admitting: Internal Medicine

## 2012-11-30 ENCOUNTER — Other Ambulatory Visit: Payer: Self-pay | Admitting: Internal Medicine

## 2012-12-01 ENCOUNTER — Telehealth: Payer: Self-pay | Admitting: *Deleted

## 2012-12-01 ENCOUNTER — Encounter: Payer: Self-pay | Admitting: Internal Medicine

## 2012-12-01 ENCOUNTER — Ambulatory Visit (INDEPENDENT_AMBULATORY_CARE_PROVIDER_SITE_OTHER): Payer: Medicare Other | Admitting: Internal Medicine

## 2012-12-01 VITALS — BP 131/81 | HR 79 | Temp 100.3°F | Ht 69.5 in | Wt 244.4 lb

## 2012-12-01 DIAGNOSIS — J069 Acute upper respiratory infection, unspecified: Secondary | ICD-10-CM

## 2012-12-01 MED ORDER — BENZONATATE 200 MG PO CAPS: 200.0000 mg | ORAL_CAPSULE | Freq: Three times a day (TID) | ORAL | Status: DC | PRN

## 2012-12-01 NOTE — Telephone Encounter (Signed)
Pt calls to say he needs to see doctor today for 2 days he has ?fever, sorethroat, achy and productive cough with pink sputum which he states is blood. Has not checked his temp but states he knows it has been up. appt set w/ dr Mariea Clonts at [phone] today

## 2012-12-01 NOTE — Telephone Encounter (Signed)
Agree. Thanks

## 2012-12-01 NOTE — Patient Instructions (Signed)
We will be prescribing Tessalon to help with your cough- 200mg  three times a day.  Rest, take lost of fluids and tylenol for the body pains.   Upper Respiratory Infection, Adult An upper respiratory infection (URI) is also sometimes known as the common cold. The upper respiratory tract includes the nose, sinuses, throat, trachea, and bronchi. Bronchi are the airways leading to the lungs. Most people improve within 1 week, but symptoms can last up to 2 weeks. A residual cough may last even longer.  CAUSES Many different viruses can infect the tissues lining the upper respiratory tract. The tissues become irritated and inflamed and often become very moist. Mucus production is also common. A cold is contagious. You can easily spread the virus to others by oral contact. This includes kissing, sharing a glass, coughing, or sneezing. Touching your mouth or nose and then touching a surface, which is then touched by another person, can also spread the virus. SYMPTOMS  Symptoms typically develop 1 to 3 days after you come in contact with a cold virus. Symptoms vary from person to person. They may include:  Runny nose.  Sneezing.  Nasal congestion.  Sinus irritation.  Sore throat.  Loss of voice (laryngitis).  Cough.  Fatigue.  Muscle aches.  Loss of appetite.  Headache.  Low-grade fever. DIAGNOSIS  You might diagnose your own cold based on familiar symptoms, since most people get a cold 2 to 3 times a year. Your caregiver can confirm this based on your exam. Most importantly, your caregiver can check that your symptoms are not due to another disease such as strep throat, sinusitis, pneumonia, asthma, or epiglottitis. Blood tests, throat tests, and X-rays are not necessary to diagnose a common cold, but they may sometimes be helpful in excluding other more serious diseases. Your caregiver will decide if any further tests are required. RISKS AND COMPLICATIONS  You may be at risk for a more  severe case of the common cold if you smoke cigarettes, have chronic heart disease (such as heart failure) or lung disease (such as asthma), or if you have a weakened immune system. The very young and very old are also at risk for more serious infections. Bacterial sinusitis, middle ear infections, and bacterial pneumonia can complicate the common cold. The common cold can worsen asthma and chronic obstructive pulmonary disease (COPD). Sometimes, these complications can require emergency medical care and may be life-threatening. PREVENTION  The best way to protect against getting a cold is to practice good hygiene. Avoid oral or hand contact with people with cold symptoms. Wash your hands often if contact occurs. There is no clear evidence that vitamin C, vitamin E, echinacea, or exercise reduces the chance of developing a cold. However, it is always recommended to get plenty of rest and practice good nutrition. TREATMENT  Treatment is directed at relieving symptoms. There is no cure. Antibiotics are not effective, because the infection is caused by a virus, not by bacteria. Treatment may include:  Increased fluid intake. Sports drinks offer valuable electrolytes, sugars, and fluids.  Breathing heated mist or steam (vaporizer or shower).  Eating chicken soup or other clear broths, and maintaining good nutrition.  Getting plenty of rest.  Using gargles or lozenges for comfort.  Controlling fevers with ibuprofen or acetaminophen as directed by your caregiver.  Increasing usage of your inhaler if you have asthma. Zinc gel and zinc lozenges, taken in the first 24 hours of the common cold, can shorten the duration and lessen the  severity of symptoms. Pain medicines may help with fever, muscle aches, and throat pain. A variety of non-prescription medicines are available to treat congestion and runny nose. Your caregiver can make recommendations and may suggest nasal or lung inhalers for other symptoms.    HOME CARE INSTRUCTIONS   Only take over-the-counter or prescription medicines for pain, discomfort, or fever as directed by your caregiver.  Use a warm mist humidifier or inhale steam from a shower to increase air moisture. This may keep secretions moist and make it easier to breathe.  Drink enough water and fluids to keep your urine clear or pale yellow.  Rest as needed.  Return to work when your temperature has returned to normal or as your caregiver advises. You may need to stay home longer to avoid infecting others. You can also use a face mask and careful hand washing to prevent spread of the virus. SEEK MEDICAL CARE IF:   After the first few days, you feel you are getting worse rather than better.  You need your caregiver's advice about medicines to control symptoms.  You develop chills, worsening shortness of breath, or brown or red sputum. These may be signs of pneumonia.  You develop yellow or brown nasal discharge or pain in the face, especially when you bend forward. These may be signs of sinusitis.  You develop a fever, swollen neck glands, pain with swallowing, or white areas in the back of your throat. These may be signs of strep throat. SEEK IMMEDIATE MEDICAL CARE IF:   You have a fever.  You develop severe or persistent headache, ear pain, sinus pain, or chest pain.  You develop wheezing, a prolonged cough, cough up blood, or have a change in your usual mucus (if you have chronic lung disease).  You develop sore muscles or a stiff neck.

## 2012-12-01 NOTE — Progress Notes (Signed)
Patient ID: William [name], male   DOB: 1945-10-28, 67 y.o.   MRN: [id number]   Subjective:   Patient ID: William [name] male   DOB: 1945-05-22 67 y.o.   MRN: [id number]  HPI: William [name] is a 67 y.o. with PMH of HTN, CAD, OSA, PUD, Thyroidectomy, HLD, Thyroidectomy.  Presented with complaints of sore throat, runny nose, aches, body pains and chills for the past 3 days. Patient also complained of distressing cough productive of pinkish sputum. No shortness of breath, temperature wasn't measured at home. Flu shot in late September. History of sick contacts- his nephew, had a really bad cold days before pts own symptoms started. No other complaints. Compliant with his medications. No Lightheadedness , dizziness or falls, no chest pains, or leg swelling.   Past Medical History  Diagnosis Date  . Hyperlipidemia   . Hypertension   . CAD (coronary artery disease)   . Goiter     removed  . Shoulder pain, left   . GERD (gastroesophageal reflux disease)   . Arthritis   . ED (erectile dysfunction)   . Myocardial infarction 1999  . Angina   . Sleep apnea, obstructive     uses BIPAP at night  . Previous back surgery     discs placed in cervical spine  . Rotator cuff syndrome   . Peripheral neuropathy   . Glaucoma     RT eye  . Blind right eye    Current Outpatient Prescriptions  Medication Sig Dispense Refill  . amLODipine (NORVASC) 10 MG tablet TAKE ONE TABLET BY MOUTH ONE TIME DAILY  30 tablet  10  . aspirin 325 MG tablet Take 325 mg by mouth daily.      . benzonatate (TESSALON) 200 MG capsule Take 1 capsule (200 mg total) by mouth 3 (three) times daily as needed for cough.  20 capsule  0  . carvedilol (COREG) 12.5 MG tablet TAKE ONE TABLET TWICE DAILY WITH MEALS  60 tablet  3  . ferrous sulfate 324 (65 FE) MG TBEC TAKE ONE TABLET BY MOUTH THREE TIMES DAILY  90 tablet  2  . isosorbide mononitrate (IMDUR) 30 MG 24 hr tablet Take 0.5 tablets (15 mg total) by mouth daily.  30 tablet   3  . lisinopril (PRINIVIL,ZESTRIL) 40 MG tablet TAKE ONE TABLET BY MOUTH ONE TIME DAILY  90 tablet  12  . Multiple Vitamin (MULITIVITAMIN WITH MINERALS) TABS Take 1 tablet by mouth daily.      . niacin 500 MG tablet Take 500 mg by mouth 2 (two) times daily. Take an 81 mg Aspirin 30 minutes before taking this medication to prevent a possible skin flushing reaction       . nitroGLYCERIN (NITROSTAT) 0.4 MG SL tablet Place 0.4 mg under the tongue every 5 (five) minutes. Up to 3 times as needed for chest pain       . omeprazole (PRILOSEC) 20 MG capsule TAKE ONE CAPSULE BY MOUTH ONE TIME DAILY  30 capsule  4  . pravastatin (PRAVACHOL) 80 MG tablet Take one tablet by mouth nightly at bedtime  30 tablet  6  . timolol (TIMOPTIC) 0.5 % ophthalmic solution Place 1 drop into the right eye 2 (two) times daily.        No current facility-administered medications for this visit.   Family History  Problem Relation Age of Onset  . Heart attack Mother     Died at 62 with MI  . Heart attack  Father     Died at unknown age with MI  . Heart disease Sister     CABG  . Heart attack Brother    History   Social History  . Marital Status: Legally Separated    Spouse Name: N/A    Number of Children: N/A  . Years of Education: N/A   Social History Main Topics  . Smoking status: Never Smoker   . Smokeless tobacco: None  . Alcohol Use: Yes     Comment: once a month, a few at a time  . Drug Use: No     Comment: in past, 30 years ago  . Sexual Activity: Yes   Other Topics Concern  . None   Social History Narrative  . None   Review of Systems: No pertinent findings on review of systems.  Objective:  Physical Exam: Filed Vitals:   12/01/12 1612  BP: 131/81  Pulse: 79  Temp: 100.3 F (37.9 C)  TempSrc: Oral  Height: 5' 9.5" (1.765 m)  Weight: 244 lb 6.4 oz (110.859 kg)  SpO2: 100%   GENERAL- alert, co-operative, appears as stated age, not in any distress. HEENT- Atraumatic, normocephalic,  PERRL, EOMI, oral mucosa appears moist, good and intact dentition,no erythema involving the tonsils or pharynx, no cervical LN enlargement. CARDIAC- RRR, no murmurs, rubs or gallops. RESP- Moving equal volumes of air, and clear to auscultation bilaterally, no added sounds ABDOMEN- Soft, non tender,no palpable masses or organomegaly, bowel sounds present. BACK- Normal curvature of the spine, No tenderness along the vertebrae, no CVA tenderness. NEURO- no obvious Cr N 2-12 abnormalities. EXTREMITIES- pulse 2+, symmetric. SKIN- Warm, dry, No rash or lesion. PSYCH- Normal mood and affect, appropriate thought content and speech.  Assessment & Plan:   The patient's case and plan of care was discussed with attending physician, Dr. Irine Seal.  Upper respiratory tract infection- of viral etiology. Considering the patient's complaints of- chills, aches, sore throat, runny nose, fever- 100.3 and cough. Also history of contact with sick persons. Will manage conservatively. Reassured patient that this is a viral condition and it will run its course and resolve. - Tessalon 200mg  TID. - Rest, Fluids, and tylenol for body pains. - Continue other meds for chronic med conditions.

## 2012-12-02 NOTE — Progress Notes (Signed)
I saw and evaluated the patient.  I personally confirmed the key portions of the history and exam documented by Dr. Emokpae and I reviewed pertinent patient test results.  The assessment, diagnosis, and plan were formulated together and I agree with the documentation in the resident's note. 

## 2013-03-15 ENCOUNTER — Other Ambulatory Visit: Payer: Self-pay | Admitting: *Deleted

## 2013-03-15 MED ORDER — PRAVASTATIN SODIUM 80 MG PO TABS: 80.0000 mg | ORAL_TABLET | Freq: Every day | ORAL | Status: DC

## 2013-04-28 DIAGNOSIS — E611 Iron deficiency: Secondary | ICD-10-CM | POA: Insufficient documentation

## 2013-06-01 ENCOUNTER — Other Ambulatory Visit: Payer: Self-pay | Admitting: Internal Medicine

## 2013-08-08 ENCOUNTER — Ambulatory Visit (INDEPENDENT_AMBULATORY_CARE_PROVIDER_SITE_OTHER): Payer: Medicare Other | Admitting: Cardiology

## 2013-08-08 ENCOUNTER — Encounter: Payer: Self-pay | Admitting: Cardiology

## 2013-08-08 VITALS — BP 138/72 | HR 82 | Ht 69.0 in | Wt 272.0 lb

## 2013-08-08 DIAGNOSIS — I209 Angina pectoris, unspecified: Secondary | ICD-10-CM

## 2013-08-08 DIAGNOSIS — E785 Hyperlipidemia, unspecified: Secondary | ICD-10-CM

## 2013-08-08 DIAGNOSIS — I739 Peripheral vascular disease, unspecified: Secondary | ICD-10-CM

## 2013-08-08 DIAGNOSIS — R079 Chest pain, unspecified: Secondary | ICD-10-CM

## 2013-08-08 DIAGNOSIS — I25810 Atherosclerosis of coronary artery bypass graft(s) without angina pectoris: Secondary | ICD-10-CM

## 2013-08-08 DIAGNOSIS — I25709 Atherosclerosis of coronary artery bypass graft(s), unspecified, with unspecified angina pectoris: Secondary | ICD-10-CM

## 2013-08-08 LAB — COMPREHENSIVE METABOLIC PANEL
ALT: 15 U/L (ref 0–53)
AST: 19 U/L (ref 0–37)
Albumin: 3.7 g/dL (ref 3.5–5.2)
Alkaline Phosphatase: 50 U/L (ref 39–117)
BUN: 11 mg/dL (ref 6–23)
CO2: 30 mEq/L (ref 19–32)
Calcium: 8.6 mg/dL (ref 8.4–10.5)
Chloride: 104 mEq/L (ref 96–112)
Creatinine, Ser: 0.9 mg/dL (ref 0.4–1.5)
GFR: 107.39 mL/min (ref >60.00)
Glucose, Bld: 85 mg/dL (ref 70–99)
Potassium: 3.8 mEq/L (ref 3.5–5.1)
Sodium: 137 mEq/L (ref 135–145)
Total Bilirubin: 0.6 mg/dL (ref 0.2–1.2)
Total Protein: 7.5 g/dL (ref 6.0–8.3)

## 2013-08-08 LAB — CBC WITH DIFFERENTIAL/PLATELET
Basophils Absolute: 0 10*3/uL (ref 0.0–0.1)
Basophils Relative: 0.5 % (ref 0.0–3.0)
Eosinophils Absolute: 0.2 10*3/uL (ref 0.0–0.7)
Eosinophils Relative: 4.3 % (ref 0.0–5.0)
HCT: 43.4 % (ref 39.0–52.0)
Hemoglobin: 14.6 g/dL (ref 13.0–17.0)
Lymphocytes Relative: 39 % (ref 12.0–46.0)
Lymphs Abs: 1.7 10*3/uL (ref 0.7–4.0)
MCHC: 33.6 g/dL (ref 30.0–36.0)
MCV: 84.2 fl (ref 78.0–100.0)
Monocytes Absolute: 0.4 10*3/uL (ref 0.1–1.0)
Monocytes Relative: 8.9 % (ref 3.0–12.0)
Neutro Abs: 2 10*3/uL (ref 1.4–7.7)
Neutrophils Relative %: 47.3 % (ref 43.0–77.0)
Platelets: 181 10*3/uL (ref 150.0–400.0)
RBC: 5.16 Mil/uL (ref 4.22–5.81)
RDW: 14 % (ref 11.5–15.5)
WBC: 4.3 10*3/uL (ref 4.0–10.5)

## 2013-08-08 MED ORDER — NITROGLYCERIN 0.4 MG SL SUBL: 0.4000 mg | SUBLINGUAL_TABLET | SUBLINGUAL | Status: AC

## 2013-08-08 NOTE — Patient Instructions (Addendum)
**Note De-Identified William [name] Obfuscation** Your physician has requested that you have en exercise stress myoview. For further information please visit [email or web address]. Please follow instruction sheet, as given.  Your physician has requested that you have a lower extremity arterial duplex. This test is an ultrasound of the arteries in the legs or arms. It looks at arterial blood flow in the legs and arms. Allow one hour for Lower and Upper Arterial scans. There are no restrictions or special instructions  Your physician recommends that you return for lab work in: On same day as Stress test (Please do not eat or drink after midnight the night before labs are drawn)  Your physician wants you to follow-up in: 1 year. You will receive a reminder letter in the mail two months in advance. If you don't receive a letter, please call our office to schedule the follow-up appointment.

## 2013-08-08 NOTE — Progress Notes (Signed)
Patient ID: PRICE LACHAPELLE, male   DOB: 1946-01-09, 68 y.o.   MRN: [id number]     Patient Name: William [name] Date of Encounter: 08/08/2013  Primary Care Provider:  Blain Pais, MD Primary Cardiologist:  Dorothy Spark, previously Dr Verl Blalock  Problem List   Past Medical History  Diagnosis Date  . Hyperlipidemia   . Hypertension   . CAD (coronary artery disease)   . Goiter     removed  . Shoulder pain, left   . GERD (gastroesophageal reflux disease)   . Arthritis   . ED (erectile dysfunction)   . Myocardial infarction 1999  . Angina   . Sleep apnea, obstructive     uses BIPAP at night  . Previous back surgery     discs placed in cervical spine  . Rotator cuff syndrome   . Peripheral neuropathy   . Glaucoma     RT eye  . Blind right eye    Past Surgical History  Procedure Laterality Date  . Coronary artery bypass graft  1999  . Spine surgery  10/09    C3-4, C4-5 diskectomy  . Eye surgery  08/09    Cataract Removal, bilateral  . Hernia repair      Inguinal Herniorhaphy  . Esophagogastroduodenoscopy  05/05/2011    Procedure: ESOPHAGOGASTRODUODENOSCOPY (EGD);  Surgeon: Lear Ng, MD;  Location: [facility] ENDOSCOPY;  Service: Endoscopy;  Laterality: N/A;  . Colonoscopy  05/05/2011    Procedure: COLONOSCOPY;  Surgeon: Lear Ng, MD;  Location: [facility] ENDOSCOPY;  Service: Endoscopy;  Laterality: N/A;    Allergies  No Known Allergies  HPI  Mr. Lipsky returns today for evaluation and management his coronary artery disease.   He has a history of CAD, status post CABG in 1999, preserved LV function, hypertension, hyperlipidemia, sleep apnea, GERD. He was admitted 2/24-2/26 2013 with chest pain. MI was ruled out. Patient also noted darker stools. Hemoglobin was 9.8 and MCV was 69. LHC 03/09/11: Distal left main 70%, ostial LAD occluded, proximal AV groove circumflex occluded, mid RCA occluded, LIMA-LAD patent, SVG-ramus intermedius patent, RIMA-distal RCA  75% at insertion and totally occluded small PDA and diffuse distal PLA disease, EF 45%. No culprit was identified for his pain and continued medical therapy was recommended. Followup echo 03/10/11: Mild LVH, EF 60-65%. Hemoglobin was 11.6 at discharge. Other labs: Potassium 3.6, creatinine 0.6. Chest x-ray was unremarkable. Patient was advised to follow up with his PCP for his anemia. He is exercising 3-4 days a week. He's lost about 20 pounds. He feels the best she's felt in some time.  08-08-1013 - He saw Dr. wall a year ago and reported improvement of chest pain after he lost 20 pounds. Since then he gained some weight and reports mild chest pressure on moderate exertion that happens approximately twice a month. It resolves when he stops. It is the same 80s claudication did appear moderate exertion and resolved at rest. No palpitations or syncope. No lower extremity edema, orthopnea or paroxysmal nocturnal dyspnea.  Home Medications  Prior to Admission medications   Medication Sig Start Date End Date Taking? Authorizing Provider  amLODipine (NORVASC) 10 MG tablet TAKE ONE TABLET BY MOUTH ONE TIME DAILY 11/18/12  Yes Blain Pais, MD  aspirin 325 MG tablet Take 81 mg by mouth daily.    Yes Historical Provider, MD  carvedilol (COREG) 12.5 MG tablet Take one tablet by mouth twice daily with meals 06/01/13  Yes Blain Pais, MD  ferrous sulfate 324 (65 FE) MG TBEC TAKE ONE TABLET BY MOUTH THREE TIMES DAILY 05/19/12  Yes Hadassah Pais, MD  lisinopril (PRINIVIL,ZESTRIL) 40 MG tablet TAKE ONE TABLET BY MOUTH ONE TIME DAILY 06/28/12  Yes Blain Pais, MD  Multiple Vitamin (MULITIVITAMIN WITH MINERALS) TABS Take 1 tablet by mouth daily.   Yes Historical Provider, MD  nitroGLYCERIN (NITROSTAT) 0.4 MG SL tablet Place 0.4 mg under the tongue every 5 (five) minutes. Up to 3 times as needed for chest pain    Yes Historical Provider, MD  pravastatin (PRAVACHOL) 80 MG tablet Take 1 tablet (80 mg  total) by mouth daily. 03/15/13  Yes Blain Pais, MD    Family History  Family History  Problem Relation Age of Onset  . Heart attack Mother     Died at 52 with MI  . Heart attack Father     Died at unknown age with MI  . Heart disease Sister     CABG  . Heart attack Brother     Social History  History   Social History  . Marital Status: Legally Separated    Spouse Name: N/A    Number of Children: N/A  . Years of Education: N/A   Occupational History  . Not on file.   Social History Main Topics  . Smoking status: Never Smoker   . Smokeless tobacco: Not on file  . Alcohol Use: Yes     Comment: once a month, a few at a time  . Drug Use: No     Comment: in past, 30 years ago  . Sexual Activity: Yes   Other Topics Concern  . Not on file   Social History Narrative  . No narrative on file     Review of Systems, as per HPI, otherwise negative General:  No chills, fever, night sweats or weight changes.  Cardiovascular:  No chest pain, dyspnea on exertion, edema, orthopnea, palpitations, paroxysmal nocturnal dyspnea. Dermatological: No rash, lesions/masses Respiratory: No cough, dyspnea Urologic: No hematuria, dysuria Abdominal:   No nausea, vomiting, diarrhea, bright red blood per rectum, melena, or hematemesis Neurologic:  No visual changes, wkns, changes in mental status. All other systems reviewed and are otherwise negative except as noted above.  Physical Exam  Blood pressure 138/72, pulse 82, height 5\' 9"  (1.753 m), weight 272 lb (123.378 kg).  General: Pleasant, NAD Psych: Normal affect. Neuro: Alert and oriented X 3. Moves all extremities spontaneously. HEENT: Normal  Neck: Supple without bruits or JVD. Lungs:  Resp regular and unlabored, CTA. Heart: RRR no s3, s4, or murmurs. Abdomen: Soft, non-tender, non-distended, BS + x 4.  Extremities: No clubbing, cyanosis or edema. DP/PT/Radials 2+ and equal bilaterally.  Labs:  No results found for  this basename: CKTOTAL, CKMB, TROPONINI,  in the last 72 hours Lab Results  Component Value Date   WBC 4.5 02/11/2012   HGB 14.3 02/11/2012   HCT 42.5 02/11/2012   MCV 74.0* 02/11/2012   PLT 183 02/11/2012    Lab Results  Component Value Date   DDIMER  Value: <0.22        AT THE INHOUSE ESTABLISHED CUTOFF VALUE OF 0.48 ug/mL FEU, THIS ASSAY HAS BEEN DOCUMENTED IN THE LITERATURE TO HAVE 11/27/2006   No components found with this basename: POCBNP,     Component Value Date/Time   NA 137 10/23/2011 1105   K 4.2 10/23/2011 1105   CL 104 10/23/2011 1105   CO2 26 10/23/2011 1105  GLUCOSE 87 10/23/2011 1105   BUN 8 10/23/2011 1105   CREATININE 0.78 10/23/2011 1105   CREATININE 0.9 04/02/2011 1115   CALCIUM 8.6 10/23/2011 1105   PROT 7.1 02/12/2010 1737   ALBUMIN 3.5 02/12/2010 1737   AST 18 02/12/2010 1737   ALT 16 02/12/2010 1737   ALKPHOS 57 02/12/2010 1737   BILITOT 0.4 02/12/2010 1737   GFRNONAA >89 10/23/2011 1105   GFRNONAA 88* 03/09/2011 0340   GFRAA >89 10/23/2011 1105   GFRAA >90 03/09/2011 0340   Lab Results  Component Value Date   CHOL  Value: 159        ATP III CLASSIFICATION:  <200     mg/dL   Desirable  200-239  mg/dL   Borderline High  >=240    mg/dL   High        02/13/2010   HDL 32* 02/13/2010   LDLCALC  Value: 106        Total Cholesterol/HDL:CHD Risk Coronary Heart Disease Risk Table                     Men   Women  1/2 Average Risk   3.4   3.3  Average Risk       5.0   4.4  2 X Average Risk   9.6   7.1  3 X Average Risk  23.4   11.0        Use the calculated Patient Ratio above and the CHD Risk Table to determine the patient's CHD Risk.        ATP III CLASSIFICATION (LDL):  <100     mg/dL   Optimal  100-129  mg/dL   Near or Above                    Optimal  130-159  mg/dL   Borderline  160-189  mg/dL   High  >190     mg/dL   Very High* 02/13/2010   TRIG 105 02/13/2010    Accessory Clinical Findings  echocardiogram  ECG - sinus rhythm, normal EKG.    Assessment & Plan  1.  Coronary Artery Disease  As noted, anatomy was stable at cardiac catheterization and no culprit was identified for his symptoms.  New angina, we will order an exercise nuclear stress test to rule out ischemia. We will prescribe him sublingual nitroglycerin that he is currently not taking.  2. chronic combined systolic and diastolic heart failure - currently euvolemic.   3. HYPERTENSION  - Controlled   4. Claudication - we will order bilateral lower extremity arterial duplex  5. HYPERLIPIDEMIA TYPE IIB / III - check lipids today.   Followup in 1 year considering all results normal.  Dorothy Spark, MD, [facility] 08/08/2013, 9:59 AM

## 2013-08-10 ENCOUNTER — Other Ambulatory Visit: Payer: Self-pay | Admitting: Internal Medicine

## 2013-08-17 ENCOUNTER — Ambulatory Visit (HOSPITAL_BASED_OUTPATIENT_CLINIC_OR_DEPARTMENT_OTHER): Payer: Medicare Other | Admitting: Radiology

## 2013-08-17 ENCOUNTER — Other Ambulatory Visit (INDEPENDENT_AMBULATORY_CARE_PROVIDER_SITE_OTHER): Payer: Medicare Other

## 2013-08-17 ENCOUNTER — Ambulatory Visit (HOSPITAL_COMMUNITY): Payer: Medicare Other | Attending: Cardiovascular Disease | Admitting: Cardiology

## 2013-08-17 VITALS — BP 143/86 | HR 71 | Ht 69.0 in | Wt 280.0 lb

## 2013-08-17 DIAGNOSIS — E785 Hyperlipidemia, unspecified: Secondary | ICD-10-CM | POA: Insufficient documentation

## 2013-08-17 DIAGNOSIS — I10 Essential (primary) hypertension: Secondary | ICD-10-CM | POA: Insufficient documentation

## 2013-08-17 DIAGNOSIS — I739 Peripheral vascular disease, unspecified: Secondary | ICD-10-CM | POA: Diagnosis not present

## 2013-08-17 DIAGNOSIS — I25709 Atherosclerosis of coronary artery bypass graft(s), unspecified, with unspecified angina pectoris: Secondary | ICD-10-CM

## 2013-08-17 DIAGNOSIS — R202 Paresthesia of skin: Secondary | ICD-10-CM

## 2013-08-17 DIAGNOSIS — R079 Chest pain, unspecified: Secondary | ICD-10-CM

## 2013-08-17 DIAGNOSIS — I2510 Atherosclerotic heart disease of native coronary artery without angina pectoris: Secondary | ICD-10-CM

## 2013-08-17 DIAGNOSIS — R52 Pain, unspecified: Secondary | ICD-10-CM

## 2013-08-17 LAB — LIPID PANEL
Cholesterol: 141 mg/dL (ref 0–200)
HDL: 37.1 mg/dL — ABNORMAL LOW (ref >39.00)
LDL Cholesterol: 92 mg/dL (ref 0–99)
NonHDL: 103.9
Total CHOL/HDL Ratio: 4
Triglycerides: 62 mg/dL (ref 0.0–149.0)
VLDL: 12.4 mg/dL (ref 0.0–40.0)

## 2013-08-17 MED ORDER — REGADENOSON 0.4 MG/5ML IV SOLN
0.4000 mg | Freq: Once | INTRAVENOUS | Status: AC
  Administered 2013-08-17: 0.4 mg via INTRAVENOUS

## 2013-08-17 MED ORDER — TECHNETIUM TC 99M SESTAMIBI GENERIC - CARDIOLITE
33.0000 | Freq: Once | INTRAVENOUS | Status: AC | PRN
  Administered 2013-08-17: 33 via INTRAVENOUS

## 2013-08-17 MED ORDER — TECHNETIUM TC 99M SESTAMIBI GENERIC - CARDIOLITE
11.0000 | Freq: Once | INTRAVENOUS | Status: AC | PRN
  Administered 2013-08-17: 11 via INTRAVENOUS

## 2013-08-17 NOTE — Progress Notes (Signed)
Lower arterial doppler performed. 

## 2013-08-17 NOTE — Progress Notes (Signed)
[facility] NUCLEAR [facility] [address] [place], [place] [address] [phone]    Cardiology Nuclear Med Study  William [name] is a 68 y.o. male     MRN : [id number]     DOB: 1945/08/20  Procedure Date: 08/17/2013  Nuclear Med Background Indication for Stress Test:  Evaluation for Ischemia and Graft Patency History:  CAD, MI, CABG, Echo 2013 EF 60-65%, MPI 2009 EF 55% Cardiac Risk Factors: Family History - CAD, Hypertension and Lipids  Symptoms:  Chest Pain   Nuclear Pre-Procedure Caffeine/Decaff Intake:  None NPO After: 8:00pm   Lungs:  clear O2 Sat: 95% on room air. IV 0.9% NS with Angio Cath:  22g  IV Site: R Hand  IV Started by:  Crissie Figures, RN  Chest Size (in):  50+ Cup Size: n/a  Height: 5\' 9"  (1.753 m)  Weight:  280 lb (127.007 kg)  BMI:  Body mass index is 41.33 kg/(m^2). Tech Comments:  N/A    Nuclear Med Study 1 or 2 day study: 1 day  Stress Test Type:  Lexiscan  Reading MD: N/A  Order Authorizing Provider:  Ena Dawley, MD  Resting Radionuclide: Technetium 15m Sestamibi  Resting Radionuclide Dose: 11.0 mCi   Stress Radionuclide:  Technetium 39m Sestamibi  Stress Radionuclide Dose: 33.0 mCi           Stress Protocol Rest HR: 71 Stress HR: 84  Rest BP: 143/86 Stress BP: 144/79  Exercise Time (min): n/a METS: n/a           Dose of Adenosine (mg):  n/a Dose of Lexiscan: 0.4 mg  Dose of Atropine (mg): n/a Dose of Dobutamine: n/a mcg/kg/min (at max HR)  Stress Test Technologist: Glade Lloyd, BS-ES  Nuclear Technologist:  Vedia Pereyra, CNMT     Rest Procedure:  Myocardial perfusion imaging was performed at rest 45 minutes following the intravenous administration of Technetium 76m Sestamibi. Rest ECG: NSR - Normal EKG  Stress Procedure:  The patient received IV Lexiscan 0.4 mg over 15-seconds.  Technetium 27m Sestamibi injected at 30-seconds.  Quantitative spect images were obtained after a 45 minute delay.  During the infusion of  Lexiscan the patient complained of SOB, stomach discomfort and dizziness.  These symptoms subsided in recovery.  Stress ECG: No significant change from baseline ECG  QPS Raw Data Images:  Normal; no motion artifact; normal heart/lung ratio. Stress Images:  There is a medium-sized, moderate basal inferolateral and basal/mid/apical inferior perfusion defect.  Rest Images:  Normal homogeneous uptake in all areas of the myocardium. Subtraction (SDS):  Reversible basal inferolateral and basal/mid/apical inferior perfusion defect.  Transient Ischemic Dilatation (Normal <1.22):  1.15 Lung/Heart Ratio (Normal <0.45):  0.36  Quantitative Gated Spect Images QGS EDV:  106 ml QGS ESV:  53 ml  Impression Exercise Capacity:  Lexiscan with no exercise. BP Response:  Normal blood pressure response. Clinical Symptoms:  Abdominal discomfort ECG Impression:  No significant ST segment change suggestive of ischemia. Comparison with Prior Nuclear Study: Prior study reported as normal.   Overall Impression:  Intermediate risk stress nuclear study with a medium-sized reversible basal inferolateral and basal/mid/apical inferior perfusion defect..  This is suggestive of ischemia.   LV Ejection Fraction: 50%.  LV Wall Motion:  NL LV Function; NL Wall Motion  Loralie Champagne 08/17/2013

## 2013-08-18 ENCOUNTER — Telehealth: Payer: Self-pay | Admitting: *Deleted

## 2013-08-18 ENCOUNTER — Encounter: Payer: Self-pay | Admitting: *Deleted

## 2013-08-18 DIAGNOSIS — D509 Iron deficiency anemia, unspecified: Secondary | ICD-10-CM

## 2013-08-18 DIAGNOSIS — I739 Peripheral vascular disease, unspecified: Secondary | ICD-10-CM

## 2013-08-18 DIAGNOSIS — R9439 Abnormal result of other cardiovascular function study: Secondary | ICD-10-CM

## 2013-08-18 DIAGNOSIS — I25709 Atherosclerosis of coronary artery bypass graft(s), unspecified, with unspecified angina pectoris: Secondary | ICD-10-CM

## 2013-08-18 DIAGNOSIS — E782 Mixed hyperlipidemia: Secondary | ICD-10-CM

## 2013-08-18 DIAGNOSIS — G4733 Obstructive sleep apnea (adult) (pediatric): Secondary | ICD-10-CM

## 2013-08-18 DIAGNOSIS — I998 Other disorder of circulatory system: Secondary | ICD-10-CM

## 2013-08-18 DIAGNOSIS — R0602 Shortness of breath: Secondary | ICD-10-CM

## 2013-08-18 DIAGNOSIS — I10 Essential (primary) hypertension: Secondary | ICD-10-CM

## 2013-08-18 NOTE — Addendum Note (Signed)
Addended by: Dorothy Spark on: 08/18/2013 05:01 PM   Modules accepted: Orders

## 2013-08-18 NOTE — Telephone Encounter (Signed)
Message copied by Nuala Alpha on Thu Aug 18, 2013  3:51 PM ------      Message from: Dorothy Spark      Created: Thu Aug 18, 2013 11:59 AM       Normal lower extremity US, however ischemia on stress test, he will need a cath. ------

## 2013-08-18 NOTE — Telephone Encounter (Signed)
Pt contacted [place] back for results.  Informed pt that his lower extremity US was normal, but his stress test was positive showing ischemia. Informed pt that he needs a left heart cath per Dr Meda Coffee.  Scheduled pt for cardiac cath for next Wednesday August 24, 2013 at 0830 at the [facility].  Cath will be performed by Dr Claiborne Billings.  Instructed pt to arrive at 0630 at the [facility].  Instructed pt to come in for pre-procedure labs tomorrow 8/7 at our office (PT, PTT, BMP, CBC W DIFF).  Informed pt that I will have him letter of instruction to pick-up at the front desk when he comes in for his labs.  Will inform Dr Meda Coffee to put hospital orders in.  Pt verbalized understanding and agrees with this plan.

## 2013-08-18 NOTE — Telephone Encounter (Signed)
LMTCB in regards to pts normal lower extremity US, but stress test revealed ischemia, and per Dr Meda Coffee he will need a catheterization done.  Pt had labs done on 7/27, that revealed normal.  Pt will still need to have them repeated including PT, PTT.  Left detailed message for pt to contact us before 5pm today, and he cannot reach [place], he is to call back tomorrow and ask for a triage nurse to go over his stress test results and to assist in setting the pt up for a cath.

## 2013-08-19 ENCOUNTER — Other Ambulatory Visit (INDEPENDENT_AMBULATORY_CARE_PROVIDER_SITE_OTHER): Payer: Medicare Other

## 2013-08-19 DIAGNOSIS — R0602 Shortness of breath: Secondary | ICD-10-CM

## 2013-08-19 DIAGNOSIS — I25709 Atherosclerosis of coronary artery bypass graft(s), unspecified, with unspecified angina pectoris: Secondary | ICD-10-CM

## 2013-08-19 DIAGNOSIS — I739 Peripheral vascular disease, unspecified: Secondary | ICD-10-CM

## 2013-08-19 DIAGNOSIS — I999 Unspecified disorder of circulatory system: Secondary | ICD-10-CM

## 2013-08-19 DIAGNOSIS — I998 Other disorder of circulatory system: Secondary | ICD-10-CM

## 2013-08-19 DIAGNOSIS — I209 Angina pectoris, unspecified: Secondary | ICD-10-CM

## 2013-08-19 DIAGNOSIS — E782 Mixed hyperlipidemia: Secondary | ICD-10-CM

## 2013-08-19 DIAGNOSIS — I25810 Atherosclerosis of coronary artery bypass graft(s) without angina pectoris: Secondary | ICD-10-CM

## 2013-08-19 DIAGNOSIS — I10 Essential (primary) hypertension: Secondary | ICD-10-CM

## 2013-08-19 DIAGNOSIS — R9439 Abnormal result of other cardiovascular function study: Secondary | ICD-10-CM

## 2013-08-19 DIAGNOSIS — G4733 Obstructive sleep apnea (adult) (pediatric): Secondary | ICD-10-CM

## 2013-08-19 DIAGNOSIS — D509 Iron deficiency anemia, unspecified: Secondary | ICD-10-CM

## 2013-08-19 LAB — BASIC METABOLIC PANEL
BUN: 10 mg/dL (ref 6–23)
CO2: 29 mEq/L (ref 19–32)
Calcium: 8.8 mg/dL (ref 8.4–10.5)
Chloride: 103 mEq/L (ref 96–112)
Creatinine, Ser: 0.9 mg/dL (ref 0.4–1.5)
GFR: 104.69 mL/min (ref >60.00)
Glucose, Bld: 97 mg/dL (ref 70–99)
Potassium: 3.8 mEq/L (ref 3.5–5.1)
Sodium: 136 mEq/L (ref 135–145)

## 2013-08-19 LAB — APTT: aPTT: 36.7 s — ABNORMAL HIGH (ref 23.4–32.7)

## 2013-08-19 LAB — CBC WITH DIFFERENTIAL/PLATELET
Basophils Absolute: 0 10*3/uL (ref 0.0–0.1)
Basophils Relative: 0.6 % (ref 0.0–3.0)
Eosinophils Absolute: 0.2 10*3/uL (ref 0.0–0.7)
Eosinophils Relative: 4.4 % (ref 0.0–5.0)
HCT: 45.2 % (ref 39.0–52.0)
Hemoglobin: 15.1 g/dL (ref 13.0–17.0)
Lymphocytes Relative: 40.4 % (ref 12.0–46.0)
Lymphs Abs: 1.8 10*3/uL (ref 0.7–4.0)
MCHC: 33.5 g/dL (ref 30.0–36.0)
MCV: 84.9 fl (ref 78.0–100.0)
Monocytes Absolute: 0.4 10*3/uL (ref 0.1–1.0)
Monocytes Relative: 9.7 % (ref 3.0–12.0)
Neutro Abs: 2 10*3/uL (ref 1.4–7.7)
Neutrophils Relative %: 44.9 % (ref 43.0–77.0)
Platelets: 185 10*3/uL (ref 150.0–400.0)
RBC: 5.32 Mil/uL (ref 4.22–5.81)
RDW: 14.4 % (ref 11.5–15.5)
WBC: 4.4 10*3/uL (ref 4.0–10.5)

## 2013-08-19 LAB — PROTIME-INR
INR: 1 ratio (ref 0.8–1.0)
Prothrombin Time: 11.2 s (ref 9.6–13.1)

## 2013-08-24 ENCOUNTER — Encounter (HOSPITAL_COMMUNITY)
Admission: RE | Disposition: A | Discharge status: Home / Self Care | Payer: Self-pay | Source: Ambulatory Visit | Attending: Cardiovascular Disease

## 2013-08-24 ENCOUNTER — Ambulatory Visit (HOSPITAL_COMMUNITY)
Admission: RE | Admit: 2013-08-24 | Discharge: 2013-08-24 | Disposition: A | Discharge status: Home / Self Care | Payer: Medicare Other | Source: Ambulatory Visit | Attending: Cardiovascular Disease | Admitting: Cardiovascular Disease

## 2013-08-24 ENCOUNTER — Telehealth: Payer: Self-pay | Admitting: *Deleted

## 2013-08-24 DIAGNOSIS — Z79899 Other long term (current) drug therapy: Secondary | ICD-10-CM | POA: Insufficient documentation

## 2013-08-24 DIAGNOSIS — R079 Chest pain, unspecified: Secondary | ICD-10-CM

## 2013-08-24 DIAGNOSIS — G609 Hereditary and idiopathic neuropathy, unspecified: Secondary | ICD-10-CM | POA: Diagnosis not present

## 2013-08-24 DIAGNOSIS — I509 Heart failure, unspecified: Secondary | ICD-10-CM | POA: Diagnosis not present

## 2013-08-24 DIAGNOSIS — I252 Old myocardial infarction: Secondary | ICD-10-CM | POA: Insufficient documentation

## 2013-08-24 DIAGNOSIS — N529 Male erectile dysfunction, unspecified: Secondary | ICD-10-CM | POA: Diagnosis not present

## 2013-08-24 DIAGNOSIS — I10 Essential (primary) hypertension: Secondary | ICD-10-CM | POA: Diagnosis not present

## 2013-08-24 DIAGNOSIS — I6529 Occlusion and stenosis of unspecified carotid artery: Secondary | ICD-10-CM | POA: Diagnosis not present

## 2013-08-24 DIAGNOSIS — K219 Gastro-esophageal reflux disease without esophagitis: Secondary | ICD-10-CM | POA: Diagnosis not present

## 2013-08-24 DIAGNOSIS — E785 Hyperlipidemia, unspecified: Secondary | ICD-10-CM | POA: Insufficient documentation

## 2013-08-24 DIAGNOSIS — G4733 Obstructive sleep apnea (adult) (pediatric): Secondary | ICD-10-CM | POA: Insufficient documentation

## 2013-08-24 DIAGNOSIS — I25810 Atherosclerosis of coronary artery bypass graft(s) without angina pectoris: Secondary | ICD-10-CM | POA: Diagnosis not present

## 2013-08-24 DIAGNOSIS — Z7982 Long term (current) use of aspirin: Secondary | ICD-10-CM | POA: Insufficient documentation

## 2013-08-24 DIAGNOSIS — I2510 Atherosclerotic heart disease of native coronary artery without angina pectoris: Secondary | ICD-10-CM | POA: Diagnosis not present

## 2013-08-24 DIAGNOSIS — Z9889 Other specified postprocedural states: Secondary | ICD-10-CM | POA: Diagnosis not present

## 2013-08-24 DIAGNOSIS — I5042 Chronic combined systolic (congestive) and diastolic (congestive) heart failure: Secondary | ICD-10-CM | POA: Insufficient documentation

## 2013-08-24 DIAGNOSIS — Z9849 Cataract extraction status, unspecified eye: Secondary | ICD-10-CM | POA: Diagnosis not present

## 2013-08-24 DIAGNOSIS — R9439 Abnormal result of other cardiovascular function study: Secondary | ICD-10-CM | POA: Diagnosis present

## 2013-08-24 DIAGNOSIS — I25709 Atherosclerosis of coronary artery bypass graft(s), unspecified, with unspecified angina pectoris: Secondary | ICD-10-CM

## 2013-08-24 HISTORY — PX: LEFT HEART CATHETERIZATION WITH CORONARY ANGIOGRAM: SHX5451

## 2013-08-24 SURGERY — LEFT HEART CATHETERIZATION WITH CORONARY ANGIOGRAM
Anesthesia: LOCAL

## 2013-08-24 MED ORDER — MIDAZOLAM HCL 2 MG/2ML IJ SOLN
INTRAMUSCULAR | Status: AC
  Filled 2013-08-24: qty 2

## 2013-08-24 MED ORDER — MIDAZOLAM HCL 2 MG/2ML IJ SOLN
INTRAMUSCULAR | Status: AC
Start: 2013-08-24 — End: 2013-08-24
  Filled 2013-08-24: qty 2

## 2013-08-24 MED ORDER — ACETAMINOPHEN 325 MG PO TABS
650.0000 mg | ORAL_TABLET | ORAL | Status: DC | PRN
  Administered 2013-08-24: 650 mg via ORAL

## 2013-08-24 MED ORDER — SODIUM CHLORIDE 0.9 % IJ SOLN: 3.0000 mL | INTRAMUSCULAR | Status: DC | PRN

## 2013-08-24 MED ORDER — ASPIRIN EC 81 MG PO TBEC: 81.0000 mg | DELAYED_RELEASE_TABLET | Freq: Every day | ORAL | Status: DC

## 2013-08-24 MED ORDER — ACETAMINOPHEN 325 MG PO TABS
ORAL_TABLET | ORAL | Status: AC
  Administered 2013-08-24: 650 mg via ORAL
  Filled 2013-08-24: qty 2

## 2013-08-24 MED ORDER — LIDOCAINE HCL (PF) 1 % IJ SOLN
INTRAMUSCULAR | Status: AC
  Filled 2013-08-24: qty 30

## 2013-08-24 MED ORDER — SODIUM CHLORIDE 0.9 % IV SOLN: INTRAVENOUS | Status: DC

## 2013-08-24 MED ORDER — SODIUM CHLORIDE 0.9 % IJ SOLN: 3.0000 mL | Freq: Two times a day (BID) | INTRAMUSCULAR | Status: DC

## 2013-08-24 MED ORDER — ONDANSETRON HCL 4 MG/2ML IJ SOLN: 4.0000 mg | Freq: Four times a day (QID) | INTRAMUSCULAR | Status: DC | PRN

## 2013-08-24 MED ORDER — ISOSORBIDE MONONITRATE ER 30 MG PO TB24: 30.0000 mg | ORAL_TABLET | Freq: Every day | ORAL | Status: DC

## 2013-08-24 MED ORDER — SODIUM CHLORIDE 0.9 % IV SOLN: 250.0000 mL | INTRAVENOUS | Status: DC | PRN

## 2013-08-24 MED ORDER — HEPARIN (PORCINE) IN NACL 2-0.9 UNIT/ML-% IJ SOLN
INTRAMUSCULAR | Status: AC
Start: 2013-08-24 — End: 2013-08-24
  Filled 2013-08-24: qty 1000

## 2013-08-24 MED ORDER — ASPIRIN 81 MG PO CHEW: 81.0000 mg | CHEWABLE_TABLET | ORAL | Status: DC

## 2013-08-24 MED ORDER — SODIUM CHLORIDE 0.9 % IV SOLN
INTRAVENOUS | Status: DC
  Administered 2013-08-24: 06:00:00 via INTRAVENOUS

## 2013-08-24 MED ORDER — NITROGLYCERIN 0.2 MG/ML ON CALL CATH LAB
INTRAVENOUS | Status: AC
Start: 2013-08-24 — End: 2013-08-24
  Filled 2013-08-24: qty 1

## 2013-08-24 MED ORDER — FENTANYL CITRATE 0.05 MG/ML IJ SOLN
INTRAMUSCULAR | Status: AC
  Filled 2013-08-24: qty 2

## 2013-08-24 NOTE — Progress Notes (Signed)
Right femoral arterial sheath removed by Joycelyn Rua

## 2013-08-24 NOTE — Telephone Encounter (Signed)
Message copied by Nuala Alpha on Wed Aug 24, 2013 12:54 PM ------      Message from: Dorothy Spark      Created: Wed Aug 24, 2013 12:18 PM       Could you call his pharmacy and prescribe him imdur 30 mg po daily?      Thank you,      K ------

## 2013-08-24 NOTE — Telephone Encounter (Signed)
Sent pts Imdur 30 mg po daily to pts pharmacy [facility] in [place], per Dr Meda Coffee.

## 2013-08-24 NOTE — H&P (View-Only) (Signed)
Patient ID: William [name], male   DOB: Jun 22, 1945, 68 y.o.   MRN: [id number]     Patient Name: William [name] Date of Encounter: 08/08/2013  Primary Care Provider:  Blain Pais, MD Primary Cardiologist:  William [name], previously William [name]  Problem List   Past Medical History  Diagnosis Date  . Hyperlipidemia   . Hypertension   . CAD (coronary artery disease)   . Goiter     removed  . Shoulder pain, left   . GERD (gastroesophageal reflux disease)   . Arthritis   . ED (erectile dysfunction)   . Myocardial infarction 1999  . Angina   . Sleep apnea, obstructive     uses BIPAP at night  . Previous back surgery     discs placed in cervical spine  . Rotator cuff syndrome   . Peripheral neuropathy   . Glaucoma     RT eye  . Blind right eye    Past Surgical History  Procedure Laterality Date  . Coronary artery bypass graft  1999  . Spine surgery  10/09    C3-4, C4-5 diskectomy  . Eye surgery  08/09    Cataract Removal, bilateral  . Hernia repair      Inguinal Herniorhaphy  . Esophagogastroduodenoscopy  05/05/2011    Procedure: ESOPHAGOGASTRODUODENOSCOPY (EGD);  Surgeon: William Ng, MD;  Location: [facility] ENDOSCOPY;  Service: Endoscopy;  Laterality: N/A;  . Colonoscopy  05/05/2011    Procedure: COLONOSCOPY;  Surgeon: William Ng, MD;  Location: [facility] ENDOSCOPY;  Service: Endoscopy;  Laterality: N/A;    Allergies  No Known Allergies  HPI  Mr. Ferger returns today for evaluation and management his coronary artery disease.   He has a history of CAD, status post CABG in 1999, preserved LV function, hypertension, hyperlipidemia, sleep apnea, GERD. He was admitted 2/24-2/26 2013 with chest pain. MI was ruled out. Patient also noted darker stools. Hemoglobin was 9.8 and MCV was 69. LHC 03/09/11: Distal left main 70%, ostial LAD occluded, proximal AV groove circumflex occluded, mid RCA occluded, LIMA-LAD patent, SVG-ramus intermedius patent, RIMA-distal RCA  75% at insertion and totally occluded small PDA and diffuse distal PLA disease, EF 45%. No culprit was identified for his pain and continued medical therapy was recommended. Followup echo 03/10/11: Mild LVH, EF 60-65%. Hemoglobin was 11.6 at discharge. Other labs: Potassium 3.6, creatinine 0.6. Chest x-ray was unremarkable. Patient was advised to follow up with his PCP for his anemia. He is exercising 3-4 days a week. He's lost about 20 pounds. He feels the best she's felt in some time.  08-08-1013 - He saw William. wall a year ago and reported improvement of chest pain after he lost 20 pounds. Since then he gained some weight and reports mild chest pressure on moderate exertion that happens approximately twice a month. It resolves when he stops. It is the same 80s claudication did appear moderate exertion and resolved at rest. No palpitations or syncope. No lower extremity edema, orthopnea or paroxysmal nocturnal dyspnea.  Home Medications  Prior to Admission medications   Medication Sig Start Date End Date Taking? Authorizing Provider  amLODipine (NORVASC) 10 MG tablet TAKE ONE TABLET BY MOUTH ONE TIME DAILY 11/18/12  Yes William Pais, MD  aspirin 325 MG tablet Take 81 mg by mouth daily.    Yes Historical Provider, MD  carvedilol (COREG) 12.5 MG tablet Take one tablet by mouth twice daily with meals 06/01/13  Yes William Pais, MD  ferrous sulfate 324 (65 FE) MG TBEC TAKE ONE TABLET BY MOUTH THREE TIMES DAILY 05/19/12  Yes William Pais, MD  lisinopril (PRINIVIL,ZESTRIL) 40 MG tablet TAKE ONE TABLET BY MOUTH ONE TIME DAILY 06/28/12  Yes William Pais, MD  Multiple Vitamin (MULITIVITAMIN WITH MINERALS) TABS Take 1 tablet by mouth daily.   Yes Historical Provider, MD  nitroGLYCERIN (NITROSTAT) 0.4 MG SL tablet Place 0.4 mg under the tongue every 5 (five) minutes. Up to 3 times as needed for chest pain    Yes Historical Provider, MD  pravastatin (PRAVACHOL) 80 MG tablet Take 1 tablet (80 mg  total) by mouth daily. 03/15/13  Yes William Pais, MD    Family History  Family History  Problem Relation Age of Onset  . Heart attack Mother     Died at 11 with MI  . Heart attack Father     Died at unknown age with MI  . Heart disease Sister     CABG  . Heart attack Brother     Social History  History   Social History  . Marital Status: Legally Separated    Spouse Name: N/A    Number of Children: N/A  . Years of Education: N/A   Occupational History  . Not on file.   Social History Main Topics  . Smoking status: Never Smoker   . Smokeless tobacco: Not on file  . Alcohol Use: Yes     Comment: once a month, a few at a time  . Drug Use: No     Comment: in past, 30 years ago  . Sexual Activity: Yes   Other Topics Concern  . Not on file   Social History Narrative  . No narrative on file     Review of Systems, as per HPI, otherwise negative General:  No chills, fever, night sweats or weight changes.  Cardiovascular:  No chest pain, dyspnea on exertion, edema, orthopnea, palpitations, paroxysmal nocturnal dyspnea. Dermatological: No rash, lesions/masses Respiratory: No cough, dyspnea Urologic: No hematuria, dysuria Abdominal:   No nausea, vomiting, diarrhea, bright red blood per rectum, melena, or hematemesis Neurologic:  No visual changes, wkns, changes in mental status. All other systems reviewed and are otherwise negative except as noted above.  Physical Exam  Blood pressure 138/72, pulse 82, height 5\' 9"  (1.753 m), weight 272 lb (123.378 kg).  General: Pleasant, NAD Psych: Normal affect. Neuro: Alert and oriented X 3. Moves all extremities spontaneously. HEENT: Normal  Neck: Supple without bruits or JVD. Lungs:  Resp regular and unlabored, CTA. Heart: RRR no s3, s4, or murmurs. Abdomen: Soft, non-tender, non-distended, BS + x 4.  Extremities: No clubbing, cyanosis or edema. DP/PT/Radials 2+ and equal bilaterally.  Labs:  No results found for  this basename: CKTOTAL, CKMB, TROPONINI,  in the last 72 hours Lab Results  Component Value Date   WBC 4.5 02/11/2012   HGB 14.3 02/11/2012   HCT 42.5 02/11/2012   MCV 74.0* 02/11/2012   PLT 183 02/11/2012    Lab Results  Component Value Date   DDIMER  Value: <0.22        AT THE INHOUSE ESTABLISHED CUTOFF VALUE OF 0.48 ug/mL FEU, THIS ASSAY HAS BEEN DOCUMENTED IN THE LITERATURE TO HAVE 11/27/2006   No components found with this basename: POCBNP,     Component Value Date/Time   NA 137 10/23/2011 1105   K 4.2 10/23/2011 1105   CL 104 10/23/2011 1105   CO2 26 10/23/2011 1105  GLUCOSE 87 10/23/2011 1105   BUN 8 10/23/2011 1105   CREATININE 0.78 10/23/2011 1105   CREATININE 0.9 04/02/2011 1115   CALCIUM 8.6 10/23/2011 1105   PROT 7.1 02/12/2010 1737   ALBUMIN 3.5 02/12/2010 1737   AST 18 02/12/2010 1737   ALT 16 02/12/2010 1737   ALKPHOS 57 02/12/2010 1737   BILITOT 0.4 02/12/2010 1737   GFRNONAA >89 10/23/2011 1105   GFRNONAA 88* 03/09/2011 0340   GFRAA >89 10/23/2011 1105   GFRAA >90 03/09/2011 0340   Lab Results  Component Value Date   CHOL  Value: 159        ATP III CLASSIFICATION:  <200     mg/dL   Desirable  200-239  mg/dL   Borderline High  >=240    mg/dL   High        02/13/2010   HDL 32* 02/13/2010   LDLCALC  Value: 106        Total Cholesterol/HDL:CHD Risk Coronary Heart Disease Risk Table                     Men   Women  1/2 Average Risk   3.4   3.3  Average Risk       5.0   4.4  2 X Average Risk   9.6   7.1  3 X Average Risk  23.4   11.0        Use the calculated Patient Ratio above and the CHD Risk Table to determine the patient's CHD Risk.        ATP III CLASSIFICATION (LDL):  <100     mg/dL   Optimal  100-129  mg/dL   Near or Above                    Optimal  130-159  mg/dL   Borderline  160-189  mg/dL   High  >190     mg/dL   Very High* 02/13/2010   TRIG 105 02/13/2010    Accessory Clinical Findings  echocardiogram  ECG - sinus rhythm, normal EKG.    Assessment & Plan  1.  Coronary Artery Disease  As noted, anatomy was stable at cardiac catheterization and no culprit was identified for his symptoms.  New angina, we will order an exercise nuclear stress test to rule out ischemia. We will prescribe him sublingual nitroglycerin that he is currently not taking.  2. chronic combined systolic and diastolic heart failure - currently euvolemic.   3. HYPERTENSION  - Controlled   4. Claudication - we will order bilateral lower extremity arterial duplex  5. HYPERLIPIDEMIA TYPE IIB / III - check lipids today.   Followup in 1 year considering all results normal.  William Spark, MD, [facility] 08/08/2013, 9:59 AM

## 2013-08-24 NOTE — CV Procedure (Signed)
William [name] is a 68 y.o. male    [id number]  [id number] LOCATION:  FACILITY: [facility]  PHYSICIAN: Troy Sine, MD, [facility] [birth year]   DATE OF PROCEDURE:  08/24/2013    CARDIAC CATHETERIZATION     HISTORY:    William [name] is a 68 y.o. male who is referred by Dr. Meda Coffee for cardiac catheterization following an abnormal nuclear study.  The patient underwent CABG surgery in 1999 with LIMA to the LAD, a vein graft, which sequentially pills and intermediate and OM 2 vessel, and the RIMA graft to the RCA.  He also has peripheral vascular disease.  A recent nuclear study suggested ischemia inferolaterally.  He is referred for definitive repeat cardiac catheterization.   PROCEDURE: Left heart catheterization: native coronary angiography, angiography of saphenous vein graft, LIMA, and RIMA grafts, left ventriculography  The patient was brought to the [facility] cardiac catherization labaratory in the postabsorptive state. He was premedicated with Versed 2 mg and fentanyl 50 mcg. His Her right groin was prepped and shaved in usual sterile fashion. Xylocaine 1% was used for local anesthesia. A 5 French sheath was inserted into the R femoral artery. Diagnostic catheterizatiion was done with 5 [place] LF4, FR4,LIMA and pigtail catheters. Left ventriculography was done with 26 cc Omnipaque contrast. Hemostasis was obtained by direct manual compression. The patient tolerated the procedure well.   HEMODYNAMICS:   Central Aorta: 137/79   Left Ventricle: 137/14  ANGIOGRAPHY:  Fluoroscopy reveals significant coronary calcification.  Left main: Moderate size vessel that had 70% distal stenosis and bifurcated into an occluded LAD and circumflex system  LAD: Total occlusion immediately beyond the ostium  Left circumflex: Total occlusion proximally  Right coronary artery: Total occlusion in the mid artery after small anterior marginal branch   LIMA to LAD: Patent and anastomose it to the mid  LAD; visualization was suboptimal but there did not appear to be significant disease.  SVG to sequentially to an intermediate and obtuse marginal vessel. The vein graft had diffuse plaque.  There was 50% stenosis.  There was significant irregularity  with moderate plaque without high grade stenosis within the graft. The intermediate vessel is small caliber and patent.  There was a focal 90% stenosis in the OM 2 vessel proximal to its distal bifurcation.  RIMA to distal RCA: Very difficult to access and not able to selectively cannulate.  Of note, there did appear to be an 80% near ostial stenosis of the left carotid arising from the innominate.  The RIMA graft itself appeared patent.  The patient was previously noted to have a totally occluded small PDA and diffuse distal PLA disease.  This was poorly visualized.   Left ventriculography revealed an ejection fraction of approximately 50%.  There was mild mid distal inferior hypocontractility.    IMPRESSION:  Severe native coronary obstructive disease with 70% distal left main stenosis; total occlusion of the LAD near the ostium, total proximal circumflex occlusion, and total proximal to mid RCA occlusion.  Patent LIMA to LAD.  Sequential SVG to the ramus intermediate and OM 2 vessel with diffuse irregularity within the graft with narrowings of at least 50% and evidence for moderate plaque within the graft and evidence for a 90% focal stenosis in a small OM 2 vessel proximal to its distal bifurcation.  Poorly visualized, but patent RIMA graft to a diffusely diseased distal RCA system.  Probable 80% focal right proximal carotid stenosis immediately after arising from the innominate artery.  RECOMMENDATION:  The angiographic findings were reviewed and discussed with Dr. Meda Coffee, The patient's primary cardiologist.  The patient has not had significant accelerated anginal symptoms.  His nuclear study does show ischemia, most likely due to his OM   2. Stenosis.  However, the SVG which supplies this vessel  is diffusely irregular with moderate plaque and luminal narrowing throughout its course.  There is a risk for potential distal embolization with a 75 year old graft.  In light of the patient's current symptom status, increased medical therapy will initially be recommended with the addition of nitrates added to his current regimen and possibly the addition of ranolazine if symptoms progress.  If he continues to have progressive symptomatology, consideration for PCI to the OM 2 vessel via the diffusely diseased SVG may need to be undertaken, but with some increased potential distal embolization risk.   Troy Sine, MD, [facility] 08/24/2013 12:30 PM

## 2013-08-24 NOTE — Interval H&P Note (Signed)
Cath Lab Visit (complete for each Cath Lab visit)  Clinical Evaluation Leading to the Procedure:   ACS: No.  Non-ACS:    Anginal Classification: CCS III  Anti-ischemic medical therapy: Maximal Therapy (2 or more classes of medications)  Non-Invasive Test Results: Intermediate-risk stress test findings: cardiac mortality 1-3%/year  Prior CABG: Previous CABG      History and Physical Interval Note:  08/24/2013 11:02 AM  Denman George  has presented today for surgery, with the diagnosis of positive stress test  The various methods of treatment have been discussed with the patient and family. After consideration of risks, benefits and other options for treatment, the patient has consented to  Procedure(s): LEFT HEART CATHETERIZATION WITH CORONARY ANGIOGRAM (N/A) as a surgical intervention .  The patient's history has been reviewed, patient examined, no change in status, stable for surgery.  I have reviewed the patient's chart and labs.  Questions were answered to the patient's satisfaction.     KELLY,THOMAS A

## 2013-08-24 NOTE — Discharge Instructions (Signed)

## 2013-08-24 NOTE — Progress Notes (Signed)
Site area: right groin Site Prior to Removal:  Level 0 Pressure Applied For: 20 minutes by Rayford Halsted Manual:   yes Patient Status During Pull:  stable Post Pull Site:  Level 0 Post Pull Instructions Given:  yes Post Pull Pulses Present: yes Dressing Applied:  Tegaderm Bedrest begins @ 13:00:00 Comments: No complications

## 2013-08-25 ENCOUNTER — Encounter: Payer: Self-pay | Admitting: Cardiovascular Disease

## 2013-08-25 ENCOUNTER — Telehealth: Payer: Self-pay | Admitting: Cardiology

## 2013-08-25 NOTE — Telephone Encounter (Signed)
I spoke with the pt and his wife and made him aware of Rx for Isosorbide MN.  I gave the pt dosage instructions, reason for taking medication and possible side effects.

## 2013-08-25 NOTE — Telephone Encounter (Signed)
New message     Pharmacy told pt we called in a presc for him.  He want to know what is the medication and what is it for?

## 2013-09-02 ENCOUNTER — Other Ambulatory Visit: Payer: Self-pay | Admitting: Internal Medicine

## 2013-10-25 ENCOUNTER — Encounter: Payer: Self-pay | Admitting: Pulmonary Disease

## 2013-10-25 ENCOUNTER — Encounter: Payer: Self-pay | Admitting: Cardiology

## 2013-10-31 ENCOUNTER — Other Ambulatory Visit: Payer: Self-pay | Admitting: Internal Medicine

## 2013-11-22 ENCOUNTER — Other Ambulatory Visit: Payer: Self-pay | Admitting: Internal Medicine

## 2013-12-22 ENCOUNTER — Encounter (HOSPITAL_COMMUNITY): Payer: Self-pay | Admitting: Cardiovascular Disease

## 2014-01-02 ENCOUNTER — Other Ambulatory Visit: Payer: Self-pay | Admitting: Internal Medicine

## 2014-01-23 ENCOUNTER — Other Ambulatory Visit: Payer: Self-pay | Admitting: Internal Medicine

## 2014-06-16 DIAGNOSIS — E780 Pure hypercholesterolemia: Secondary | ICD-10-CM | POA: Diagnosis not present

## 2014-06-16 DIAGNOSIS — M1612 Unilateral primary osteoarthritis, left hip: Secondary | ICD-10-CM | POA: Diagnosis not present

## 2014-06-16 DIAGNOSIS — M25552 Pain in left hip: Secondary | ICD-10-CM | POA: Diagnosis not present

## 2014-06-16 DIAGNOSIS — I10 Essential (primary) hypertension: Secondary | ICD-10-CM | POA: Diagnosis not present

## 2014-06-16 DIAGNOSIS — M2550 Pain in unspecified joint: Secondary | ICD-10-CM | POA: Diagnosis not present

## 2014-06-16 DIAGNOSIS — Z0001 Encounter for general adult medical examination with abnormal findings: Secondary | ICD-10-CM | POA: Diagnosis not present

## 2014-06-30 DIAGNOSIS — I10 Essential (primary) hypertension: Secondary | ICD-10-CM | POA: Diagnosis not present

## 2014-06-30 DIAGNOSIS — G4733 Obstructive sleep apnea (adult) (pediatric): Secondary | ICD-10-CM | POA: Diagnosis not present

## 2014-06-30 DIAGNOSIS — E780 Pure hypercholesterolemia: Secondary | ICD-10-CM | POA: Diagnosis not present

## 2014-06-30 DIAGNOSIS — I25810 Atherosclerosis of coronary artery bypass graft(s) without angina pectoris: Secondary | ICD-10-CM | POA: Diagnosis not present

## 2014-08-16 DIAGNOSIS — M169 Osteoarthritis of hip, unspecified: Secondary | ICD-10-CM | POA: Insufficient documentation

## 2015-03-19 ENCOUNTER — Ambulatory Visit: Payer: Self-pay | Admitting: Sports Medicine

## 2015-03-19 ENCOUNTER — Ambulatory Visit (INDEPENDENT_AMBULATORY_CARE_PROVIDER_SITE_OTHER): Payer: Medicare Other | Admitting: Sports Medicine

## 2015-03-19 ENCOUNTER — Encounter: Payer: Self-pay | Admitting: Sports Medicine

## 2015-03-19 VITALS — BP 155/88 | Ht 70.0 in | Wt 280.0 lb

## 2015-03-19 DIAGNOSIS — M1612 Unilateral primary osteoarthritis, left hip: Secondary | ICD-10-CM

## 2015-03-19 MED ORDER — TRAMADOL HCL 50 MG PO TABS
ORAL_TABLET | ORAL | Status: AC
Start: 2015-03-19 — End: ?

## 2015-03-19 MED ORDER — KETOROLAC TROMETHAMINE 60 MG/2ML IM SOLN
60.0000 mg | Freq: Once | INTRAMUSCULAR | Status: AC
  Administered 2015-03-19: 60 mg via INTRAMUSCULAR

## 2015-03-19 MED ORDER — METHYLPREDNISOLONE ACETATE 80 MG/ML IJ SUSP
80.0000 mg | Freq: Once | INTRAMUSCULAR | Status: AC
  Administered 2015-03-19: 80 mg via INTRAMUSCULAR

## 2015-03-19 NOTE — Progress Notes (Signed)
   Subjective:    Patient ID: William [name]., male    DOB: February 04, 1945, 70 y.o.   MRN: [id number]  HPI chief complaint: Left hip pain  70 year old gentleman comes in today complaining of left hip pain. No trauma but an insidious onset of pain that he localizes primarily to the groin. Pain will radiate at times down to his knee. He has had fluoroscopically guided intra-articular cortisone injections into this same hip in the past with good results. These were done at the [facility] in [place]. Pain has been worsening over the past month. He has taken some over-the-counter Aleve with minimal symptom relief. No associated numbness or tingling. Symptoms improve at rest.  Interm medical history reviewed Medications reviewed Allergies reviewed     Review of Systems    As above  Objective:   Physical Exam  Obese. No acute distress  Left hip: Limited passive external and internal rotation. This does reproduce groin pain. Pain with resisted hip flexion. No tenderness over the greater trochanter bursa. Neurovascularly intact distally.  X-rays of his pelvis shows mild degenerative changes in both hips      Assessment & Plan:   Left hip pain secondary to DJD  Patient already has an appointment set up for a repeat intra-articular cortisone injection at the [facility] later this week. Given his good success with previous injections done at that site, I've recommended that he keep that appointment for a repeat injection. I did agree to inject him with 80 mg of Depo-Medrol IM and 60 mg of Toradol IM to hopefully ease up some of his pain until he can be seen later this week. I've also given him a prescription for tramadol to take 1-2 50 mg tablets twice a day as needed. Follow-up with me as needed.

## 2015-07-12 DIAGNOSIS — M1612 Unilateral primary osteoarthritis, left hip: Secondary | ICD-10-CM | POA: Insufficient documentation

## 2015-07-15 DIAGNOSIS — E871 Hypo-osmolality and hyponatremia: Secondary | ICD-10-CM | POA: Insufficient documentation

## 2016-05-10 ENCOUNTER — Emergency Department: Payer: Medicare Other

## 2016-05-10 ENCOUNTER — Encounter: Payer: Self-pay | Admitting: Emergency Medicine

## 2016-05-10 ENCOUNTER — Inpatient Hospital Stay
Admission: EM | Admit: 2016-05-10 | Discharge: 2016-05-13 | DRG: 378 | Disposition: A | Discharge status: Home / Self Care | Payer: Medicare Other | Attending: Internal Medicine | Admitting: Internal Medicine

## 2016-05-10 DIAGNOSIS — Z7982 Long term (current) use of aspirin: Secondary | ICD-10-CM

## 2016-05-10 DIAGNOSIS — R0789 Other chest pain: Secondary | ICD-10-CM | POA: Diagnosis present

## 2016-05-10 DIAGNOSIS — D509 Iron deficiency anemia, unspecified: Secondary | ICD-10-CM | POA: Diagnosis present

## 2016-05-10 DIAGNOSIS — K3189 Other diseases of stomach and duodenum: Secondary | ICD-10-CM | POA: Diagnosis present

## 2016-05-10 DIAGNOSIS — E785 Hyperlipidemia, unspecified: Secondary | ICD-10-CM | POA: Diagnosis present

## 2016-05-10 DIAGNOSIS — D62 Acute posthemorrhagic anemia: Secondary | ICD-10-CM | POA: Diagnosis not present

## 2016-05-10 DIAGNOSIS — I10 Essential (primary) hypertension: Secondary | ICD-10-CM | POA: Diagnosis present

## 2016-05-10 DIAGNOSIS — K640 First degree hemorrhoids: Secondary | ICD-10-CM | POA: Diagnosis present

## 2016-05-10 DIAGNOSIS — G629 Polyneuropathy, unspecified: Secondary | ICD-10-CM | POA: Diagnosis not present

## 2016-05-10 DIAGNOSIS — D125 Benign neoplasm of sigmoid colon: Secondary | ICD-10-CM | POA: Diagnosis not present

## 2016-05-10 DIAGNOSIS — K317 Polyp of stomach and duodenum: Secondary | ICD-10-CM | POA: Diagnosis present

## 2016-05-10 DIAGNOSIS — Z6836 Body mass index (BMI) 36.0-36.9, adult: Secondary | ICD-10-CM

## 2016-05-10 DIAGNOSIS — K2980 Duodenitis without bleeding: Secondary | ICD-10-CM | POA: Diagnosis not present

## 2016-05-10 DIAGNOSIS — Z951 Presence of aortocoronary bypass graft: Secondary | ICD-10-CM | POA: Diagnosis not present

## 2016-05-10 DIAGNOSIS — I2510 Atherosclerotic heart disease of native coronary artery without angina pectoris: Secondary | ICD-10-CM

## 2016-05-10 DIAGNOSIS — Z8249 Family history of ischemic heart disease and other diseases of the circulatory system: Secondary | ICD-10-CM

## 2016-05-10 DIAGNOSIS — D649 Anemia, unspecified: Secondary | ICD-10-CM

## 2016-05-10 DIAGNOSIS — R079 Chest pain, unspecified: Secondary | ICD-10-CM

## 2016-05-10 DIAGNOSIS — K922 Gastrointestinal hemorrhage, unspecified: Secondary | ICD-10-CM

## 2016-05-10 DIAGNOSIS — I252 Old myocardial infarction: Secondary | ICD-10-CM | POA: Diagnosis not present

## 2016-05-10 DIAGNOSIS — I200 Unstable angina: Secondary | ICD-10-CM | POA: Diagnosis present

## 2016-05-10 DIAGNOSIS — E669 Obesity, unspecified: Secondary | ICD-10-CM | POA: Diagnosis not present

## 2016-05-10 DIAGNOSIS — K921 Melena: Secondary | ICD-10-CM | POA: Diagnosis not present

## 2016-05-10 DIAGNOSIS — Z79899 Other long term (current) drug therapy: Secondary | ICD-10-CM

## 2016-05-10 DIAGNOSIS — R195 Other fecal abnormalities: Secondary | ICD-10-CM

## 2016-05-10 DIAGNOSIS — K219 Gastro-esophageal reflux disease without esophagitis: Secondary | ICD-10-CM | POA: Diagnosis present

## 2016-05-10 DIAGNOSIS — H5461 Unqualified visual loss, right eye, normal vision left eye: Secondary | ICD-10-CM | POA: Diagnosis present

## 2016-05-10 HISTORY — DX: Presence of aortocoronary bypass graft: Z95.1

## 2016-05-10 LAB — BASIC METABOLIC PANEL
ANION GAP: 5 (ref 5–15)
BUN: 13 mg/dL (ref 6–20)
CHLORIDE: 105 mmol/L (ref 101–111)
CO2: 25 mmol/L (ref 22–32)
Calcium: 8.6 mg/dL — ABNORMAL LOW (ref 8.9–10.3)
Creatinine, Ser: 0.85 mg/dL (ref 0.61–1.24)
GFR calc Af Amer: >60 mL/min (ref >60)
Glucose, Bld: 109 mg/dL — ABNORMAL HIGH (ref 65–99)
POTASSIUM: 3.9 mmol/L (ref 3.5–5.1)
SODIUM: 135 mmol/L (ref 135–145)

## 2016-05-10 LAB — CBC
HEMATOCRIT: 28.1 % — AB (ref 40.0–52.0)
HEMOGLOBIN: 9.1 g/dL — AB (ref 13.0–18.0)
MCH: 23 pg — ABNORMAL LOW (ref 26.0–34.0)
MCHC: 32.3 g/dL (ref 32.0–36.0)
MCV: 71.3 fL — AB (ref 80.0–100.0)
Platelets: 234 10*3/uL (ref 150–440)
RBC: 3.94 MIL/uL — ABNORMAL LOW (ref 4.40–5.90)
RDW: 15.6 % — AB (ref 11.5–14.5)
WBC: 3.6 10*3/uL — AB (ref 3.8–10.6)

## 2016-05-10 LAB — TROPONIN I
TROPONIN I: 0.03 ng/mL — AB (ref <0.03)
Troponin I: <0.03 ng/mL (ref <0.03)

## 2016-05-10 LAB — ABO/RH: ABO/RH(D): O POS

## 2016-05-10 LAB — PREPARE RBC (CROSSMATCH)

## 2016-05-10 MED ORDER — NITROGLYCERIN 0.4 MG SL SUBL
0.4000 mg | SUBLINGUAL_TABLET | SUBLINGUAL | Status: DC | PRN
  Administered 2016-05-10: 0.4 mg via SUBLINGUAL
  Filled 2016-05-10: qty 1

## 2016-05-10 MED ORDER — ASPIRIN EC 81 MG PO TBEC
81.0000 mg | DELAYED_RELEASE_TABLET | Freq: Every day | ORAL | Status: DC
  Administered 2016-05-11 – 2016-05-13 (×3): 81 mg via ORAL
  Filled 2016-05-10 (×3): qty 1

## 2016-05-10 MED ORDER — CARVEDILOL 12.5 MG PO TABS
12.5000 mg | ORAL_TABLET | Freq: Two times a day (BID) | ORAL | Status: DC
  Administered 2016-05-11 – 2016-05-12 (×4): 12.5 mg via ORAL
  Filled 2016-05-10 (×5): qty 1

## 2016-05-10 MED ORDER — ACETAMINOPHEN 325 MG PO TABS
650.0000 mg | ORAL_TABLET | Freq: Four times a day (QID) | ORAL | Status: DC | PRN
Start: 2016-05-10 — End: 2016-05-13
  Administered 2016-05-11 (×2): 650 mg via ORAL
  Filled 2016-05-10 (×2): qty 2

## 2016-05-10 MED ORDER — MORPHINE SULFATE (PF) 2 MG/ML IV SOLN: 2.0000 mg | INTRAVENOUS | Status: DC | PRN

## 2016-05-10 MED ORDER — ACETAMINOPHEN 500 MG PO TABS
1000.0000 mg | ORAL_TABLET | Freq: Once | ORAL | Status: AC
  Administered 2016-05-10: 1000 mg via ORAL
  Filled 2016-05-10: qty 2

## 2016-05-10 MED ORDER — ONDANSETRON HCL 4 MG/2ML IJ SOLN: 4.0000 mg | Freq: Four times a day (QID) | INTRAMUSCULAR | Status: DC | PRN

## 2016-05-10 MED ORDER — AMLODIPINE BESYLATE 10 MG PO TABS
10.0000 mg | ORAL_TABLET | Freq: Every day | ORAL | Status: DC
  Administered 2016-05-11 – 2016-05-13 (×3): 10 mg via ORAL
  Filled 2016-05-10 (×3): qty 1

## 2016-05-10 MED ORDER — ONDANSETRON HCL 4 MG PO TABS: 4.0000 mg | ORAL_TABLET | Freq: Four times a day (QID) | ORAL | Status: DC | PRN

## 2016-05-10 MED ORDER — ISOSORBIDE MONONITRATE ER 60 MG PO TB24
60.0000 mg | ORAL_TABLET | Freq: Every day | ORAL | Status: DC
  Administered 2016-05-11 – 2016-05-13 (×3): 60 mg via ORAL
  Filled 2016-05-10 (×3): qty 1

## 2016-05-10 MED ORDER — ATORVASTATIN CALCIUM 20 MG PO TABS
80.0000 mg | ORAL_TABLET | Freq: Every day | ORAL | Status: DC
  Administered 2016-05-11 – 2016-05-12 (×2): 80 mg via ORAL
  Filled 2016-05-10 (×2): qty 4

## 2016-05-10 MED ORDER — FAMOTIDINE IN NACL 20-0.9 MG/50ML-% IV SOLN: 20.0000 mg | Freq: Two times a day (BID) | INTRAVENOUS | Status: DC

## 2016-05-10 MED ORDER — ASPIRIN 81 MG PO CHEW
324.0000 mg | CHEWABLE_TABLET | Freq: Once | ORAL | Status: AC
  Administered 2016-05-10: 324 mg via ORAL
  Filled 2016-05-10: qty 4

## 2016-05-10 MED ORDER — ACETAMINOPHEN 650 MG RE SUPP
650.0000 mg | Freq: Four times a day (QID) | RECTAL | Status: DC | PRN
Start: 2016-05-10 — End: 2016-05-13

## 2016-05-10 MED ORDER — NITROGLYCERIN 0.4 MG SL SUBL: 0.4000 mg | SUBLINGUAL_TABLET | SUBLINGUAL | Status: DC | PRN

## 2016-05-10 MED ORDER — BISACODYL 10 MG RE SUPP: 10.0000 mg | Freq: Every day | RECTAL | Status: DC | PRN

## 2016-05-10 MED ORDER — PANTOPRAZOLE SODIUM 40 MG IV SOLR
40.0000 mg | Freq: Two times a day (BID) | INTRAVENOUS | Status: DC
  Administered 2016-05-10 – 2016-05-13 (×6): 40 mg via INTRAVENOUS
  Filled 2016-05-10 (×6): qty 40

## 2016-05-10 MED ORDER — FERROUS SULFATE 325 (65 FE) MG PO TABS
325.0000 mg | ORAL_TABLET | Freq: Every day | ORAL | Status: DC
  Administered 2016-05-11: 325 mg via ORAL
  Filled 2016-05-10: qty 1

## 2016-05-10 MED ORDER — ALUM & MAG HYDROXIDE-SIMETH 200-200-20 MG/5ML PO SUSP: 30.0000 mL | ORAL | Status: DC | PRN

## 2016-05-10 MED ORDER — SODIUM CHLORIDE 0.9 % IV SOLN: 10.0000 mL/h | Freq: Once | INTRAVENOUS | Status: DC

## 2016-05-10 MED ORDER — DOCUSATE SODIUM 100 MG PO CAPS
100.0000 mg | ORAL_CAPSULE | Freq: Two times a day (BID) | ORAL | Status: DC
  Administered 2016-05-10 – 2016-05-12 (×4): 100 mg via ORAL
  Filled 2016-05-10 (×5): qty 1

## 2016-05-10 MED ORDER — SODIUM CHLORIDE 0.9% FLUSH
3.0000 mL | Freq: Two times a day (BID) | INTRAVENOUS | Status: DC
  Administered 2016-05-10 – 2016-05-13 (×5): 3 mL via INTRAVENOUS

## 2016-05-10 MED ORDER — SODIUM CHLORIDE 0.9 % IV BOLUS (SEPSIS)
500.0000 mL | Freq: Once | INTRAVENOUS | Status: AC
  Administered 2016-05-10: 500 mL via INTRAVENOUS

## 2016-05-10 MED ORDER — LISINOPRIL 20 MG PO TABS
40.0000 mg | ORAL_TABLET | Freq: Every day | ORAL | Status: DC
  Administered 2016-05-11 – 2016-05-13 (×3): 40 mg via ORAL
  Filled 2016-05-10 (×2): qty 2

## 2016-05-10 NOTE — H&P (Signed)
History and Physical    William [name]. [id number] DOB: Feb 09, 1945 DOA: 05/10/2016  Referring physician: Dr. Alfred Levins PCP: Kirk Ruths., MD  Specialists: Dr. Ubaldo Glassing  Chief Complaint: chest pain  HPI: William Stenseth. is a 71 y.o. male has a past medical history significant for ASCVD s/p CABG now with recurrent CP at rest radiating to right arm. Pain improved in ER with SL NTG. Found to be anemic with guaiac positive stools. He  Is now admitted. Currently pain-free. Has been having associated SOB, sweats, and nause with his CP.  Review of Systems: The patient denies anorexia, fever, weight loss,, vision loss, decreased hearing, hoarseness, syncope,  peripheral edema, balance deficits, hemoptysis, abdominal pain, melena, hematochezia, severe indigestion/heartburn, hematuria, incontinence, genital sores, muscle weakness, suspicious skin lesions, transient blindness, difficulty walking, depression, unusual weight change, abnormal bleeding, enlarged lymph nodes, angioedema, and breast masses.   Past Medical History:  Diagnosis Date  . Angina   . Arthritis   . Blind right eye   . CAD (coronary artery disease)   . ED (erectile dysfunction)   . GERD (gastroesophageal reflux disease)   . Glaucoma    RT eye  . Goiter    removed  . Hyperlipidemia   . Hypertension   . Myocardial infarction ([place]) 1999  . Peripheral neuropathy   . Previous back surgery    discs placed in cervical spine  . Rotator cuff syndrome   . Shoulder pain, left   . Sleep apnea, obstructive    uses BIPAP at night   Past Surgical History:  Procedure Laterality Date  . COLONOSCOPY  05/05/2011   Procedure: COLONOSCOPY;  Surgeon: Lear Ng, MD;  Location: [facility] ENDOSCOPY;  Service: Endoscopy;  Laterality: N/A;  . CORONARY ARTERY BYPASS GRAFT  1999  . ESOPHAGOGASTRODUODENOSCOPY  05/05/2011   Procedure: ESOPHAGOGASTRODUODENOSCOPY (EGD);  Surgeon: Lear Ng, MD;  Location[facility] END[facility]COPY;   Service: Endoscopy;  Laterality: N/A;  . EYE SURGERY  08/09   Cataract Removal, bilateral  . HERNIA REPAIR     Inguinal Herniorhaphy  . LEFT HEART CATHETERIZATION WITH CORONARY ANGIOGRAM N/A 08/24/2013   Procedure: LEFT HEART CATHETERIZATION WITH CORONARY ANGIOGRAM;  Surgeon: Troy Sine, MD;  Location: [facility] CATH LAB;  Service: Cardiovascular;  Laterality: N/A;  . LEFT HEART CATHETERIZATION WITH CORONARY/GRAFT ANGIOGRAM Right 03/09/2011   Procedure: LEFT HEART CATHETERIZATION WITH Beatrix Fetters;  Surgeon: Lorretta Harp, MD;  Location: [facility] CATH LAB;  Service: Cardiovascular;  Laterality: Right;  . SPINE SURGERY  10/09   C3-4, C4-5 diskectomy   Social History:  reports that he has never smoked. He has never used smokeless tobacco. He reports that he drinks alcohol. He reports that he does not use drugs.  No Known Allergies  Family History  Problem Relation Age of Onset  . Heart attack Mother     Died at 68 with MI  . Heart attack Father     Died at unknown age with MI  . Heart disease Sister     CABG  . Heart attack Brother     Prior to Admission medications   Medication Sig Start Date End Date Taking? Authorizing Provider  acetaminophen (TYLENOL) 500 MG tablet Take 1,000 mg by mouth every 6 (six) hours as needed.   Yes Historical Provider, MD  amLODipine (NORVASC) 10 MG tablet TAKE ONE TABLET BY MOUTH ONE TIME DAILY  11/22/13  Yes Nischal Narendra, MD  aspirin EC 81 MG tablet Take 81 mg by  mouth daily.   Yes Historical Provider, MD  atorvastatin (LIPITOR) 80 MG tablet Take 80 mg by mouth daily at 6 PM.    Yes Historical Provider, MD  carvedilol (COREG) 12.5 MG tablet Take 1 tablet (12.5 mg total) by mouth 2 (two) times daily with a meal.   Yes Blain Pais, MD  ferrous sulfate 325 (65 FE) MG tablet Take 325 mg by mouth daily with breakfast.   Yes Historical Provider, MD  isosorbide mononitrate (IMDUR) 30 MG 24 hr tablet Take 1 tablet (30 mg total) by mouth  daily. Patient taking differently: Take 60 mg by mouth daily.  08/24/13  Yes Dorothy Spark, MD  lisinopril (PRINIVIL,ZESTRIL) 40 MG tablet Take one tablet by mouth one time daily 09/05/13  Yes Blain Pais, MD  nitroGLYCERIN (NITROSTAT) 0.4 MG SL tablet Place 1 tablet (0.4 mg total) under the tongue every 5 (five) minutes. Up to 3 times as needed for chest pain 08/08/13  Yes Dorothy Spark, MD  pravastatin (PRAVACHOL) 80 MG tablet Take 1 tablet (80 mg total) by mouth daily. Patient not taking: Reported on 05/10/2016 03/15/13   Blain Pais, MD  traMADol Veatrice Bourbon) 50 MG tablet Take 1 to 2 tablets twice a day as needed for pain Patient not taking: Reported on 05/10/2016 03/19/15   Thurman Coyer, DO   Physical Exam: Vitals:   05/10/16 1344 05/10/16 1500 05/10/16 1525 05/10/16 1530  BP: (!) 147/76 139/79 120/67 113/64  Pulse: 76 71 69 71  Resp: 20 11 (!) 23 18  Temp: 98.5 F (36.9 C)     TempSrc: Oral     SpO2: 100% 99% 99% 98%  Weight: 118.4 kg (261 lb)     Height: 5\' 10"  (1.778 m)        General:  No apparent distress, WDWN, [place]/AT  Eyes: PERRL, EOMI, no scleral icterus, conjunctiva clear  ENT: moist oropharynx without exudate, TM's benign, dentition fair  Neck: supple, no lymphadenopathy. No bruits or thyromegaly  Cardiovascular: regular rate without MRG; 2+ peripheral pulses, no JVD, trace peripheral edema  Respiratory: CTA biL, good air movement without wheezing, rhonchi or crackled. Respiratory effort normal  Abdomen: soft, non tender to palpation, positive bowel sounds, no guarding, no rebound  Skin: no rashes or lesions  Musculoskeletal: normal bulk and tone, no joint swelling  Psychiatric: normal mood and affect, A&OX3  Neurologic: CN 2-12 grossly intact, Motor strength 5/5 in all 4 groups with symmetric DTR's and non-focal sensory exam  Labs on Admission:  Basic Metabolic Panel:  Recent Labs Lab 05/10/16 1347  NA 135  K 3.9  CL 105  CO2 25   GLUCOSE 109*  BUN 13  CREATININE 0.85  CALCIUM 8.6*   Liver Function Tests: No results for input(s): AST, ALT, ALKPHOS, BILITOT, PROT, ALBUMIN in the last 168 hours. No results for input(s): LIPASE, AMYLASE in the last 168 hours. No results for input(s): AMMONIA in the last 168 hours. CBC:  Recent Labs Lab 05/10/16 1347  WBC 3.6*  HGB 9.1*  HCT 28.1*  MCV 71.3*  PLT 234   Cardiac Enzymes:  Recent Labs Lab 05/10/16 1347  TROPONINI <0.03    BNP (last 3 results) No results for input(s): BNP in the last 8760 hours.  ProBNP (last 3 results) No results for input(s): PROBNP in the last 8760 hours.  CBG: No results for input(s): GLUCAP in the last 168 hours.  Radiological Exams on Admission: Dg Chest 2 View  Result  Date: 05/10/2016 CLINICAL DATA:  Central chest pain radiating to right arm. EXAM: CHEST  2 VIEW COMPARISON:  None. FINDINGS: Postsurgical changes from CABG noted. Cardiomediastinal silhouette is normal. Mediastinal contours appear intact. There is no evidence of focal airspace consolidation, pleural effusion or pneumothorax. Osseous structures are without acute abnormality. Soft tissues are grossly normal. IMPRESSION: No active cardiopulmonary disease. Electronically Signed   By: Fidela Salisbury M.D.   On: 05/10/2016 14:10    EKG: Independently reviewed.  Assessment/Plan Principal Problem:   Unstable angina (HCC) Active Problems:   Anemia   Guaiac positive stools   ASCVD (arteriosclerotic cardiovascular disease)   Will admit to floor with telemetry and follow enzymes. Order echo and Cardiology consult. Begin IV Protonix. Clear liquid diet. Guaiac stools and follow hgb. Consult GI. Repeat labs in AM.  Diet: clear liquids Fluids: NS@75  DVT Prophylaxis: none due to bleeding  Code Status: FULL  Family Communication: yes  Disposition Plan: home  Time spent: 55 min

## 2016-05-10 NOTE — ED Notes (Signed)
RN spoke to Dr. Doy Hutching. MD verbalized to hold blood transfusion at this time.

## 2016-05-10 NOTE — ED Triage Notes (Signed)
Pt reports central chest pain radiating to right arm that began today. Pt also reports SOB. Pt has cardiac history with CABG.

## 2016-05-10 NOTE — ED Provider Notes (Signed)
[facility] Emergency Department Provider Note  ____________________________________________  Time seen: Approximately 3:13 PM  I have reviewed the triage vital signs and the nursing notes.   HISTORY  Chief Complaint Chest Pain and Shortness of Breath   HPI William [name]. is a 71 y.o. male with a history of coronary artery disease status post CABG, iron deficiency anemia, hypertension, hyperlipidemia who presents for evaluation of chest pain and shortness of breath. Patient reports for the last 6 months has been having exertional chest pain and shortness of breath today usually resolves once he rests for a few minutes. Today he was walking at [facility] and had a more severe episode that persisted for multiple hours and is still ongoing. He describes as a chest heaviness located in the center of his chest, radiating down his right arm, associated with shortness of breath, lightheadedness, nausea, and diaphoresis. He reports the episode started today at noon. Initially was severe but now he describes as a 4 out of 10.Patient denies weakness or numbness of his extremities, radiation to the pain to his back, abdominal pain, fever, chills, URI symptoms. Of note, patient also describes black stools every time he takes a laxative. Last black stool 2 days ago. He is on iron supplementation. He is not on blood thinners. Denies any prior history of GI bleeding.  Past Medical History:  Diagnosis Date  . Angina   . Arthritis   . Blind right eye   . CAD (coronary artery disease)   . ED (erectile dysfunction)   . GERD (gastroesophageal reflux disease)   . Glaucoma    RT eye  . Goiter    removed  . Hyperlipidemia   . Hypertension   . Myocardial infarction ([place]) 1999  . Peripheral neuropathy   . Previous back surgery    discs placed in cervical spine  . Rotator cuff syndrome   . Shoulder pain, left   . Sleep apnea, obstructive    uses BIPAP at night    Patient  Active Problem List   Diagnosis Date Noted  . Anemia 05/10/2016  . Guaiac positive stools 05/10/2016  . ASCVD (arteriosclerotic cardiovascular disease) 05/10/2016  . Shortness of breath 10/23/2011  . Left groin pain 05/12/2011  . Personal history of colonic polyps 05/05/2011  . Unstable angina ([place]) 03/11/2011  . Iron deficiency anemia 03/11/2011  . Glaucoma of right eye 06/26/2010  . TENDINITIS, CALCIFIC, SHOULDER, LEFT 03/12/2010  . PARTIAL TEAR OF ROTATOR CUFF 03/12/2010  . ROTATOR CUFF SYNDROME 02/15/2010  . DE QUERVAIN'S TENOSYNOVITIS, RIGHT WRIST 04/13/2009  . GANGLION CYST 03/05/2009  . HYPERLIPIDEMIA TYPE IIB / III 04/05/2008  . CAD, ARTERY BYPASS GRAFT 04/05/2008  . PEPTIC ULCER DISEASE 04/05/2008  . ERECTILE DYSFUNCTION 09/02/2007  . DENTAL CARIES 09/02/2007  . SPINAL STENOSIS, CERVICAL 09/02/2007  . GLUCOSE INTOLERANCE 07/29/2007  . SHOULDER PAIN, RIGHT 07/28/2007  . NUMBNESS, HAND 07/28/2007  . SKIN TAG 05/13/2007  . OBESITY 01/29/2007  . GERD 09/10/2006  . HYPERTENSION 05/27/2006  . LEG PAIN, CHRONIC 01/23/2006  . THYROIDECTOMY, HX OF 01/23/2006  . SLEEP APNEA, OBSTRUCTIVE 01/22/2006  . CATARACT, LEFT EYE 01/22/2006  . ANGINA PECTORIS 01/22/2006  . CLAUDICATION 01/22/2006  . ARTHRITIS, HANDS, BILATERAL 01/22/2006  . [place] DISEASE, LUMBOSACRAL SPINE 01/22/2006  . STENOSIS, LUMBAR SPINE 01/22/2006    Past Surgical History:  Procedure Laterality Date  . COLONOSCOPY  05/05/2011   Procedure: COLONOSCOPY;  Surgeon: Lear Ng, MD;  Location: [place];  Service:  Endoscopy;  Laterality: N/A;  . CORONARY ARTERY BYPASS GRAFT  1999  . ESOPHAGOGASTRODUODENOSCOPY  05/05/2011   Procedure: ESOPHAGOGASTRODUODENOSCOPY (EGD);  Surgeon: Lear Ng, MD;  Location: [facility] ENDOSCOPY;  Service: Endoscopy;  Laterality: N/A;  . EYE SURGERY  08/09   Cataract Removal, bilateral  . HERNIA REPAIR     Inguinal Herniorhaphy  . LEFT HEART CATHETERIZATION WITH CORONARY  ANGIOGRAM N/A 08/24/2013   Procedure: LEFT HEART CATHETERIZATION WITH CORONARY ANGIOGRAM;  Surgeon: Troy Sine, MD;  Location[facility] C[facility] LAB;  Service: Cardiovascular;  Laterality: N/A;  . LEFT HEART CATHETERIZATION WITH CORONARY/GRAFT ANGIOGRAM Right 03/09/2011   Procedure: LEFT HEART CATHETERIZATION WITH Beatrix Fetters;  Surgeon: Lorretta Harp, MD;  Location: [facility] CATH LAB;  Service: Cardiovascular;  Laterality: Right;  . SPINE SURGERY  10/09   C3-4, C4-5 diskectomy    Prior to Admission medications   Medication Sig Start Date End Date Taking? Authorizing Provider  acetaminophen (TYLENOL) 500 MG tablet Take 1,000 mg by mouth every 6 (six) hours as needed.   Yes Historical Provider, MD  amLODipine (NORVASC) 10 MG tablet TAKE ONE TABLET BY MOUTH ONE TIME DAILY  11/22/13  Yes Nischal Narendra, MD  aspirin EC 81 MG tablet Take 81 mg by mouth daily.   Yes Historical Provider, MD  atorvastatin (LIPITOR) 80 MG tablet Take 80 mg by mouth daily at 6 PM.    Yes Historical Provider, MD  carvedilol (COREG) 12.5 MG tablet Take 1 tablet (12.5 mg total) by mouth 2 (two) times daily with a meal.   Yes Blain Pais, MD  ferrous sulfate 325 (65 FE) MG tablet Take 325 mg by mouth daily with breakfast.   Yes Historical Provider, MD  isosorbide mononitrate (IMDUR) 30 MG 24 hr tablet Take 1 tablet (30 mg total) by mouth daily. Patient taking differently: Take 60 mg by mouth daily.  08/24/13  Yes Dorothy Spark, MD  lisinopril (PRINIVIL,ZESTRIL) 40 MG tablet Take one tablet by mouth one time daily 09/05/13  Yes Blain Pais, MD  nitroGLYCERIN (NITROSTAT) 0.4 MG SL tablet Place 1 tablet (0.4 mg total) under the tongue every 5 (five) minutes. Up to 3 times as needed for chest pain 08/08/13  Yes Dorothy Spark, MD  pravastatin (PRAVACHOL) 80 MG tablet Take 1 tablet (80 mg total) by mouth daily. Patient not taking: Reported on 05/10/2016 03/15/13   Blain Pais, MD  traMADol Veatrice Bourbon) 50  MG tablet Take 1 to 2 tablets twice a day as needed for pain Patient not taking: Reported on 05/10/2016 03/19/15   Thurman Coyer, DO    Allergies Patient has no known allergies.  Family History  Problem Relation Age of Onset  . Heart attack Mother     Died at 33 with MI  . Heart attack Father     Died at unknown age with MI  . Heart disease Sister     CABG  . Heart attack Brother     Social History Social History  Substance Use Topics  . Smoking status: Never Smoker  . Smokeless tobacco: Never Used  . Alcohol use Yes     Comment: once a month, a few at a time    Review of Systems  Constitutional: Negative for fever. + Lightheadedness Eyes: Negative for visual changes. ENT: Negative for sore throat. Neck: No neck pain  Cardiovascular: + chest pain. Respiratory: + shortness of breath. Gastrointestinal: Negative for abdominal pain, vomiting or diarrhea. + nausea Genitourinary: Negative  for dysuria. Musculoskeletal: Negative for back pain. Skin: Negative for rash. Neurological: Negative for headaches, weakness or numbness. Psych: No SI or HI  ____________________________________________   PHYSICAL EXAM:  VITAL SIGNS: ED Triage Vitals [05/10/16 1344]  Enc Vitals Group     BP (!) 147/76     Pulse Rate 76     Resp 20     Temp 98.5 F (36.9 C)     Temp Source Oral     SpO2 100 %     Weight 261 lb (118.4 kg)     Height 5\' 10"  (1.778 m)     Head Circumference      Peak Flow      Pain Score 8     Pain Loc      Pain Edu?      Excl. in [place]?     Constitutional: Alert and oriented. Well appearing and in no apparent distress. HEENT:      Head: Normocephalic and atraumatic.         Eyes: Conjunctivae are normal. Sclera is non-icteric. EOMI. PERRL      Mouth/Throat: Mucous membranes are moist.       Neck: Supple with no signs of meningismus. Cardiovascular: Regular rate and rhythm. No murmurs, gallops, or rubs. 2+ symmetrical distal pulses are present in all  extremities. No JVD. Respiratory: Normal respiratory effort. Lungs are clear to auscultation bilaterally. No wheezes, crackles, or rhonchi.  Gastrointestinal: Soft, non tender, and non distended with positive bowel sounds. No rebound or guarding. Genitourinary: No CVA tenderness.Rectal exam showing brown stool guaiac positive Musculoskeletal: Nontender with normal range of motion in all extremities. No edema, cyanosis, or erythema of extremities. Neurologic: Normal speech and language. Face is symmetric. Moving all extremities. No gross focal neurologic deficits are appreciated. Skin: Skin is warm, dry and intact. No rash noted. Psychiatric: Mood and affect are normal. Speech and behavior are normal.  ____________________________________________   LABS (all labs ordered are listed, but only abnormal results are displayed)  Labs Reviewed  BASIC METABOLIC PANEL - Abnormal; Notable for the following:       Result Value   Glucose, Bld 109 (*)    Calcium 8.6 (*)    All other components within normal limits  CBC - Abnormal; Notable for the following:    WBC 3.6 (*)    RBC 3.94 (*)    Hemoglobin 9.1 (*)    HCT 28.1 (*)    MCV 71.3 (*)    MCH 23.0 (*)    RDW 15.6 (*)    All other components within normal limits  TROPONIN I - Abnormal; Notable for the following:    Troponin I 0.03 (*)    All other components within normal limits  TROPONIN I  TROPONIN I  TROPONIN I  COMPREHENSIVE METABOLIC PANEL  CBC  TYPE AND SCREEN  PREPARE RBC (CROSSMATCH)  ABO/RH   ____________________________________________  EKG  ED ECG REPORT I, Rudene Re, the attending physician, personally viewed and interpreted this ECG.  Normal sinus rhythm with occasional PVCs, rate of 75, normal intervals, normal axis, no ST elevations or depressions.  15:24 - Normal sinus rhythm with occasional PVCs, normal intervals, normal axis, no ST elevations or depressions. No significant changes compared to initial  EKG from today. ____________________________________________  RADIOLOGY  CXR: Negative ____________________________________________   PROCEDURES  Procedure(s) performed: None Procedures Critical Care performed:  None ____________________________________________   INITIAL IMPRESSION / ASSESSMENT AND PLAN / ED COURSE  71 y.o. male with  a history of coronary artery disease status post CABG, iron deficiency anemia, hypertension, hyperlipidemia who presents for evaluation of central chest pressure radiating to his right arm associated with shortness of breath, nausea, and lightheadedness while walking at [facility] at 12PM. Patient has ongoing chest discomfort which is currently 4 out of 10. Patient is well-appearing distress, he has normal vital signs, physical exam no acute findings, normal equal pulses no 4 extremities, neurologically intact, heart lungs are clear with no acute findings, no pitting edema. Rectal exam showing brown stool guaiac positive. Blood work showing normal troponin, EKG no ischemic changes. Patient is anemic with hemoglobin of 9.1 (last known was 10.5 om 07/2015). Chest x-ray with no acute findings. Presentation concerning for unstable angina probably exacerbated by anemia in the setting of LGIB. We'll repeat EKG since patient continues to endorse chest pain, give nitroglycerin, aspirin, type and screen, and admitted to his service.    Clinical Course as of May 11 2319  Sat May 10, 2016  1541 Repeat EKG with no ischemic changes. Patient is pain-free after 1 sublingual nitroglycerin. We'll admit to the hospitalist.  [CV]    Clinical Course User Index [CV] Rudene Re, MD      Pertinent labs & imaging results that were available during my care of the patient were reviewed by me and considered in my medical decision making (see chart for details).    ____________________________________________   FINAL CLINICAL IMPRESSION(S) / ED DIAGNOSES  Final  diagnoses:  Unstable angina (HCC)  Lower GI bleed      NEW MEDICATIONS STARTED DURING THIS VISIT:  Current Discharge Medication List       Note:  This document was prepared using Dragon voice recognition software and may include unintentional dictation errors.    Rudene Re, MD 05/10/16 2322

## 2016-05-11 ENCOUNTER — Inpatient Hospital Stay: Admit: 2016-05-11 | Payer: Medicare Other

## 2016-05-11 DIAGNOSIS — K921 Melena: Secondary | ICD-10-CM | POA: Diagnosis not present

## 2016-05-11 DIAGNOSIS — R0789 Other chest pain: Secondary | ICD-10-CM | POA: Diagnosis not present

## 2016-05-11 LAB — CBC
HEMATOCRIT: 27.3 % — AB (ref 40.0–52.0)
HEMOGLOBIN: 8.7 g/dL — AB (ref 13.0–18.0)
MCH: 23.1 pg — ABNORMAL LOW (ref 26.0–34.0)
MCHC: 31.9 g/dL — ABNORMAL LOW (ref 32.0–36.0)
MCV: 72.4 fL — ABNORMAL LOW (ref 80.0–100.0)
Platelets: 204 10*3/uL (ref 150–440)
RBC: 3.77 MIL/uL — AB (ref 4.40–5.90)
RDW: 15.6 % — ABNORMAL HIGH (ref 11.5–14.5)
WBC: 4.2 10*3/uL (ref 3.8–10.6)

## 2016-05-11 LAB — COMPREHENSIVE METABOLIC PANEL
ALBUMIN: 3.4 g/dL — AB (ref 3.5–5.0)
ALT: 12 U/L — ABNORMAL LOW (ref 17–63)
ANION GAP: 6 (ref 5–15)
AST: 16 U/L (ref 15–41)
Alkaline Phosphatase: 65 U/L (ref 38–126)
BILIRUBIN TOTAL: 0.5 mg/dL (ref 0.3–1.2)
BUN: 9 mg/dL (ref 6–20)
CO2: 26 mmol/L (ref 22–32)
Calcium: 8.3 mg/dL — ABNORMAL LOW (ref 8.9–10.3)
Chloride: 104 mmol/L (ref 101–111)
Creatinine, Ser: 0.84 mg/dL (ref 0.61–1.24)
GFR calc non Af Amer: >60 mL/min (ref >60)
GLUCOSE: 86 mg/dL (ref 65–99)
POTASSIUM: 3.8 mmol/L (ref 3.5–5.1)
SODIUM: 136 mmol/L (ref 135–145)
TOTAL PROTEIN: 6.9 g/dL (ref 6.5–8.1)

## 2016-05-11 LAB — PROTIME-INR
INR: 1.15
Prothrombin Time: 14.8 seconds (ref 11.4–15.2)

## 2016-05-11 LAB — IRON AND TIBC
IRON: 14 ug/dL — AB (ref 45–182)
SATURATION RATIOS: 4 % — AB (ref 17.9–39.5)
TIBC: 365 ug/dL (ref 250–450)
UIBC: 351 ug/dL

## 2016-05-11 LAB — TROPONIN I
TROPONIN I: 0.03 ng/mL — AB (ref <0.03)
Troponin I: <0.03 ng/mL (ref <0.03)

## 2016-05-11 LAB — GLUCOSE, CAPILLARY: GLUCOSE-CAPILLARY: 94 mg/dL (ref 65–99)

## 2016-05-11 LAB — FERRITIN: Ferritin: 5 ng/mL — ABNORMAL LOW (ref 24–336)

## 2016-05-11 MED ORDER — PEG 3350-KCL-NA BICARB-NACL 420 G PO SOLR
4000.0000 mL | Freq: Once | ORAL | Status: DC
  Filled 2016-05-11: qty 4000

## 2016-05-11 MED ORDER — SODIUM CHLORIDE 0.9 % IV SOLN
510.0000 mg | Freq: Once | INTRAVENOUS | Status: AC
  Administered 2016-05-11: 510 mg via INTRAVENOUS
  Filled 2016-05-11: qty 17

## 2016-05-11 MED ORDER — SODIUM CHLORIDE 0.9 % IV SOLN
INTRAVENOUS | Status: DC
  Administered 2016-05-13: 08:00:00 via INTRAVENOUS

## 2016-05-11 NOTE — Consult Note (Signed)
[facility] Cardiology  CARDIOLOGY CONSULT NOTE  Patient ID: William [name]. MRN: [id number] DOB/AGE: Jun 19, 1945 71 y.o.  Admit date: 05/10/2016 Referring Physician Mody Primary Physician [facility] Primary Cardiologist Fath Reason for Consultation Chest pain  HPI: 71 year old gentleman referred for evaluation chest pain. The patient has known coronary disease, status post CABG 1999. Cardiac catheterization 08/2013 revealed patent LIMA to LAD, patent RIMA to RCA with occluded distal native vessel, patent SVG to ramus and OM1 with moderate native vessel disease. She underwent Lexiscan Myoview study 05/24/2015 which revealed LVEF of 45% with moderate inferolateral ischemia which appears similar to prior Myoview study. Patient has intermittent episodes chest pain. Yesterday, the patient had more significant chest pain with radiation to his right arm and presented to [facility] emergency room. ECG did not reveal any acute ischemic ST-T wave changes. Troponin was 0.03. Patient was found to be anemic with a hemoglobin and hematocrit of 8.7 and 27.3, respectively. Stools were guaiac positive.  Review of systems complete and found to be negative unless listed above     Past Medical History:  Diagnosis Date  . Angina   . Arthritis   . Blind right eye   . CAD (coronary artery disease)   . ED (erectile dysfunction)   . GERD (gastroesophageal reflux disease)   . Glaucoma    RT eye  . Goiter    removed  . Hyperlipidemia   . Hypertension   . Myocardial infarction ([place]) 1999  . Peripheral neuropathy   . Previous back surgery    discs placed in cervical spine  . Rotator cuff syndrome   . Shoulder pain, left   . Sleep apnea, obstructive    uses BIPAP at night    Past Surgical History:  Procedure Laterality Date  . COLONOSCOPY  05/05/2011   Procedure: COLONOSCOPY;  Surgeon: Lear Ng, MD;  Location: [facility] ENDOSCOPY;  Service: Endoscopy;  Laterality: N/A;  . CORONARY ARTERY BYPASS GRAFT  1999  .  ESOPHAGOGASTRODUODENOSCOPY  05/05/2011   Procedure: ESOPHAGOGASTRODUODENOSCOPY (EGD);  Surgeon: Lear Ng, MD;  Location: [facility] ENDOSCOPY;  Service: Endoscopy;  Laterality: N/A;  . EYE SURGERY  08/09   Cataract Removal, bilateral  . HERNIA REPAIR     Inguinal Herniorhaphy  . LEFT HEART CATHETERIZATION WITH CORONARY ANGIOGRAM N/A 08/24/2013   Procedure: LEFT HEART CATHETERIZATION WITH CORONARY ANGIOGRAM;  Surgeon: Troy Sine, MD;  Location: [facility] CATH LAB;  Service: Cardiovascular;  Laterality: N/A;  . LEFT HEART CATHETERIZATION WITH CORONARY/GRAFT ANGIOGRAM Right 03/09/2011   Procedure: LEFT HEART CATHETERIZATION WITH Beatrix Fetters;  Surgeon: Lorretta Harp, MD;  Location: [facility] CATH LAB;  Service: Cardiovascular;  Laterality: Right;  . SPINE SURGERY  10/09   C3-4, C4-5 diskectomy    Prescriptions Prior to Admission  Medication Sig Dispense Refill Last Dose  . acetaminophen (TYLENOL) 500 MG tablet Take 1,000 mg by mouth every 6 (six) hours as needed.   prn at prn  . amLODipine (NORVASC) 10 MG tablet TAKE ONE TABLET BY MOUTH ONE TIME DAILY  30 tablet 0 05/09/2016 at am  . aspirin EC 81 MG tablet Take 81 mg by mouth daily.   05/09/2016 at am  . atorvastatin (LIPITOR) 80 MG tablet Take 80 mg by mouth daily at 6 PM.    05/09/2016 at pm  . carvedilol (COREG) 12.5 MG tablet Take 1 tablet (12.5 mg total) by mouth 2 (two) times daily with a meal. 60 tablet 6 05/09/2016 at pm  . ferrous sulfate 325 (65  FE) MG tablet Take 325 mg by mouth daily with breakfast.   05/09/2016 at am  . isosorbide mononitrate (IMDUR) 30 MG 24 hr tablet Take 1 tablet (30 mg total) by mouth daily. (Patient taking differently: Take 60 mg by mouth daily. ) 90 tablet 3 05/09/2016 at am  . lisinopril (PRINIVIL,ZESTRIL) 40 MG tablet Take one tablet by mouth one time daily 90 tablet 9 05/09/2016 at am  . nitroGLYCERIN (NITROSTAT) 0.4 MG SL tablet Place 1 tablet (0.4 mg total) under the tongue every 5 (five) minutes. Up to 3  times as needed for chest pain 25 tablet 3 prn at prn  . pravastatin (PRAVACHOL) 80 MG tablet Take 1 tablet (80 mg total) by mouth daily. (Patient not taking: Reported on 05/10/2016) 90 tablet 4 Not Taking at Unknown time  . traMADol (ULTRAM) 50 MG tablet Take 1 to 2 tablets twice a day as needed for pain (Patient not taking: Reported on 05/10/2016) 60 tablet 1 Not Taking at Unknown time   Social History   Social History  . Marital status: Married    Spouse name: N/A  . Number of children: N/A  . Years of education: N/A   Occupational History  . Not on file.   Social History Main Topics  . Smoking status: Never Smoker  . Smokeless tobacco: Never Used  . Alcohol use Yes     Comment: once a month, a few at a time  . Drug use: No     Comment: in past, 30 years ago  . Sexual activity: Yes   Other Topics Concern  . Not on file   Social History Narrative  . No narrative on file    Family History  Problem Relation Age of Onset  . Heart attack Mother     Died at 10 with MI  . Heart attack Father     Died at unknown age with MI  . Heart disease Sister     CABG  . Heart attack Brother       Review of systems complete and found to be negative unless listed above      PHYSICAL EXAM  General: Well developed, well nourished, in no acute distress HEENT:  Normocephalic and atramatic Neck:  No JVD.  Lungs: Clear bilaterally to auscultation and percussion. Heart: HRRR . Normal S1 and S2 without gallops or murmurs.  Abdomen: Bowel sounds are positive, abdomen soft and non-tender  Msk:  Back normal, normal gait. Normal strength and tone for age. Extremities: No clubbing, cyanosis or edema.   Neuro: Alert and oriented X 3. Psych:  Good affect, responds appropriately  Labs:   Lab Results  Component Value Date   WBC 4.2 05/11/2016   HGB 8.7 (L) 05/11/2016   HCT 27.3 (L) 05/11/2016   MCV 72.4 (L) 05/11/2016   PLT 204 05/11/2016    Recent Labs Lab 05/11/16 0551  NA 136   K 3.8  CL 104  CO2 26  BUN 9  CREATININE 0.84  CALCIUM 8.3*  PROT 6.9  BILITOT 0.5  ALKPHOS 65  ALT 12*  AST 16  GLUCOSE 86   Lab Results  Component Value Date   CKTOTAL 94 03/10/2011   CKMB 3.6 03/10/2011   TROPONINI <0.03 05/11/2016    Lab Results  Component Value Date   CHOL 141 08/17/2013   CHOL  02/13/2010    159        ATP III CLASSIFICATION:  <200     mg/dL  Desirable  200-239  mg/dL   Borderline High  >=240    mg/dL   High          CHOL 164 02/03/2008   Lab Results  Component Value Date   HDL 37.10 (L) 08/17/2013   HDL 32 (L) 02/13/2010   HDL 38 (L) 02/03/2008   Lab Results  Component Value Date   LDLCALC 92 08/17/2013   LDLCALC (H) 02/13/2010    106        Total Cholesterol/HDL:CHD Risk Coronary Heart Disease Risk Table                     Men   Women  1/2 Average Risk   3.4   3.3  Average Risk       5.0   4.4  2 X Average Risk   9.6   7.1  3 X Average Risk  23.4   11.0        Use the calculated Patient Ratio above and the CHD Risk Table to determine the patient's CHD Risk.        ATP III CLASSIFICATION (LDL):  <100     mg/dL   Optimal  100-129  mg/dL   Near or Above                    Optimal  130-159  mg/dL   Borderline  160-189  mg/dL   High  >190     mg/dL   Very High   LDLCALC 105 (H) 02/03/2008   Lab Results  Component Value Date   TRIG 62.0 08/17/2013   TRIG 105 02/13/2010   TRIG 104 02/03/2008   Lab Results  Component Value Date   CHOLHDL 4 08/17/2013   CHOLHDL 5.0 02/13/2010   CHOLHDL 4.3 Ratio 02/03/2008   No results found for: LDLDIRECT    Radiology: Dg Chest 2 View  Result Date: 05/10/2016 CLINICAL DATA:  Central chest pain radiating to right arm. EXAM: CHEST  2 VIEW COMPARISON:  None. FINDINGS: Postsurgical changes from CABG noted. Cardiomediastinal silhouette is normal. Mediastinal contours appear intact. There is no evidence of focal airspace consolidation, pleural effusion or pneumothorax. Osseous structures  are without acute abnormality. Soft tissues are grossly normal. IMPRESSION: No active cardiopulmonary disease. Electronically Signed   By: Fidela Salisbury M.D.   On: 05/10/2016 14:10    EKG: Normal sinus rhythm  ASSESSMENT AND PLAN:   1. Chest pain, nondiagnostic ECG, negative troponin, in patient with known coronary disease, status post CABG, with most recent cardiac catheterization revealing patent bypass grafts, with distal native disease in setting of significant anemia, with guaiac-positive stool 2. Anemia, guaiac-positive stool  Recommendations  1. Agree with current therapy 2. Defer full dose anticoagulation 3. Hesitant to proceed directly to cardiac catheterization in light of anemia and guaiac positive stools 4. [place] study in a.m. to see if there is progression of underlying coronary artery disease 5. Further recommendations pending Lexiscan Myoview result and GI consult  Signed: Isaias Cowman MD,PhD, [facility] 05/11/2016, 9:05 AM

## 2016-05-11 NOTE — Progress Notes (Signed)
[facility] [place] NAME: William [name]    MR#:  [id number]  DATE OF BIRTH:  October 19, 1945  SUBJECTIVE:   Patient presented with chest pain radiating to his left arm. No chest pain overnight. Patient also complains of dark colored stools.   REVIEW OF SYSTEMS:    Review of Systems  Constitutional: Negative for fever, chills weight loss HENT: Negative for ear pain, nosebleeds, congestion, facial swelling, rhinorrhea, neck pain, neck stiffness and ear discharge.   Respiratory: Negative for cough, shortness of breath, wheezing  Cardiovascular: Negative for chest pain (Chest pain is resolved), palpitations and leg swelling.  Gastrointestinal: Negative for heartburn, abdominal pain, vomiting, diarrhea or consitpation Positive for melena Genitourinary: Negative for dysuria, urgency, frequency, hematuria Musculoskeletal: Negative for back pain or joint pain Neurological: Negative for dizziness, seizures, syncope, focal weakness,  numbness and headaches.  Hematological: Does not bruise/bleed easily.  Psychiatric/Behavioral: Negative for hallucinations, confusion, dysphoric mood    Tolerating Diet:yes      DRUG ALLERGIES:  No Known Allergies  VITALS:  Blood pressure (!) 148/71, pulse 72, temperature 98.3 F (36.8 C), temperature source Oral, resp. rate 16, height 5\' 10"  (1.778 m), weight 118.4 kg (261 lb), SpO2 100 %.  PHYSICAL EXAMINATION:  Constitutional: Appears well-developed and well-nourished. No distress. HENT: Normocephalic. Marland Kitchen Oropharynx is clear and moist.  Eyes: Conjunctivae and EOM are normal. PERRLA, no scleral icterus.  Neck: Normal ROM. Neck supple. No JVD. No tracheal deviation. CVS: RRR, S1/S2 +, no murmurs, no gallops, no carotid bruit.  Pulmonary: Effort and breath sounds normal, no stridor, rhonchi, wheezes, rales.  Abdominal: Soft. BS +,  no distension, tenderness, rebound or guarding.  Musculoskeletal: Normal range of  motion. No edema and no tenderness.  Neuro: Alert. CN 2-12 grossly intact. No focal deficits. Skin: Skin is warm and dry. No rash noted. Psychiatric: Normal mood and affect.      LABORATORY PANEL:   CBC  Recent Labs Lab 05/11/16 0551  WBC 4.2  HGB 8.7*  HCT 27.3*  PLT 204   ------------------------------------------------------------------------------------------------------------------  Chemistries   Recent Labs Lab 05/11/16 0551  NA 136  K 3.8  CL 104  CO2 26  GLUCOSE 86  BUN 9  CREATININE 0.84  CALCIUM 8.3*  AST 16  ALT 12*  ALKPHOS 65  BILITOT 0.5   ------------------------------------------------------------------------------------------------------------------  Cardiac Enzymes  Recent Labs Lab 05/10/16 1759 05/10/16 2348 05/11/16 0551  TROPONINI 0.03* 0.03* <0.03   ------------------------------------------------------------------------------------------------------------------  RADIOLOGY:  Dg Chest 2 View  Result Date: 05/10/2016 CLINICAL DATA:  Central chest pain radiating to right arm. EXAM: CHEST  2 VIEW COMPARISON:  None. FINDINGS: Postsurgical changes from CABG noted. Cardiomediastinal silhouette is normal. Mediastinal contours appear intact. There is no evidence of focal airspace consolidation, pleural effusion or pneumothorax. Osseous structures are without acute abnormality. Soft tissues are grossly normal. IMPRESSION: No active cardiopulmonary disease. Electronically Signed   By: Fidela Salisbury M.D.   On: 05/10/2016 14:10     ASSESSMENT AND PLAN:    71 year old male with a history of coronary artery disease EF 45% and CABG who presented with chest pain and found to have guaiac-positive stools.  1. Chest pain with nondiagnostic ECG and negative troponins Plan for stress test in a.m.  2. Acute blood loss anemia with guaiac positive stools and melena: Continue Protonix GI consult Follow CBC   3.CAD/CABG: Continue aspirin,  atorvastatin, imdur, lisinopril, Coreg  4. Essential hypertension: Continue lisinopril, Imdur and Coreg  Management plans discussed with the patient and he is in agreement.  CODE STATUS: full  TOTAL TIME TAKING CARE OF THIS PATIENT: 30 minutes.     POSSIBLE D/C 1-2 days, DEPENDING ON CLINICAL CONDITION.   Caley Volkert M.D on 05/11/2016 at 10:40 AM  Between 7am to 6pm - Pager - [phone] After 6pm go to [email or web address] - password [email or web address] [facility]  Office  [phone]  CC: Primary care physician; Kirk Ruths., MD  Note: This dictation was prepared with Dragon dictation along with smaller phrase technology. Any transcriptional errors that result from this process are unintentional.

## 2016-05-11 NOTE — Consult Note (Addendum)
Consultation  Referring Provider:     No ref. provider found Primary Care Physician:  Kirk Ruths., MD Primary Gastroenterologist:  Dr. Michail Sermon         Reason for Consultation:     IDA, melena  Date of Admission:  05/10/2016 Date of Consultation:  05/11/2016         HPI:   William [name]. is a 71 y.o. male with CAD, presented with CP, SOB and found to have Hb 9.1, baseline 15 in 08/2013, but it was 13.5 in 06/2015, dropped to 10.5 in 07/2015 under care everywhere. His symptoms are chronic but worsened in last few days. He was seen by cardiology here and r/o ACS. He takes oral iron, reports dark formed BM, but they have turned darker and looser lately. He had EGD and colonoscopy by Dr Michail Sermon in 2013 for severe iron deficiency anemia when he was admitted at [facility]. EGD was negative, colonoscopy revealed polyp only. He denies taking NSAIDs, does not drink ETOH.  His wife is bedside.   His ferritin is 5 during this admission.  Past Medical History:  Diagnosis Date  . Angina   . Arthritis   . Blind right eye   . CAD (coronary artery disease)   . ED (erectile dysfunction)   . GERD (gastroesophageal reflux disease)   . Glaucoma    RT eye  . Goiter    removed  . Hyperlipidemia   . Hypertension   . Myocardial infarction ([place]) 1999  . Peripheral neuropathy   . Previous back surgery    discs placed in cervical spine  . Rotator cuff syndrome   . Shoulder pain, left   . Sleep apnea, obstructive    uses BIPAP at night    Past Surgical History:  Procedure Laterality Date  . COLONOSCOPY  05/05/2011   Procedure: COLONOSCOPY;  Surgeon: Lear Ng, MD;  Location[facility] ENDOSCOPY;  Service: Endoscopy;  Laterality: N/A;  . CORONARY ARTERY BYPASS GRAFT  1999  . ESOPHAGOGASTRODUODENOSCOPY  05/05/2011   Procedure: ESOPHAGOGASTRODUODENOSCOPY (EGD);  Surgeon: Lear Ng, MD;  Location: [facility] ENDOSCOPY;  Service: Endoscopy;  Laterality: N/A;  . EYE SURGERY  08/09    Cataract Removal, bilateral  . HERNIA REPAIR     Inguinal Herniorhaphy  . LEFT HEART CATHETERIZATION WITH CORONARY ANGIOGRAM N/A 08/24/2013   Procedure: LEFT HEART CATHETERIZATION WITH CORONARY ANGIOGRAM;  Surgeon: Troy Sine, MD;  Location[facility] CATH LAB;  Service: Cardiovascular;  Laterality: N/A;  . LEFT HEART CATHETERIZATION WITH CORONARY/GRAFT ANGIOGRAM Right 03/09/2011   Procedure: LEFT HEART CATHETERIZATION WITH Beatrix Fetters;  Surgeon: Lorretta Harp, MD;  Location: [facility] CATH LAB;  Service: Cardiovascular;  Laterality: Right;  . SPINE SURGERY  10/09   C3-4, C4-5 diskectomy    Prior to Admission medications   Medication Sig Start Date End Date Taking? Authorizing Provider  acetaminophen (TYLENOL) 500 MG tablet Take 1,000 mg by mouth every 6 (six) hours as needed.   Yes Historical Provider, MD  amLODipine (NORVASC) 10 MG tablet TAKE ONE TABLET BY MOUTH ONE TIME DAILY  11/22/13  Yes Nischal Narendra, MD  aspirin EC 81 MG tablet Take 81 mg by mouth daily.   Yes Historical Provider, MD  atorvastatin (LIPITOR) 80 MG tablet Take 80 mg by mouth daily at 6 PM.    Yes Historical Provider, MD  carvedilol (COREG) 12.5 MG tablet Take 1 tablet (12.5 mg total) by mouth 2 (two) times daily with a meal.  Yes Blain Pais, MD  ferrous sulfate 325 (65 FE) MG tablet Take 325 mg by mouth daily with breakfast.   Yes Historical Provider, MD  isosorbide mononitrate (IMDUR) 30 MG 24 hr tablet Take 1 tablet (30 mg total) by mouth daily. Patient taking differently: Take 60 mg by mouth daily.  08/24/13  Yes Dorothy Spark, MD  lisinopril (PRINIVIL,ZESTRIL) 40 MG tablet Take one tablet by mouth one time daily 09/05/13  Yes Blain Pais, MD  nitroGLYCERIN (NITROSTAT) 0.4 MG SL tablet Place 1 tablet (0.4 mg total) under the tongue every 5 (five) minutes. Up to 3 times as needed for chest pain 08/08/13  Yes Dorothy Spark, MD  pravastatin (PRAVACHOL) 80 MG tablet Take 1 tablet (80 mg  total) by mouth daily. Patient not taking: Reported on 05/10/2016 03/15/13   Blain Pais, MD  traMADol Veatrice Bourbon) 50 MG tablet Take 1 to 2 tablets twice a day as needed for pain Patient not taking: Reported on 05/10/2016 03/19/15   Thurman Coyer, DO    Family History  Problem Relation Age of Onset  . Heart attack Mother     Died at 71 with MI  . Heart attack Father     Died at unknown age with MI  . Heart disease Sister     CABG  . Heart attack Brother      Social History  Substance Use Topics  . Smoking status: Never Smoker  . Smokeless tobacco: Never Used  . Alcohol use Yes     Comment: once a month, a few at a time    Allergies as of 05/10/2016  . (No Known Allergies)    Review of Systems:    All systems reviewed and negative except where noted in HPI.   Physical Exam:  Vital signs in last 24 hours: Temp:  [98.2 F (36.8 C)-98.5 F (36.9 C)] 98.3 F (36.8 C) (04/29 0800) Pulse Rate:  [69-76] 72 (04/29 0800) Resp:  [11-23] 16 (04/29 0800) BP: (113-153)/(64-81) 148/71 (04/29 0800) SpO2:  [96 %-100 %] 100 % (04/29 0800) Weight:  [118.4 kg (261 lb)] 118.4 kg (261 lb) (04/28 1344) Last BM Date: 05/10/16 General:   Pleasant, cooperative in NAD Head:  Normocephalic and atraumatic. Eyes:   No icterus.   Conjunctiva pink. PERRLA. Ears:  Normal auditory acuity. Neck:  Supple; no masses or thyroidomegaly Lungs: Respirations even and unlabored. Lungs clear to auscultation bilaterally.   No wheezes, crackles, or rhonchi.  Heart:  Regular rate and rhythm;  Without murmur, clicks, rubs or gallops Abdomen:  Soft, nondistended, nontender. Normal bowel sounds. No appreciable masses or hepatomegaly.  No rebound or guarding.  Rectal:  Not performed. Msk:  Symmetrical without gross deformities.   Extremities:  Without edema, cyanosis or clubbing. Neurologic:  Alert and oriented x3;  grossly normal neurologically. Skin:  Intact without significant lesions or rashes. Cervical  Nodes:  No significant cervical adenopathy. Psych:  Alert and cooperative. Normal affect.  LAB RESULTS:  Recent Labs  05/10/16 1347 05/11/16 0551  WBC 3.6* 4.2  HGB 9.1* 8.7*  HCT 28.1* 27.3*  PLT 234 204   BMET  Recent Labs  05/10/16 1347 05/11/16 0551  NA 135 136  K 3.9 3.8  CL 105 104  CO2 25 26  GLUCOSE 109* 86  BUN 13 9  CREATININE 0.85 0.84  CALCIUM 8.6* 8.3*   LFT  Recent Labs  05/11/16 0551  PROT 6.9  ALBUMIN 3.4*  AST 16  ALT 12*  ALKPHOS 65  BILITOT 0.5   PT/INR No results for input(s): LABPROT, INR in the last 72 hours.  STUDIES: Dg Chest 2 View  Result Date: 05/10/2016 CLINICAL DATA:  Central chest pain radiating to right arm. EXAM: CHEST  2 VIEW COMPARISON:  None. FINDINGS: Postsurgical changes from CABG noted. Cardiomediastinal silhouette is normal. Mediastinal contours appear intact. There is no evidence of focal airspace consolidation, pleural effusion or pneumothorax. Osseous structures are without acute abnormality. Soft tissues are grossly normal. IMPRESSION: No active cardiopulmonary disease. Electronically Signed   By: Fidela Salisbury M.D.   On: 05/10/2016 14:10      Impression / Plan:   William [name]. is a 71 y.o. y/o male with CAD, prior h/o IDA, p/w symptomatic anemia in the absence of active GI bleed. Likely related to inadequate iron replacement and his Hb is gradually trending down since 06/2015.   - Continue PPI BID - feraheme x 1 given - oral iron TID as out pt as ferritin is very low - Monitor CBC closely - EGD and colonoscopy on Tuesday as pt is getting stress test tomorrow - Bowel prep tomorrow  Thank you for involving me in the care of this patient.      LOS: 1 day   Sherri Sear, MD  05/11/2016, 11:47 AM

## 2016-05-12 ENCOUNTER — Inpatient Hospital Stay
Admit: 2016-05-12 | Discharge: 2016-05-12 | Disposition: A | Discharge status: Home / Self Care | Payer: Medicare Other | Attending: Internal Medicine | Admitting: Internal Medicine

## 2016-05-12 ENCOUNTER — Inpatient Hospital Stay: Payer: Medicare Other

## 2016-05-12 ENCOUNTER — Encounter: Payer: Self-pay | Admitting: Radiology

## 2016-05-12 DIAGNOSIS — Z951 Presence of aortocoronary bypass graft: Secondary | ICD-10-CM

## 2016-05-12 DIAGNOSIS — R0789 Other chest pain: Secondary | ICD-10-CM | POA: Diagnosis not present

## 2016-05-12 DIAGNOSIS — K921 Melena: Secondary | ICD-10-CM | POA: Diagnosis not present

## 2016-05-12 HISTORY — DX: Presence of aortocoronary bypass graft: Z95.1

## 2016-05-12 LAB — CBC
HEMATOCRIT: 27.5 % — AB (ref 40.0–52.0)
HEMOGLOBIN: 8.9 g/dL — AB (ref 13.0–18.0)
MCH: 22.9 pg — AB (ref 26.0–34.0)
MCHC: 32.2 g/dL (ref 32.0–36.0)
MCV: 71.1 fL — AB (ref 80.0–100.0)
Platelets: 216 10*3/uL (ref 150–440)
RBC: 3.87 MIL/uL — AB (ref 4.40–5.90)
RDW: 15.8 % — ABNORMAL HIGH (ref 11.5–14.5)
WBC: 4.1 10*3/uL (ref 3.8–10.6)

## 2016-05-12 LAB — NM MYOCAR MULTI W/SPECT W/WALL MOTION / EF
CHL CUP STRESS STAGE 1 SPEED: 0 mph
CHL CUP STRESS STAGE 2 SPEED: 0 mph
CHL CUP STRESS STAGE 3 GRADE: 0 %
CHL CUP STRESS STAGE 3 HR: 82 {beats}/min
CHL CUP STRESS STAGE 4 SPEED: 0 mph
CHL CUP STRESS STAGE 5 GRADE: 0 %
CHL CUP STRESS STAGE 5 SPEED: 0 mph
CSEPPMHR: 55 %
Estimated workload: 1 METS
Exercise duration (min): 1 min
Exercise duration (sec): 0 s
LV sys vol: 56 mL
LVDIAVOL: 1 mL (ref 62–150)
MPHR: 148 {beats}/min
Peak HR: 82 {beats}/min
Percent HR: 62 %
Rest HR: 75 {beats}/min
SDS: 5
SRS: 13
SSS: 14
Stage 1 Grade: 0 %
Stage 1 HR: 73 {beats}/min
Stage 2 Grade: 0 %
Stage 2 HR: 73 {beats}/min
Stage 3 Speed: 0 mph
Stage 4 Grade: 0 %
Stage 4 HR: 90 {beats}/min
Stage 5 DBP: 59 mmHg
Stage 5 HR: 85 {beats}/min
Stage 5 SBP: 140 mmHg
TID: 1.11

## 2016-05-12 LAB — BASIC METABOLIC PANEL
Anion gap: 3 — ABNORMAL LOW (ref 5–15)
BUN: 8 mg/dL (ref 6–20)
CHLORIDE: 105 mmol/L (ref 101–111)
CO2: 29 mmol/L (ref 22–32)
Calcium: 8.5 mg/dL — ABNORMAL LOW (ref 8.9–10.3)
Creatinine, Ser: 0.89 mg/dL (ref 0.61–1.24)
GFR calc Af Amer: >60 mL/min (ref >60)
GFR calc non Af Amer: >60 mL/min (ref >60)
Glucose, Bld: 95 mg/dL (ref 65–99)
POTASSIUM: 3.9 mmol/L (ref 3.5–5.1)
SODIUM: 137 mmol/L (ref 135–145)

## 2016-05-12 LAB — GLUCOSE, CAPILLARY
GLUCOSE-CAPILLARY: 82 mg/dL (ref 65–99)
Glucose-Capillary: 95 mg/dL (ref 65–99)
Glucose-Capillary: 90 mg/dL (ref 65–99)

## 2016-05-12 MED ORDER — TECHNETIUM TC 99M TETROFOSMIN IV KIT
30.0000 | PACK | Freq: Once | INTRAVENOUS | Status: AC | PRN
  Administered 2016-05-12: 32.612 via INTRAVENOUS

## 2016-05-12 MED ORDER — REGADENOSON 0.4 MG/5ML IV SOLN
0.4000 mg | Freq: Once | INTRAVENOUS | Status: AC
  Administered 2016-05-12: 0.4 mg via INTRAVENOUS

## 2016-05-12 MED ORDER — SODIUM CHLORIDE 0.9 % IV SOLN: INTRAVENOUS | Status: DC

## 2016-05-12 MED ORDER — PEG 3350-KCL-NA BICARB-NACL 420 G PO SOLR
4000.0000 mL | Freq: Once | ORAL | Status: DC
  Filled 2016-05-12: qty 4000

## 2016-05-12 MED ORDER — TECHNETIUM TC 99M TETROFOSMIN IV KIT
13.3400 | PACK | Freq: Once | INTRAVENOUS | Status: AC | PRN
  Administered 2016-05-12: 13.34 via INTRAVENOUS

## 2016-05-12 NOTE — Progress Notes (Signed)
[facility] [place] NAME: William [name]    MR#:  [id number]  DATE OF BIRTH:  [birth year]  SUBJECTIVE:   Patient Went for stress test this a.m.  REVIEW OF SYSTEMS:    Review of Systems  Constitutional: Negative for fever, chills weight loss HENT: Negative for ear pain, nosebleeds, congestion, facial swelling, rhinorrhea, neck pain, neck stiffness and ear discharge.   Respiratory: Negative for cough, shortness of breath, wheezing  Cardiovascular: Negative for chest pain (Chest pain is resolved), palpitations and leg swelling.  Gastrointestinal: Negative for heartburn, abdominal pain, vomiting, diarrhea or consitpation Positive for melena Genitourinary: Negative for dysuria, urgency, frequency, hematuria Musculoskeletal: Negative for back pain or joint pain Neurological: Negative for dizziness, seizures, syncope, focal weakness,  numbness and headaches.  Hematological: Does not bruise/bleed easily.  Psychiatric/Behavioral: Negative for hallucinations, confusion, dysphoric mood    Tolerating Diet:yes      DRUG ALLERGIES:  No Known Allergies  VITALS:  Blood pressure 138/77, pulse 76, temperature 98.4 F (36.9 C), temperature source Oral, resp. rate 18, height 5\' 10"  (1.778 m), weight 117.2 kg (258 lb 6.4 oz), SpO2 100 %.  PHYSICAL EXAMINATION:  Constitutional: Appears well-developed and well-nourished. No distress. HENT: Normocephalic. Marland Kitchen Oropharynx is clear and moist.  Eyes: Conjunctivae and EOM are normal. PERRLA, no scleral icterus.  Neck: Normal ROM. Neck supple. No JVD. No tracheal deviation. CVS: RRR, S1/S2 +, no murmurs, no gallops, no carotid bruit.  Pulmonary: Effort and breath sounds normal, no stridor, rhonchi, wheezes, rales.  Abdominal: Soft. BS +,  no distension, tenderness, rebound or guarding.  Musculoskeletal: Normal range of motion. No edema and no tenderness.  Neuro: Alert. CN 2-12 grossly intact. No focal  deficits. Skin: Skin is warm and dry. No rash noted. Psychiatric: Normal mood and affect.      LABORATORY PANEL:   CBC  Recent Labs Lab 05/12/16 0450  WBC 4.1  HGB 8.9*  HCT 27.5*  PLT 216   ------------------------------------------------------------------------------------------------------------------  Chemistries   Recent Labs Lab 05/11/16 0551 05/12/16 0450  NA 136 137  K 3.8 3.9  CL 104 105  CO2 26 29  GLUCOSE 86 95  BUN 9 8  CREATININE 0.84 0.89  CALCIUM 8.3* 8.5*  AST 16  --   ALT 12*  --   ALKPHOS 65  --   BILITOT 0.5  --    ------------------------------------------------------------------------------------------------------------------  Cardiac Enzymes  Recent Labs Lab 05/10/16 1759 05/10/16 2348 05/11/16 0551  TROPONINI 0.03* 0.03* <0.03   ------------------------------------------------------------------------------------------------------------------  RADIOLOGY:  Dg Chest 2 View  Result Date: 05/10/2016 CLINICAL DATA:  Central chest pain radiating to right arm. EXAM: CHEST  2 VIEW COMPARISON:  None. FINDINGS: Postsurgical changes from CABG noted. Cardiomediastinal silhouette is normal. Mediastinal contours appear intact. There is no evidence of focal airspace consolidation, pleural effusion or pneumothorax. Osseous structures are without acute abnormality. Soft tissues are grossly normal. IMPRESSION: No active cardiopulmonary disease. Electronically Signed   By: Fidela Salisbury M.D.   On: 05/10/2016 14:10     ASSESSMENT AND PLAN:    71 year old male with a history of coronary artery disease EF 45% and CABG who presented with chest pain and found to have guaiac-positive stools.  1. Chest pain with nondiagnostic ECG and negative troponins Follow-up on stress test results  2. Acute blood loss anemia with guaiac positive stools and melena: Continue Protonix Stress test negative patient will undergo colonoscopy and EGD in  a.m.  3.CAD/CABG: Continue aspirin, atorvastatin,  imdur, lisinopril, Coreg  4. Essential hypertension: Continue lisinopril, Imdur and Coreg  Management plans discussed with the patient and he is in agreement.  CODE STATUS: full  TOTAL TIME TAKING CARE OF THIS PATIENT: 32minutes.     POSSIBLE D/C tomorrow, DEPENDING ON CLINICAL CONDITION.   Montay Vanvoorhis M.D on 05/12/2016 at 11:17 AM  Between 7am to 6pm - Pager - [phone] After 6pm go to [email or web address] - password [email or web address] [place]  Office  [phone]  CC: Primary care physician; Kirk Ruths., MD  Note: This dictation was prepared with Dragon dictation along with smaller phrase technology. Any transcriptional errors that result from this process are unintentional.

## 2016-05-12 NOTE — Progress Notes (Signed)
The patient's Myoview showed mild inferior ischemia. Review of his prior cardiac catheter as well as his previous mild views showed no change. Would not proceed with cardiac catheter at this time. This is a stable Myoview. Would proceed with EGD to evaluate source of blood loss. Further cardiac treatment will be to continue medical therapy.

## 2016-05-12 NOTE — Progress Notes (Signed)
[facility] Cardiology  SUBJECTIVE: Patient denies recurrence of chest pain or shortness of breath.    Vitals:   05/11/16 0800 05/11/16 1406 05/11/16 1943 05/12/16 0500  BP: (!) 148/71 (!) 117/59 140/77 138/77  Pulse: 72 71 71 76  Resp: 16 (!) 2 18 18   Temp: 98.3 F (36.8 C) 98 F (36.7 C) 98.8 F (37.1 C) 98.4 F (36.9 C)  TempSrc: Oral Oral Oral Oral  SpO2: 100% 100% 100% 100%  Weight:    117.2 kg (258 lb 6.4 oz)  Height:         Intake/Output Summary (Last 24 hours) at 05/12/16 2330 Last data filed at 05/11/16 1842  Gross per 24 hour  Intake             1240 ml  Output                0 ml  Net             1240 ml      PHYSICAL EXAM  General: Well developed, well nourished, in no acute distress HEENT:  Normocephalic and atramatic Neck:  No JVD.  Lungs: Clear bilaterally to auscultation Heart: HRRR . Normal S1 and S2 without gallops or murmurs.  Abdomen: Bowel sounds are positive Msk:  Back normal, sitting upright in chair Extremities: No clubbing, cyanosis or edema.   Neuro: Alert and oriented X 3. Psych:  Good affect, responds appropriately   LABS: Basic Metabolic Panel:  Recent Labs  05/11/16 0551 05/12/16 0450  NA 136 137  K 3.8 3.9  CL 104 105  CO2 26 29  GLUCOSE 86 95  BUN 9 8  CREATININE 0.84 0.89  CALCIUM 8.3* 8.5*   Liver Function Tests:  Recent Labs  05/11/16 0551  AST 16  ALT 12*  ALKPHOS 65  BILITOT 0.5  PROT 6.9  ALBUMIN 3.4*   No results for input(s): LIPASE, AMYLASE in the last 72 hours. CBC:  Recent Labs  05/11/16 0551 05/12/16 0450  WBC 4.2 4.1  HGB 8.7* 8.9*  HCT 27.3* 27.5*  MCV 72.4* 71.1*  PLT 204 216   Cardiac Enzymes:  Recent Labs  05/10/16 1759 05/10/16 2348 05/11/16 0551  TROPONINI 0.03* 0.03* <0.03   BNP: Invalid input(s): POCBNP D-Dimer: No results for input(s): DDIMER in the last 72 hours. Hemoglobin A1C: No results for input(s): HGBA1C in the last 72 hours. Fasting Lipid Panel: No results for  input(s): CHOL, HDL, LDLCALC, TRIG, CHOLHDL, LDLDIRECT in the last 72 hours. Thyroid Function Tests: No results for input(s): TSH, T4TOTAL, T3FREE, THYROIDAB in the last 72 hours.  Invalid input(s): FREET3 Anemia Panel:  Recent Labs  05/11/16 0551  FERRITIN 5*  TIBC 365  IRON 14*    Dg Chest 2 View  Result Date: 05/10/2016 CLINICAL DATA:  Central chest pain radiating to right arm. EXAM: CHEST  2 VIEW COMPARISON:  None. FINDINGS: Postsurgical changes from CABG noted. Cardiomediastinal silhouette is normal. Mediastinal contours appear intact. There is no evidence of focal airspace consolidation, pleural effusion or pneumothorax. Osseous structures are without acute abnormality. Soft tissues are grossly normal. IMPRESSION: No active cardiopulmonary disease. Electronically Signed   By: Fidela Salisbury M.D.   On: 05/10/2016 14:10     Echo Pending  TELEMETRY: Normal sinus rhythm, 70 bpm  ASSESSMENT AND PLAN:  Principal Problem:   Unstable angina (HCC) Active Problems:   Anemia   Guaiac positive stools   ASCVD (arteriosclerotic cardiovascular disease)    1. Chest pain,  with known CAD, status post CABG, with most recent cardiac catheterization 08/2013 revealing patent bypass grafts, with moderate native vessel disease, and most recent [facility] 05/2015 with evidence of moderate inferolateral ischemia, which appears unchanged from prior, in the absence of chest pain, negative troponin, and non-diagnostic ECG.  2. Anemia with guaiac positive stool     Recommendations: 1. Agree with current therapy. 2. Defer full dose anticoagulation and cardiac catheterization at this time in light of patient's anemia and guaiac positive stool.  3. [place] today 4. Review echocardiogram 5. Further recommendations pending results of Lexiscan 6. EGD and colonoscopy Tuesday per GI   Clabe Seal, PA-C 05/12/2016 8:37 AM

## 2016-05-12 NOTE — Progress Notes (Signed)
I note he is due for a heart scan today - if negative for ischemia then will proceed with EGD+colonoscopy tomorrow  Dr [name]  Gastroenterology/Hepatology Pager: [phone]

## 2016-05-13 ENCOUNTER — Encounter: Payer: Self-pay | Admitting: Anesthesiology

## 2016-05-13 ENCOUNTER — Inpatient Hospital Stay: Payer: Medicare Other | Admitting: Anesthesiology

## 2016-05-13 ENCOUNTER — Encounter
Admission: EM | Disposition: A | Discharge status: Home / Self Care | Payer: Self-pay | Source: Home / Self Care | Attending: Internal Medicine

## 2016-05-13 DIAGNOSIS — D125 Benign neoplasm of sigmoid colon: Secondary | ICD-10-CM

## 2016-05-13 DIAGNOSIS — Z951 Presence of aortocoronary bypass graft: Secondary | ICD-10-CM | POA: Diagnosis not present

## 2016-05-13 DIAGNOSIS — K921 Melena: Secondary | ICD-10-CM | POA: Diagnosis not present

## 2016-05-13 DIAGNOSIS — R0789 Other chest pain: Secondary | ICD-10-CM | POA: Diagnosis present

## 2016-05-13 DIAGNOSIS — K2980 Duodenitis without bleeding: Secondary | ICD-10-CM | POA: Diagnosis not present

## 2016-05-13 DIAGNOSIS — G629 Polyneuropathy, unspecified: Secondary | ICD-10-CM | POA: Diagnosis not present

## 2016-05-13 DIAGNOSIS — D62 Acute posthemorrhagic anemia: Secondary | ICD-10-CM | POA: Diagnosis not present

## 2016-05-13 DIAGNOSIS — D509 Iron deficiency anemia, unspecified: Secondary | ICD-10-CM

## 2016-05-13 DIAGNOSIS — I10 Essential (primary) hypertension: Secondary | ICD-10-CM | POA: Diagnosis not present

## 2016-05-13 DIAGNOSIS — I2510 Atherosclerotic heart disease of native coronary artery without angina pectoris: Secondary | ICD-10-CM | POA: Diagnosis not present

## 2016-05-13 DIAGNOSIS — K317 Polyp of stomach and duodenum: Secondary | ICD-10-CM

## 2016-05-13 DIAGNOSIS — E669 Obesity, unspecified: Secondary | ICD-10-CM | POA: Diagnosis not present

## 2016-05-13 DIAGNOSIS — E785 Hyperlipidemia, unspecified: Secondary | ICD-10-CM | POA: Diagnosis not present

## 2016-05-13 DIAGNOSIS — K3189 Other diseases of stomach and duodenum: Secondary | ICD-10-CM | POA: Diagnosis not present

## 2016-05-13 DIAGNOSIS — K640 First degree hemorrhoids: Secondary | ICD-10-CM | POA: Diagnosis not present

## 2016-05-13 DIAGNOSIS — D122 Benign neoplasm of ascending colon: Secondary | ICD-10-CM | POA: Diagnosis not present

## 2016-05-13 DIAGNOSIS — K219 Gastro-esophageal reflux disease without esophagitis: Secondary | ICD-10-CM | POA: Diagnosis not present

## 2016-05-13 DIAGNOSIS — H5461 Unqualified visual loss, right eye, normal vision left eye: Secondary | ICD-10-CM | POA: Diagnosis not present

## 2016-05-13 DIAGNOSIS — I252 Old myocardial infarction: Secondary | ICD-10-CM | POA: Diagnosis not present

## 2016-05-13 DIAGNOSIS — Z6836 Body mass index (BMI) 36.0-36.9, adult: Secondary | ICD-10-CM | POA: Diagnosis not present

## 2016-05-13 HISTORY — PX: COLONOSCOPY WITH PROPOFOL: SHX5780

## 2016-05-13 HISTORY — PX: ESOPHAGOGASTRODUODENOSCOPY: SHX5428

## 2016-05-13 LAB — BASIC METABOLIC PANEL
Anion gap: 6 (ref 5–15)
BUN: 8 mg/dL (ref 6–20)
CHLORIDE: 104 mmol/L (ref 101–111)
CO2: 27 mmol/L (ref 22–32)
CREATININE: 0.8 mg/dL (ref 0.61–1.24)
Calcium: 8.6 mg/dL — ABNORMAL LOW (ref 8.9–10.3)
GFR calc non Af Amer: >60 mL/min (ref >60)
Glucose, Bld: 87 mg/dL (ref 65–99)
POTASSIUM: 3.7 mmol/L (ref 3.5–5.1)
Sodium: 137 mmol/L (ref 135–145)

## 2016-05-13 LAB — ECHOCARDIOGRAM COMPLETE
Area-P 1/2: 4.4 cm2
E decel time: 169 msec
EERAT: 7.44
FS: 40 % (ref 28–44)
HEIGHTINCHES: 70 in
IV/PV OW: 1.2
LADIAMINDEX: 1.84 cm/m2
LASIZE: 45 mm
LAVOLA4C: 50.9 mL
LEFT ATRIUM END SYS DIAM: 45 mm
LV E/e' medial: 7.44
LV E/e'average: 7.44
LV PW d: 12 mm — AB (ref 0.6–1.1)
LV e' LATERAL: 9.79 cm/s
Lateral S' vel: 8.16 cm/s
MV Dec: 169
MV Peak grad: 2 mmHg
MV pk E vel: 72.8 m/s
MVPKAVEL: 64.3 m/s
P 1/2 time: 50 ms
TAPSE: 16 mm
TDI e' lateral: 9.79
TDI e' medial: 6.31
WEIGHTICAEL: 4134.4 [oz_av]

## 2016-05-13 LAB — CBC
HEMATOCRIT: 28.5 % — AB (ref 40.0–52.0)
HEMOGLOBIN: 9.1 g/dL — AB (ref 13.0–18.0)
MCH: 22.7 pg — ABNORMAL LOW (ref 26.0–34.0)
MCHC: 32 g/dL (ref 32.0–36.0)
MCV: 70.9 fL — ABNORMAL LOW (ref 80.0–100.0)
Platelets: 234 10*3/uL (ref 150–440)
RBC: 4.02 MIL/uL — AB (ref 4.40–5.90)
RDW: 16.2 % — ABNORMAL HIGH (ref 11.5–14.5)
WBC: 5.3 10*3/uL (ref 3.8–10.6)

## 2016-05-13 LAB — HM COLONOSCOPY

## 2016-05-13 SURGERY — ESOPHAGOGASTRODUODENOSCOPY (EGD) WITH PROPOFOL
Anesthesia: General

## 2016-05-13 SURGERY — EGD (ESOPHAGOGASTRODUODENOSCOPY)
Anesthesia: General

## 2016-05-13 MED ORDER — PROPOFOL 500 MG/50ML IV EMUL
INTRAVENOUS | Status: DC | PRN
  Administered 2016-05-13: 140 ug/kg/min via INTRAVENOUS

## 2016-05-13 MED ORDER — FENTANYL CITRATE (PF) 100 MCG/2ML IJ SOLN: 25.0000 ug | INTRAMUSCULAR | Status: DC | PRN

## 2016-05-13 MED ORDER — ONDANSETRON HCL 4 MG/2ML IJ SOLN: 4.0000 mg | Freq: Once | INTRAMUSCULAR | Status: DC | PRN

## 2016-05-13 MED ORDER — PHENYLEPHRINE HCL 10 MG/ML IJ SOLN
INTRAMUSCULAR | Status: DC | PRN
  Administered 2016-05-13: 100 ug via INTRAVENOUS

## 2016-05-13 MED ORDER — PROPOFOL 500 MG/50ML IV EMUL
INTRAVENOUS | Status: AC
  Filled 2016-05-13: qty 50

## 2016-05-13 MED ORDER — LIDOCAINE HCL (PF) 2 % IJ SOLN
INTRAMUSCULAR | Status: AC
  Filled 2016-05-13: qty 2

## 2016-05-13 MED ORDER — PANTOPRAZOLE SODIUM 40 MG PO TBEC: 40.0000 mg | DELAYED_RELEASE_TABLET | Freq: Every day | ORAL | 0 refills | Status: DC

## 2016-05-13 MED ORDER — ISOSORBIDE MONONITRATE ER 30 MG PO TB24: 60.0000 mg | ORAL_TABLET | Freq: Every day | ORAL | 0 refills | Status: AC

## 2016-05-13 MED ORDER — GLYCOPYRROLATE 0.2 MG/ML IJ SOLN
INTRAMUSCULAR | Status: AC
  Filled 2016-05-13: qty 1

## 2016-05-13 MED ORDER — GLYCOPYRROLATE 0.2 MG/ML IJ SOLN
INTRAMUSCULAR | Status: DC | PRN
  Administered 2016-05-13: 0.2 mg via INTRAVENOUS

## 2016-05-13 MED ORDER — SODIUM CHLORIDE 0.9 % IV SOLN
INTRAVENOUS | Status: DC
  Administered 2016-05-13: 1000 mL via INTRAVENOUS

## 2016-05-13 MED ORDER — PROPOFOL 10 MG/ML IV BOLUS
INTRAVENOUS | Status: DC | PRN
  Administered 2016-05-13: 60 mg via INTRAVENOUS

## 2016-05-13 MED ORDER — LIDOCAINE HCL (CARDIAC) 20 MG/ML IV SOLN
INTRAVENOUS | Status: DC | PRN
  Administered 2016-05-13: 50 mg via INTRAVENOUS

## 2016-05-13 NOTE — Progress Notes (Signed)
Patient off the floor to endoscopy.

## 2016-05-13 NOTE — Op Note (Signed)
[facility] Patient Name: William [name] Procedure Date: 05/13/2016 7:58 AM MRN: [id number] Account #: [id number] Date of Birth: 01-20-1945 Admit Type: Inpatient Age: 71 Room: [facility] ENDO ROOM 1 Gender: Male Note Status: Finalized Procedure:            Upper GI endoscopy Indications:          Iron deficiency anemia Providers:            [name] MD, MD Referring MD:         Ocie Cornfield. Ouida Sills MD, MD (Referring MD) Medicines:            Monitored Anesthesia Care Complications:        No immediate complications. Procedure:            Pre-Anesthesia Assessment:                       - Prior to the procedure, a History and Physical was                        performed, and patient medications, allergies and                        sensitivities were reviewed. The patient's tolerance of                        previous anesthesia was reviewed.                       - The risks and benefits of the procedure and the                        sedation options and risks were discussed with the                        patient. All questions were answered and informed                        consent was obtained.                       - ASA Grade Assessment: III - A patient with severe                        systemic disease.                       After obtaining informed consent, the endoscope was                        passed under direct vision. Throughout the procedure,                        the patient's blood pressure, pulse, and oxygen                        saturations were monitored continuously. The Endoscope                        was introduced through the mouth, and advanced to the  third part of duodenum. The upper GI endoscopy was                        accomplished with ease. The patient tolerated the                        procedure well. Findings:      The esophagus was normal.      mild mucosal changes characterized by  appearance of thickening of two       folds converging towards the pylorus. Biopsies were taken with a cold       forceps for histology.      Localized moderate inflammation characterized by congestion (edema) and       erythema was found in the first portion of the duodenum. Biopsies were       taken with a cold forceps for histology.      The second portion of the duodenum was normal. Biopsies for histology       were taken with a cold forceps for evaluation of celiac disease.      A single 10 mm semi-sessile polyp was found in the third portion of the       duodenum. The sub mucosal lesion appeared having a normal appearing       mucosa no biopsies were taken Impression:           - Normal esophagus.                       - Mucosal changes in the pylorus. Biopsied.                       - Duodenitis. Biopsied.                       - Normal second portion of the duodenum. Biopsied.                       - A single duodenal polyp. Recommendation:       - Perform an upper endoscopic ultrasound (UEUS) in 6                        weeks.                       - Await pathology results.                       - Perform a colonoscopy today. Procedure Code(s):    --- Professional ---                       [phone], Esophagogastroduodenoscopy, flexible, transoral;                        with biopsy, single or multiple Diagnosis Code(s):    --- Professional ---                       K31.89, Other diseases of stomach and duodenum                       K29.80, Duodenitis without bleeding  K31.7, Polyp of stomach and duodenum                       D50.9, Iron deficiency anemia, unspecified CPT copyright 2016 American Medical Association. All rights reserved. The codes documented in this report are preliminary and upon coder review may  be revised to meet current compliance requirements. [name], MD [name] MD, MD 05/13/2016 8:16:59 AM This report has been signed  electronically. Number of Addenda: 0 Note Initiated On: 05/13/2016 7:58 AM      [facility]

## 2016-05-13 NOTE — Progress Notes (Signed)
Patient is being discharge home as per order, discharge instruction provided, iv removed tele removed, patient discharged  home

## 2016-05-13 NOTE — Anesthesia Preprocedure Evaluation (Addendum)
Anesthesia Evaluation  Patient identified by MRN, date of birth, ID band Patient awake    Reviewed: Allergy & Precautions, NPO status , Patient's Chart, lab work & pertinent test results, reviewed documented beta blocker date and time   Airway Mallampati: III  TM Distance: >3 FB     Dental  (+) Chipped, Upper Dentures, Partial Lower   Pulmonary shortness of breath and with exertion, sleep apnea ,    Pulmonary exam normal        Cardiovascular hypertension, Pt. on medications and Pt. on home beta blockers + angina with exertion + CAD, + Past MI and + Peripheral Vascular Disease  Normal cardiovascular exam     Neuro/Psych  Neuromuscular disease negative psych ROS   GI/Hepatic Neg liver ROS, PUD, GERD  Medicated,  Endo/Other  negative endocrine ROS  Renal/GU negative Renal ROS  negative genitourinary   Musculoskeletal  (+) Arthritis , Osteoarthritis,    Abdominal Normal abdominal exam  (+)   Peds negative pediatric ROS (+)  Hematology  (+) anemia ,   Anesthesia Other Findings   Reproductive/Obstetrics                            Anesthesia Physical Anesthesia Plan  ASA: III  Anesthesia Plan: General   Post-op Pain Management:    Induction: Intravenous  Airway Management Planned: Nasal Cannula  Additional Equipment:   Intra-op Plan:   Post-operative Plan:   Informed Consent: I have reviewed the patients History and Physical, chart, labs and discussed the procedure including the risks, benefits and alternatives for the proposed anesthesia with the patient or authorized representative who has indicated his/her understanding and acceptance.     Plan Discussed with: CRNA and Surgeon  Anesthesia Plan Comments:         Anesthesia Quick Evaluation

## 2016-05-13 NOTE — Anesthesia Post-op Follow-up Note (Cosign Needed)
Anesthesia QCDR form completed.        

## 2016-05-13 NOTE — H&P (Signed)
[name] MD [address], [place], [place] [address] Phone: [phone] Fax : [phone]  Primary Care Physician:  Kirk Ruths., MD Primary Gastroenterologist:  Dr. [name]   Pre-Procedure History & Physical: HPI:  William [name]. is a 71 y.o. male is here for an endoscopy and colonoscopy.   Past Medical History:  Diagnosis Date  . Angina   . Arthritis   . Blind right eye   . CAD (coronary artery disease)   . ED (erectile dysfunction)   . GERD (gastroesophageal reflux disease)   . Glaucoma    RT eye  . Goiter    removed  . 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 Hyperlipidemia   . Hypertension   . Myocardial infarction ([place]) 1999  . Peripheral neuropathy   . Previous back surgery    discs placed in cervical spine  . Rotator cuff syndrome   . Shoulder pain, left   . Sleep apnea, obstructive    uses BIPAP at night    Past Surgical History:  Procedure Laterality Date  . COLONOSCOPY  05/05/2011   Procedure: COLONOSCOPY;  Surgeon: Lear Ng, MD;  Location: [facility] ENDOSCOPY;  Service: Endoscopy;  Laterality: N/A;  . CORONARY ARTERY BYPASS GRAFT  1999  . ESOPHAGOGASTRODUODENOSCOPY  05/05/2011   Procedure: ESOPHAGOGASTRODUODENOSCOPY (EGD);  Surgeon: Lear Ng, MD;  Location: [facility] ENDOSCOPY;  Service: Endoscopy;  Laterality: N/A;  . EYE SURGERY  08/09   Cataract Removal, bilateral  . HERNIA REPAIR     Inguinal Herniorhaphy  . LEFT HEART CATHETERIZATION WITH CORONARY ANGIOGRAM N/A 08/24/2013   Procedure: LEFT HEART CATHETERIZATION WITH CORONARY ANGIOGRAM;  Surgeon: Troy Sine, MD;  Location: [facility] CATH LAB;  Service: Cardiovascular;  Laterality: N/A;  . LEFT HEART CATHETERIZATION WITH CORONARY/GRAFT ANGIOGRAM Right 03/09/2011   Procedure: LEFT HEART CATHETERIZATION WITH Beatrix Fetters;  Surgeon: Lorretta Harp, MD;  Location: [facility] CATH LAB;  Service: Cardiovascular;  Laterality: Right;  . SPINE SURGERY  10/09   C3-4, C4-5  diskectomy    Prior to Admission medications   Medication Sig Start Date End Date Taking? Authorizing Provider  acetaminophen (TYLENOL) 500 MG tablet Take 1,000 mg by mouth every 6 (six) hours as needed.   Yes Historical Provider, MD  amLODipine (NORVASC) 10 MG tablet TAKE ONE TABLET BY MOUTH ONE TIME DAILY  11/22/13  Yes Nischal Narendra, MD  aspirin EC 81 MG tablet Take 81 mg by mouth daily.   Yes Historical Provider, MD  atorvastatin (LIPITOR) 80 MG tablet Take 80 mg by mouth daily at 6 PM.    Yes Historical Provider, MD  carvedilol (COREG) 12.5 MG tablet Take 1 tablet (12.5 mg total) by mouth 2 (two) times daily with a meal.   Yes Blain Pais, MD  ferrous sulfate 325 (65 FE) MG tablet Take 325 mg by mouth daily with breakfast.   Yes Historical Provider, MD  isosorbide mononitrate (IMDUR) 30 MG 24 hr tablet Take 1 tablet (30 mg total) by mouth daily. Patient taking differently: Take 60 mg by mouth daily.  08/24/13  Yes Dorothy Spark, MD  lisinopril (PRINIVIL,ZESTRIL) 40 MG tablet Take one tablet by mouth one time daily 09/05/13  Yes Blain Pais, MD  nitroGLYCERIN (NITROSTAT) 0.4 MG SL tablet Place 1 tablet (0.4 mg total) under the tongue every 5 (five) minutes. Up to 3 times as needed for chest pain 08/08/13  Yes Dorothy Spark, MD  pravastatin (PRAVACHOL) 80 MG  tablet Take 1 tablet (80 mg total) by mouth daily. Patient not taking: Reported on 05/10/2016 03/15/13   Blain Pais, MD  traMADol Veatrice Bourbon) 50 MG tablet Take 1 to 2 tablets twice a day as needed for pain Patient not taking: Reported on 05/10/2016 03/19/15   Thurman Coyer, DO    Allergies as of 05/10/2016  . (No Known Allergies)    Family History  Problem Relation Age of Onset  . Heart attack Mother     Died at 78 with MI  . Heart attack Father     Died at unknown age with MI  . Heart disease Sister     CABG  . Heart attack Brother     Social History   Social History  . Marital status: Married     Spouse name: N/A  . Number of children: N/A  . Years of education: N/A   Occupational History  . Not on file.   Social History Main Topics  . Smoking status: Never Smoker  . Smokeless tobacco: Never Used  . Alcohol use Yes     Comment: once a month, a few at a time  . Drug use: No     Comment: in past, 30 years ago  . Sexual activity: Yes   Other Topics Concern  . Not on file   Social History Narrative  . No narrative on file    Review of Systems: See HPI, otherwise negative ROS  Physical Exam: BP 139/73   Pulse 72   Temp 97.8 F (36.6 C) (Tympanic)   Resp 18   Ht 5\' 10"  (1.778 m)   Wt 257 lb 0.9 oz (116.6 kg)   SpO2 99%   BMI 36.88 kg/m  General:   Alert,  pleasant and cooperative in NAD Head:  Normocephalic and atraumatic. Neck:  Supple; no masses or thyromegaly. Lungs:  Clear throughout to auscultation.    Heart:  Regular rate and rhythm. Abdomen:  Soft, nontender and nondistended. Normal bowel sounds, without guarding, and without rebound.   Neurologic:  Alert and  oriented x4;  grossly normal neurologically.  Impression/Plan: [name]. is here for an endoscopy and colonoscopy to be performed for iron deficiency anemia  Risks, benefits, limitations, and alternatives regarding  endoscopy and colonoscopy have been reviewed with the patient.  Questions have been answered.  All parties agreeable.   [name], MD  05/13/2016, 7:53 AM

## 2016-05-13 NOTE — Progress Notes (Signed)
GI   EGD+ colonoscopy no source for bleeding seen   Incidentally some duodenitis and a small duodenal polyp seen   Plan   1. Advance diet  2. IV iron  3. Outpatient capsule study and EUS to evaluate duodenal polyp  I will sign off.  Please call me if any further GI concerns or questions.  We would like to thank you for the opportunity to participate in the care of William [name]..   Dr [name]  Gastroenterology/Hepatology Pager: [phone]

## 2016-05-13 NOTE — Progress Notes (Signed)
[facility] NAME: William [name]    MR#:  [id number]  DATE OF BIRTH:  1945/02/28  SUBJECTIVE:   Patient plan for EGD and c/s this am No CP overnight Had prep and had BM through the night  REVIEW OF SYSTEMS:    Review of Systems  Constitutional: Negative for fever, chills weight loss HENT: Negative for ear pain, nosebleeds, congestion, facial swelling, rhinorrhea, neck pain, neck stiffness and ear discharge.   Respiratory: Negative for cough, shortness of breath, wheezing  Cardiovascular: Negative for chest pain (Chest pain is resolved), palpitations and leg swelling.  Gastrointestinal: Negative for heartburn, abdominal pain, vomiting, diarrhea or consitpation Positive for melena Genitourinary: Negative for dysuria, urgency, frequency, hematuria Musculoskeletal: Negative for back pain or joint pain Neurological: Negative for dizziness, seizures, syncope, focal weakness,  numbness and headaches.  Hematological: Does not bruise/bleed easily.  Psychiatric/Behavioral: Negative for hallucinations, confusion, dysphoric mood    Tolerating Diet:npo     DRUG ALLERGIES:  No Known Allergies  VITALS:  Blood pressure 139/73, pulse 72, temperature 97.8 F (36.6 C), temperature source Tympanic, resp. rate 18, height 5\' 10"  (1.778 m), weight 116.6 kg (257 lb 0.9 oz), SpO2 99 %.  PHYSICAL EXAMINATION:  Constitutional: Appears well-developed and well-nourished. No distress. HENT: Normocephalic. Marland Kitchen Oropharynx is clear and moist.  Eyes: Conjunctivae and EOM are normal. PERRLA, no scleral icterus.  Neck: Normal ROM. Neck supple. No JVD. No tracheal deviation. CVS: RRR, S1/S2 +, no murmurs, no gallops, no carotid bruit.  Pulmonary: Effort and breath sounds normal, no stridor, rhonchi, wheezes, rales.  Abdominal: Soft. BS +,  no distension, tenderness, rebound or guarding.  Musculoskeletal: Normal range of motion. No edema and no tenderness.   Neuro: Alert. CN 2-12 grossly intact. No focal deficits. Skin: Skin is warm and dry. No rash noted. Psychiatric: Normal mood and affect.      LABORATORY PANEL:   CBC  Recent Labs Lab 05/13/16 0436  WBC 5.3  HGB 9.1*  HCT 28.5*  PLT 234   ------------------------------------------------------------------------------------------------------------------  Chemistries   Recent Labs Lab 05/11/16 0551  05/13/16 0436  NA 136  < > 137  K 3.8  < > 3.7  CL 104  < > 104  CO2 26  < > 27  GLUCOSE 86  < > 87  BUN 9  < > 8  CREATININE 0.84  < > 0.80  CALCIUM 8.3*  < > 8.6*  AST 16  --   --   ALT 12*  --   --   ALKPHOS 65  --   --   BILITOT 0.5  --   --   < > = values in this interval not displayed. ------------------------------------------------------------------------------------------------------------------  Cardiac Enzymes  Recent Labs Lab 05/10/16 1759 05/10/16 2348 05/11/16 0551  TROPONINI 0.03* 0.03* <0.03   ------------------------------------------------------------------------------------------------------------------  RADIOLOGY:  Nm Myocar Multi W/spect W/wall Motion / Ef  Result Date: 05/12/2016  Defect 1: There is a small defect of moderate severity present in the basal inferolateral and mid inferolateral location.  Findings consistent with ischemia.  This is an intermediate risk study.  The left ventricular ejection fraction is normal (55-65%).  There was no ST segment deviation noted during stress.  Small area of mild to mod inferolateral ischemia.     ASSESSMENT AND PLAN:    71 year old male with a history of coronary artery disease EF 45% and CABG who presented with chest pain and found to have guaiac-positive stools.  1. Chest pain with nondiagnostic ECG and negative troponins Myoview showed mild inferior ischemia. Review of his prior cardiac catheter as well as his previous mild views showed no change as per Dr Ubaldo Glassing. Patient ok for planned  EGD and c/s this am.   2. Acute blood loss anemia with guaiac positive stools and melena: Continue Protonix Follow up on today'scolonoscopy and EGD in a.m.  3.CAD/CABG: Continue aspirin, atorvastatin, imdur, lisinopril, Coreg  4. Essential hypertension: Continue lisinopril, Imdur and Coreg  Management plans discussed with the patient and he is in agreement.  CODE STATUS: full  TOTAL TIME TAKING CARE OF THIS PATIENT: 21 minutes.   Care d/w dr Vicente Males and dr Ubaldo Glassing yesterday   POSSIBLE D/C today pending tests today   Avin Gibbons M.D on 05/13/2016 at 7:58 AM  Between 7am to 6pm - Pager - [phone] After 6pm go to [email or web address] - password [email or web address] [facility]  Office  [phone]  CC: Primary care physician; Kirk Ruths., MD  Note: This dictation was prepared with Dragon dictation along with smaller phrase technology. Any transcriptional errors that result from this process are unintentional.

## 2016-05-13 NOTE — Discharge Summary (Signed)
[facility] NAME: William [name]    MR#:  [id number]  DATE OF BIRTH:  14-Dec-1945  DATE OF ADMISSION:  05/10/2016 ADMITTING PHYSICIAN: Idelle Crouch, MD  DATE OF DISCHARGE: 05/13/2016  PRIMARY CARE PHYSICIAN: Kirk Ruths., MD    ADMISSION DIAGNOSIS:  Unstable angina ([place]) [I20.0] Lower GI bleed [K92.2]  DISCHARGE DIAGNOSIS:  Principal Problem:   Unstable angina (HCC) Active Problems:   Anemia   Guaiac positive stools   ASCVD (arteriosclerotic cardiovascular disease)   SECONDARY DIAGNOSIS:   Past Medical History:  Diagnosis Date  . Angina   . Arthritis   . Blind right eye   . CAD (coronary artery disease)   . ED (erectile dysfunction)   . GERD (gastroesophageal reflux disease)   . Glaucoma    RT eye  . Goiter    removed  . 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 Hyperlipidemia   . Hypertension   . Myocardial infarction ([place]) 1999  . Peripheral neuropathy   . Previous back surgery    discs placed in cervical spine  . Rotator cuff syndrome   . Shoulder pain, left   . Sleep apnea, obstructive    uses BIPAP at night    HOSPITAL COURSE:  71 year old male with a history of coronary artery disease EF 45% and CABG who presented with chest pain and found to have guaiac-positive stools.  1. Chest pain with nondiagnostic ECG and negative troponins Myoview showed mild inferior ischemia. Review of his prior cardiac catheter as well as his previous mild views showed no change as per Dr Ubaldo Glassing. Patient was cleared for EGD and colonoscopy..   2. Acute blood loss anemia with guaiac positive stools and melena: Patient underwent EGD and colonoscopy which showed no source for bleeding. There was some duodenitis and a small duodenal polyp that was seen. GI is recommended outpatient Study and EUS to evaluate duodenal polyp. Patient will have outpatient follow-up in 1-2 weeks with GI. Patient will continue on PPI for  duodenitis.   3.CAD/CABG: Continue aspirin, atorvastatin, imdur, lisinopril, Coreg  4. Essential hypertension: Continue lisinopril, Imdur and Coreg   DISCHARGE CONDITIONS AND DIET:   Stable for discharge on heart healthy diet  CONSULTS OBTAINED:  Treatment Team:  Isaias Cowman, MD Rohini Raeanne Gathers, MD Teodoro Spray, MD  DRUG ALLERGIES:  No Known Allergies  DISCHARGE MEDICATIONS:   Current Discharge Medication List    START taking these medications   Details  pantoprazole (PROTONIX) 40 MG tablet Take 1 tablet (40 mg total) by mouth daily. Qty: 30 tablet, Refills: 0      CONTINUE these medications which have CHANGED   Details  isosorbide mononitrate (IMDUR) 30 MG 24 hr tablet Take 2 tablets (60 mg total) by mouth daily. Qty: 30 tablet, Refills: 0      CONTINUE these medications which have NOT CHANGED   Details  acetaminophen (TYLENOL) 500 MG tablet Take 1,000 mg by mouth every 6 (six) hours as needed.    amLODipine (NORVASC) 10 MG tablet TAKE ONE TABLET BY MOUTH ONE TIME DAILY  Qty: 30 tablet, Refills: 0    aspirin EC 81 MG tablet Take 81 mg by mouth daily.    atorvastatin (LIPITOR) 80 MG tablet Take 80 mg by mouth daily at 6 PM.     carvedilol (COREG) 12.5 MG tablet Take 1 tablet (12.5 mg total) by mouth 2 (two) times daily with a meal. Qty: 60  tablet, Refills: 6    ferrous sulfate 325 (65 FE) MG tablet Take 325 mg by mouth daily with breakfast.    lisinopril (PRINIVIL,ZESTRIL) 40 MG tablet Take one tablet by mouth one time daily Qty: 90 tablet, Refills: 9    nitroGLYCERIN (NITROSTAT) 0.4 MG SL tablet Place 1 tablet (0.4 mg total) under the tongue every 5 (five) minutes. Up to 3 times as needed for chest pain Qty: 25 tablet, Refills: 3    pravastatin (PRAVACHOL) 80 MG tablet Take 1 tablet (80 mg total) by mouth daily. Qty: 90 tablet, Refills: 4    traMADol (ULTRAM) 50 MG tablet Take 1 to 2 tablets twice a day as needed for pain Qty: 60 tablet,  Refills: 1          Today   CHIEF COMPLAINT:  Patient is doing well this point. No chest pain or shortness of breath. No dark colored stools   VITAL SIGNS:  Blood pressure 129/65, pulse 78, temperature (!) 96.6 F (35.9 C), temperature source Tympanic, resp. rate 14, height 5\' 10"  (1.778 m), weight 116.6 kg (257 lb 0.9 oz), SpO2 100 %.   REVIEW OF SYSTEMS:  Review of Systems  Constitutional: Negative.  Negative for chills, fever and malaise/fatigue.  HENT: Negative.  Negative for ear discharge, ear pain, hearing loss, nosebleeds and sore throat.   Eyes: Negative.  Negative for blurred vision and pain.  Respiratory: Negative.  Negative for cough, hemoptysis, shortness of breath and wheezing.   Cardiovascular: Negative.  Negative for chest pain, palpitations and leg swelling.  Gastrointestinal: Negative.  Negative for abdominal pain, blood in stool, diarrhea, nausea and vomiting.  Genitourinary: Negative.  Negative for dysuria.  Musculoskeletal: Negative.  Negative for back pain.  Skin: Negative.   Neurological: Negative for dizziness, tremors, speech change, focal weakness, seizures and headaches.  Endo/Heme/Allergies: Negative.  Does not bruise/bleed easily.  Psychiatric/Behavioral: Negative.  Negative for depression, hallucinations and suicidal ideas.     PHYSICAL EXAMINATION:  GENERAL:  71 y.o.-year-old patient lying in the bed with no acute distress.  NECK:  Supple, no jugular venous distention. No thyroid enlargement, no tenderness.  LUNGS: Normal breath sounds bilaterally, no wheezing, rales,rhonchi  No use of accessory muscles of respiration.  CARDIOVASCULAR: S1, S2 normal. No murmurs, rubs, or gallops.  ABDOMEN: Soft, non-tender, non-distended. Bowel sounds present. No organomegaly or mass.  EXTREMITIES: No pedal edema, cyanosis, or clubbing.  PSYCHIATRIC: The patient is alert and oriented x 3.  SKIN: No obvious rash, lesion, or ulcer.   DATA REVIEW:    CBC  Recent Labs Lab 05/13/16 0436  WBC 5.3  HGB 9.1*  HCT 28.5*  PLT 234    Chemistries   Recent Labs Lab 05/11/16 0551  05/13/16 0436  NA 136  < > 137  K 3.8  < > 3.7  CL 104  < > 104  CO2 26  < > 27  GLUCOSE 86  < > 87  BUN 9  < > 8  CREATININE 0.84  < > 0.80  CALCIUM 8.3*  < > 8.6*  AST 16  --   --   ALT 12*  --   --   ALKPHOS 65  --   --   BILITOT 0.5  --   --   < > = values in this interval not displayed.  Cardiac Enzymes  Recent Labs Lab 05/10/16 1759 05/10/16 2348 05/11/16 0551  TROPONINI 0.03* 0.03* <0.03    Microbiology Results  @MICRORSLT48 @  RADIOLOGY:  Nm Myocar Multi W/spect W/wall Motion / Ef  Result Date: 05/12/2016  Defect 1: There is a small defect of moderate severity present in the basal inferolateral and mid inferolateral location.  Findings consistent with ischemia.  This is an intermediate risk study.  The left ventricular ejection fraction is normal (55-65%).  There was no ST segment deviation noted during stress.  Small area of mild to mod inferolateral ischemia.      Current Discharge Medication List    START taking these medications   Details  pantoprazole (PROTONIX) 40 MG tablet Take 1 tablet (40 mg total) by mouth daily. Qty: 30 tablet, Refills: 0      CONTINUE these medications which have CHANGED   Details  isosorbide mononitrate (IMDUR) 30 MG 24 hr tablet Take 2 tablets (60 mg total) by mouth daily. Qty: 30 tablet, Refills: 0      CONTINUE these medications which have NOT CHANGED   Details  acetaminophen (TYLENOL) 500 MG tablet Take 1,000 mg by mouth every 6 (six) hours as needed.    amLODipine (NORVASC) 10 MG tablet TAKE ONE TABLET BY MOUTH ONE TIME DAILY  Qty: 30 tablet, Refills: 0    aspirin EC 81 MG tablet Take 81 mg by mouth daily.    atorvastatin (LIPITOR) 80 MG tablet Take 80 mg by mouth daily at 6 PM.     carvedilol (COREG) 12.5 MG tablet Take 1 tablet (12.5 mg total) by mouth 2 (two) times  daily with a meal. Qty: 60 tablet, Refills: 6    ferrous sulfate 325 (65 FE) MG tablet Take 325 mg by mouth daily with breakfast.    lisinopril (PRINIVIL,ZESTRIL) 40 MG tablet Take one tablet by mouth one time daily Qty: 90 tablet, Refills: 9    nitroGLYCERIN (NITROSTAT) 0.4 MG SL tablet Place 1 tablet (0.4 mg total) under the tongue every 5 (five) minutes. Up to 3 times as needed for chest pain Qty: 25 tablet, Refills: 3    pravastatin (PRAVACHOL) 80 MG tablet Take 1 tablet (80 mg total) by mouth daily. Qty: 90 tablet, Refills: 4    traMADol (ULTRAM) 50 MG tablet Take 1 to 2 tablets twice a day as needed for pain Qty: 60 tablet, Refills: 1           Management plans discussed with the patient and he is in agreement. Stable for discharge home  Patient should follow up with pcp  CODE STATUS:     Code Status Orders        Start     Ordered   05/10/16 1828  Full code  Continuous     05/10/16 1827    Code Status History    Date Active Date Inactive Code Status Order ID Comments User Context   08/24/2013  3:07 PM 08/24/2013  8:14 PM Full Code [id number]  Troy Sine, MD Inpatient   03/09/2011 12:33 PM 03/11/2011  3:39 PM Full Code [id number]  Gaetano Net, RN Inpatient      TOTAL TIME TAKING CARE OF THIS PATIENT: 37 minutes.    Note: This dictation was prepared with Dragon dictation along with smaller phrase technology. Any transcriptional errors that result from this process are unintentional.  Shin Lamour M.D on 05/13/2016 at 10:48 AM  Between 7am to 6pm - Pager - [phone] After 6pm go to [email or web address] - password [email or web address]  Office  [phone]  CC: Primary care physician; Kirk Ruths., MD

## 2016-05-13 NOTE — Op Note (Signed)
[facility] Gastroenterology Patient Name: William [name] Procedure Date: 05/13/2016 7:57 AM MRN: [id number] Account #: [id number] Date of Birth: May 28, 1945 Admit Type: Inpatient Age: 71 Room: [facility] ENDO ROOM 1 Gender: Male Note Status: Finalized Procedure:            Colonoscopy Indications:          Iron deficiency anemia Providers:            [name] MD, MD Referring MD:         Ocie Cornfield. Ouida Sills MD, MD (Referring MD) Medicines:            Monitored Anesthesia Care Complications:        No immediate complications. Procedure:            Pre-Anesthesia Assessment:                       - Prior to the procedure, a History and Physical was                        performed, and patient medications, allergies and                        sensitivities were reviewed. The patient's tolerance of                        previous anesthesia was reviewed.                       - The risks and benefits of the procedure and the                        sedation options and risks were discussed with the                        patient. All questions were answered and informed                        consent was obtained.                       - ASA Grade Assessment: III - A patient with severe                        systemic disease.                       After obtaining informed consent, the colonoscope was                        passed under direct vision. Throughout the procedure,                        the patient's blood pressure, pulse, and oxygen                        saturations were monitored continuously. The                        Colonoscope was introduced through the anus and  advanced to the the terminal ileum. The colonoscopy was                        performed with ease. The patient tolerated the                        procedure well. The quality of the bowel preparation                        was good. Findings:      The perianal and  digital rectal examinations were normal.      The terminal ileum appeared normal.      Non-bleeding internal hemorrhoids were found during retroflexion. The       hemorrhoids were small and Grade I (internal hemorrhoids that do not       prolapse).      Four sessile polyps were found in the sigmoid colon and ascending colon.       The polyps were 3 to 6 mm in size. These polyps were removed with a cold       snare. Resection and retrieval were complete.      The exam was otherwise without abnormality on direct and retroflexion       views. Impression:           - The examined portion of the ileum was normal.                       - Non-bleeding internal hemorrhoids.                       - Four 3 to 6 mm polyps in the sigmoid colon and in the                        ascending colon, removed with a cold snare. Resected                        and retrieved.                       - The examination was otherwise normal on direct and                        retroflexion views. Recommendation:       - Return patient to hospital ward for ongoing care.                       - Advance diet as tolerated.                       - Continue present medications.                       - Await pathology results.                       - To visualize the small bowel, perform video capsule                        endoscopy at appointment to be scheduled.                       -  Out patient GI follow up to set up capsule study and                        EUS for duodenal polyp evaluation. No cause found for                        anemia so far in evaluation. Procedure Code(s):    --- Professional ---                       [phone], Colonoscopy, flexible; with removal of tumor(s),                        polyp(s), or other lesion(s) by snare technique Diagnosis Code(s):    --- Professional ---                       K64.0, First degree hemorrhoids                       D12.5, Benign neoplasm of sigmoid colon                        D12.2, Benign neoplasm of ascending colon                       D50.9, Iron deficiency anemia, unspecified CPT copyright 2016 American Medical Association. All rights reserved. The codes documented in this report are preliminary and upon coder review may  be revised to meet current compliance requirements. [name], MD [name] MD, MD 05/13/2016 8:35:35 AM This report has been signed electronically. Number of Addenda: 0 Note Initiated On: 05/13/2016 7:57 AM Scope Withdrawal Time: 0 hours 10 minutes 56 seconds  Total Procedure Duration: 0 hours 13 minutes 15 seconds       [facility]

## 2016-05-13 NOTE — Transfer of Care (Signed)
Immediate Anesthesia Transfer of Care Note  Patient: William [name].  Procedure(s) Performed: Procedure(s): ESOPHAGOGASTRODUODENOSCOPY (EGD) (N/A) COLONOSCOPY WITH PROPOFOL (N/A)  Patient Location: PACU and Endoscopy Unit  Anesthesia Type:General  Level of Consciousness: sedated  Airway & Oxygen Therapy: Patient Spontanous Breathing and Patient connected to nasal cannula oxygen  Post-op Assessment: Report given to RN and Post -op Vital signs reviewed and stable  Post vital signs: Reviewed and stable  Last Vitals:  Vitals:   05/13/16 0751 05/13/16 0839  BP: 139/73 99/64  Pulse: 72 90  Resp: 18 (!) 26  Temp: 36.6 C (!) 97.5 C    Complications: No apparent anesthesia complications

## 2016-05-13 NOTE — Anesthesia Procedure Notes (Signed)
Date/Time: 05/13/2016 7:59 AM Performed by: Doreen Salvage Pre-anesthesia Checklist: Patient identified, Emergency Drugs available, Suction available and Patient being monitored Patient Re-evaluated:Patient Re-evaluated prior to inductionOxygen Delivery Method: Nasal cannula Intubation Type: IV induction Dental Injury: Teeth and Oropharynx as per pre-operative assessment  Comments: Nasal cannula with etCO2 monitoring

## 2016-05-13 NOTE — Anesthesia Postprocedure Evaluation (Signed)
Anesthesia Post Note  Patient: Engelbert Sevin.  Procedure(s) Performed: Procedure(s) (LRB): ESOPHAGOGASTRODUODENOSCOPY (EGD) (N/A) COLONOSCOPY WITH PROPOFOL (N/A)  Patient location during evaluation: PACU Anesthesia Type: General Level of consciousness: awake and alert and oriented Pain management: pain level controlled Respiratory status: spontaneous breathing Cardiovascular status: blood pressure returned to baseline Anesthetic complications: no     Last Vitals:  Vitals:   05/13/16 0859 05/13/16 0909  BP: 125/79 129/65  Pulse: 82 78  Resp: (!) 28 14  Temp:      Last Pain:  Vitals:   05/13/16 0909  TempSrc:   PainSc: 0-No pain                 Keyasia Jolliff

## 2016-05-14 ENCOUNTER — Telehealth: Payer: Self-pay | Admitting: Gastroenterology

## 2016-05-14 ENCOUNTER — Encounter: Payer: Self-pay | Admitting: Gastroenterology

## 2016-05-14 ENCOUNTER — Ambulatory Visit (INDEPENDENT_AMBULATORY_CARE_PROVIDER_SITE_OTHER): Payer: Medicare Other | Admitting: Gastroenterology

## 2016-05-14 ENCOUNTER — Other Ambulatory Visit: Payer: Self-pay

## 2016-05-14 VITALS — BP 130/81 | HR 77 | Temp 98.1°F | Resp 16 | Ht 70.0 in | Wt 261.0 lb

## 2016-05-14 DIAGNOSIS — D509 Iron deficiency anemia, unspecified: Secondary | ICD-10-CM

## 2016-05-14 LAB — TYPE AND SCREEN
ABO/RH(D): O POS
Antibody Screen: NEGATIVE
UNIT DIVISION: 0
UNIT DIVISION: 0

## 2016-05-14 LAB — BPAM RBC
BLOOD PRODUCT EXPIRATION DATE: 201805172359
Blood Product Expiration Date: 201805242359
Unit Type and Rh: 5100
Unit Type and Rh: 5100

## 2016-05-14 LAB — SURGICAL PATHOLOGY

## 2016-05-14 NOTE — Telephone Encounter (Signed)
05/15/15 [employer] website NO prior auth required. Decision ID #: [id number].

## 2016-05-14 NOTE — Telephone Encounter (Signed)
Patient left a voice message that he needs to reschedule a procedure that is on 05/22/16 Please call

## 2016-05-14 NOTE — Progress Notes (Signed)
Primary Care Physician: Kirk Ruths., MD  Primary Gastroenterologist:  Dr. [name]   Chief Complaint  Patient presents with  . Hospitalization Follow-up  . Anemia    HPI: William Werber. is a 71 y.o. male is here today for a hospital follow up .   Summary of history:   GI was consulted to see the patient when admitted on 05/10/16 with anemia . He was admitted with chest pain and shortness of breath and found to have a low Hb of 9.1 grams On admission his ferritin was 5 . He had EGD and colonoscopy by Dr Michail Sermon in 2013 for severe iron deficiency anemia when he was admitted at [facility]. EGD was negative, colonoscopy revealed polyp only. I performed an EGD and colonoscopy yesterday which shouwed no bleeding,I found a small duodenal polyp which appeared like a submucosal lesion, duodenitis and thickened fold at the antrum. Biopsies were taken which are in process. I found 4 adenomatous appearing polyps in the colon which were resected.    He was discharged yesterday .  No prior abdominal surgeries, stool is brown in color, not black. No issues today .    Current Outpatient Prescriptions  Medication Sig Dispense Refill  . amLODipine (NORVASC) 10 MG tablet TAKE ONE TABLET BY MOUTH ONE TIME DAILY  30 tablet 0  . aspirin EC 81 MG tablet Take 81 mg by mouth daily.    Marland Kitchen atorvastatin (LIPITOR) 80 MG tablet Take 80 mg by mouth daily at 6 PM.     . carvedilol (COREG) 12.5 MG tablet Take 1 tablet (12.5 mg total) by mouth 2 (two) times daily with a meal. 60 tablet 6  . ferrous sulfate 325 (65 FE) MG tablet Take 325 mg by mouth daily with breakfast.    . isosorbide mononitrate (IMDUR) 30 MG 24 hr tablet Take 2 tablets (60 mg total) by mouth daily. 30 tablet 0  . lisinopril (PRINIVIL,ZESTRIL) 40 MG tablet Take one tablet by mouth one time daily 90 tablet 9  . nitroGLYCERIN (NITROSTAT) 0.4 MG SL tablet Place 1 tablet (0.4 mg total) under the tongue every 5 (five) minutes. Up  to 3 times as needed for chest pain 25 tablet 3  . pantoprazole (PROTONIX) 40 MG tablet Take 1 tablet (40 mg total) by mouth daily. 30 tablet 0  . pravastatin (PRAVACHOL) 80 MG tablet Take 1 tablet (80 mg total) by mouth daily. 90 tablet 4  . traMADol (ULTRAM) 50 MG tablet Take 1 to 2 tablets twice a day as needed for pain 60 tablet 1  . acetaminophen (TYLENOL) 500 MG tablet Take 1,000 mg by mouth every 6 (six) hours as needed.     No current facility-administered medications for this visit.     Allergies as of 05/14/2016  . (No Known Allergies)    ROS:  General: Negative for anorexia, weight loss, fever, chills, fatigue, weakness. ENT: Negative for hoarseness, difficulty swallowing , nasal congestion. CV: Negative for chest pain, angina, palpitations, dyspnea on exertion, peripheral edema.  Respiratory: Negative for dyspnea at rest, dyspnea on exertion, cough, sputum, wheezing.  GI: See history of present illness. GU:  Negative for dysuria, hematuria, urinary incontinence, urinary frequency, nocturnal urination.  Endo: Negative for unusual weight change.    Physical Examination:   BP 130/81   Pulse 77   Temp 98.1 F (36.7 C) (Oral)   Resp 16   Ht 5\' 10"  (1.778 m)   Wt 261 lb (118.4 kg)  BMI 37.45 kg/m   General: Well-nourished, well-developed in no acute distress.  Eyes: No icterus. Conjunctivae pink. Mouth: Oropharyngeal mucosa moist and pink , no lesions erythema or exudate. Lungs: Clear to auscultation bilaterally. Non-labored. Heart: Regular rate and rhythm, no murmurs rubs or gallops.  Abdomen: Bowel sounds are normal, nontender, nondistended, no hepatosplenomegaly or masses, no abdominal bruits or hernia , no rebound or guarding.   Extremities: No lower extremity edema. No clubbing or deformities. Neuro: Alert and oriented x 3.  Grossly intact. Skin: Warm and dry, no jaundice.   Psych: Alert and cooperative, normal mood and affect.   Imaging Studies: Dg Chest 2  View  Result Date: 05/10/2016 CLINICAL DATA:  Central chest pain radiating to right arm. EXAM: CHEST  2 VIEW COMPARISON:  None. FINDINGS: Postsurgical changes from CABG noted. Cardiomediastinal silhouette is normal. Mediastinal contours appear intact. There is no evidence of focal airspace consolidation, pleural effusion or pneumothorax. Osseous structures are without acute abnormality. Soft tissues are grossly normal. IMPRESSION: No active cardiopulmonary disease. Electronically Signed   By: Fidela Salisbury M.D.   On: 05/10/2016 14:10   Nm Myocar Multi W/spect W/wall Motion / Ef  Result Date: 05/12/2016  Defect 1: There is a small defect of moderate severity present in the basal inferolateral and mid inferolateral location.  Findings consistent with ischemia.  This is an intermediate risk study.  The left ventricular ejection fraction is normal (55-65%).  There was no ST segment deviation noted during stress.  Small area of mild to mod inferolateral ischemia.    Assessment and Plan:   William [name]. is a 71 y.o. y/o male here to follow up for iron deficiency anemia post hospital discharge. No significant findings in EGD+colonoscopy .   Plan  1. Capsule study of the small bowel  2. Celiac disease panel, urine analysis ,b12,folate, CBC next wek  3. EUS to evaluate duodneal polyp after capsule study which I will schedule after next visit.   I have discussed alternative options, risks & benefits,  which include, but are not limited to, bleeding, infection, perforation  The patient agrees with this plan & written consent will be obtained.    Dr [name]  MD Follow up in 4-6 weeks

## 2016-05-19 ENCOUNTER — Encounter: Payer: Self-pay | Admitting: Gastroenterology

## 2016-05-19 NOTE — Progress Notes (Signed)
Biopsy results

## 2016-05-20 ENCOUNTER — Telehealth: Payer: Self-pay

## 2016-05-20 NOTE — Telephone Encounter (Signed)
-----   Message from [name], MD sent at 05/19/2016 10:30 AM EDT ----- Letters sent for biopsy - he needs EUS to evaluate duodenal polyp

## 2016-05-20 NOTE — Telephone Encounter (Signed)
Advised patient of results per Dr. Vicente Males.   Patient states, "he will let me know" concerning scheduling EUS for duodenal polyp evaluation.

## 2016-06-23 DIAGNOSIS — R7303 Prediabetes: Secondary | ICD-10-CM | POA: Insufficient documentation

## 2016-06-23 DIAGNOSIS — R739 Hyperglycemia, unspecified: Secondary | ICD-10-CM | POA: Insufficient documentation

## 2016-07-08 ENCOUNTER — Encounter: Admission: RE | Payer: Self-pay | Source: Ambulatory Visit

## 2016-07-08 ENCOUNTER — Ambulatory Visit: Admission: RE | Admit: 2016-07-08 | Payer: Medicare Other | Source: Ambulatory Visit | Admitting: Gastroenterology

## 2016-07-08 SURGERY — IMAGING PROCEDURE, GI TRACT, INTRALUMINAL, VIA CAPSULE

## 2016-10-14 ENCOUNTER — Inpatient Hospital Stay: Payer: Medicare Other

## 2016-10-14 ENCOUNTER — Inpatient Hospital Stay: Payer: Medicare Other | Attending: Oncology | Admitting: Oncology

## 2016-10-14 ENCOUNTER — Encounter: Payer: Self-pay | Admitting: Oncology

## 2016-10-14 VITALS — BP 147/82 | HR 71 | Temp 97.0°F | Ht 70.0 in | Wt 256.2 lb

## 2016-10-14 DIAGNOSIS — I2510 Atherosclerotic heart disease of native coronary artery without angina pectoris: Secondary | ICD-10-CM | POA: Diagnosis not present

## 2016-10-14 DIAGNOSIS — I200 Unstable angina: Secondary | ICD-10-CM | POA: Insufficient documentation

## 2016-10-14 DIAGNOSIS — I10 Essential (primary) hypertension: Secondary | ICD-10-CM | POA: Insufficient documentation

## 2016-10-14 DIAGNOSIS — R52 Pain, unspecified: Secondary | ICD-10-CM | POA: Insufficient documentation

## 2016-10-14 DIAGNOSIS — D500 Iron deficiency anemia secondary to blood loss (chronic): Secondary | ICD-10-CM | POA: Insufficient documentation

## 2016-10-14 DIAGNOSIS — K2980 Duodenitis without bleeding: Secondary | ICD-10-CM | POA: Diagnosis not present

## 2016-10-14 DIAGNOSIS — Z8619 Personal history of other infectious and parasitic diseases: Secondary | ICD-10-CM | POA: Diagnosis not present

## 2016-10-14 DIAGNOSIS — G629 Polyneuropathy, unspecified: Secondary | ICD-10-CM | POA: Diagnosis not present

## 2016-10-14 DIAGNOSIS — D649 Anemia, unspecified: Secondary | ICD-10-CM

## 2016-10-14 DIAGNOSIS — D509 Iron deficiency anemia, unspecified: Secondary | ICD-10-CM | POA: Insufficient documentation

## 2016-10-14 DIAGNOSIS — N529 Male erectile dysfunction, unspecified: Secondary | ICD-10-CM | POA: Insufficient documentation

## 2016-10-14 DIAGNOSIS — I252 Old myocardial infarction: Secondary | ICD-10-CM | POA: Diagnosis not present

## 2016-10-14 DIAGNOSIS — R5383 Other fatigue: Secondary | ICD-10-CM | POA: Diagnosis not present

## 2016-10-14 DIAGNOSIS — Z79899 Other long term (current) drug therapy: Secondary | ICD-10-CM | POA: Insufficient documentation

## 2016-10-14 DIAGNOSIS — K219 Gastro-esophageal reflux disease without esophagitis: Secondary | ICD-10-CM | POA: Diagnosis not present

## 2016-10-14 DIAGNOSIS — E785 Hyperlipidemia, unspecified: Secondary | ICD-10-CM | POA: Diagnosis not present

## 2016-10-14 DIAGNOSIS — Z86010 Personal history of colonic polyps: Secondary | ICD-10-CM | POA: Diagnosis not present

## 2016-10-14 DIAGNOSIS — Z806 Family history of leukemia: Secondary | ICD-10-CM | POA: Insufficient documentation

## 2016-10-14 DIAGNOSIS — G4730 Sleep apnea, unspecified: Secondary | ICD-10-CM | POA: Insufficient documentation

## 2016-10-14 HISTORY — DX: Iron deficiency anemia secondary to blood loss (chronic): D50.0

## 2016-10-14 LAB — CBC WITH DIFFERENTIAL/PLATELET
Basophils Absolute: 0.1 10*3/uL (ref 0–0.1)
Basophils Relative: 2 %
EOS PCT: 3 %
Eosinophils Absolute: 0.1 10*3/uL (ref 0–0.7)
HCT: 27.6 % — ABNORMAL LOW (ref 40.0–52.0)
Hemoglobin: 8.9 g/dL — ABNORMAL LOW (ref 13.0–18.0)
LYMPHS ABS: 1.1 10*3/uL (ref 1.0–3.6)
LYMPHS PCT: 31 %
MCH: 21.8 pg — AB (ref 26.0–34.0)
MCHC: 32.2 g/dL (ref 32.0–36.0)
MCV: 67.7 fL — AB (ref 80.0–100.0)
MONO ABS: 0.3 10*3/uL (ref 0.2–1.0)
Monocytes Relative: 9 %
Neutro Abs: 2 10*3/uL (ref 1.4–6.5)
Neutrophils Relative %: 55 %
PLATELETS: 214 10*3/uL (ref 150–440)
RBC: 4.08 MIL/uL — ABNORMAL LOW (ref 4.40–5.90)
RDW: 16.6 % — AB (ref 11.5–14.5)
WBC: 3.6 10*3/uL — ABNORMAL LOW (ref 3.8–10.6)

## 2016-10-14 LAB — COMPREHENSIVE METABOLIC PANEL
ALT: 13 U/L — AB (ref 17–63)
AST: 21 U/L (ref 15–41)
Albumin: 3.9 g/dL (ref 3.5–5.0)
Alkaline Phosphatase: 68 U/L (ref 38–126)
Anion gap: 8 (ref 5–15)
BILIRUBIN TOTAL: 0.7 mg/dL (ref 0.3–1.2)
BUN: 11 mg/dL (ref 6–20)
CHLORIDE: 101 mmol/L (ref 101–111)
CO2: 24 mmol/L (ref 22–32)
Calcium: 8.7 mg/dL — ABNORMAL LOW (ref 8.9–10.3)
Creatinine, Ser: 0.78 mg/dL (ref 0.61–1.24)
Glucose, Bld: 94 mg/dL (ref 65–99)
Potassium: 4.4 mmol/L (ref 3.5–5.1)
Sodium: 133 mmol/L — ABNORMAL LOW (ref 135–145)
TOTAL PROTEIN: 8.1 g/dL (ref 6.5–8.1)

## 2016-10-14 LAB — IRON AND TIBC
IRON: 19 ug/dL — AB (ref 45–182)
Saturation Ratios: 5 % — ABNORMAL LOW (ref 17.9–39.5)
TIBC: 394 ug/dL (ref 250–450)
UIBC: 375 ug/dL

## 2016-10-14 LAB — FERRITIN: FERRITIN: 7 ng/mL — AB (ref 24–336)

## 2016-10-14 NOTE — Progress Notes (Signed)
Patient here today as a new patient  

## 2016-10-14 NOTE — Progress Notes (Signed)
Hematology/Oncology Consult note [facility] Telephone:([phone] [phone]  CONSULT NOTE Patient Care Team: Kirk Ruths, MD as PCP - General (Internal Medicine)  REFERRING PROVIDER: Kirk Ruths, MD  CHIEF COMPLAINTS/PURPOSE OF CONSULTATION:  Anemia  HISTORY OF PRESENTING ILLNESS:  Patient is a @ 71 y.o.  male with PMH listed below who was referred by [facility] to me for evaluation of anemia.  Extensive medical records review was performed and HemOnc related problems and patient's history are listed below.   1 Iron deficiency anemia: patient was found to have iron deficiency in April 2018 with ferritin of 5, Hb of 8-9, At that time he presented to hospital due to unstable angina, was found to have guaiac positive stools and melena. EGD/Colonoscopy were done and there was duodenitis and small duodenal polyps. He received one dose of Feraheme during this hospitalization.   2 Chronic GI blood loss: Duodenitis, he is on PPI. He was supposed to have EUS for work up of his duodenal polyps which appeared like a submucosal lesion. He has not followed with GI recently. Capsule study of small bowel also to be done.  3 coronary artery disease EF 45% and history of CABG (x 3 in 1999). Unstable angina this April due to anemia.   Patient reports feeling tired, lack of energy, persistent not improved with resting or taking naps. Denies seeing any blood in the stool.   ROS:  Review of Systems  Constitutional: Positive for fatigue.  HENT:  Negative.   Eyes: Negative.   Respiratory: Negative.   Cardiovascular: Negative.   Gastrointestinal: Negative.   Endocrine: Negative.   Genitourinary: Negative.    Musculoskeletal: Negative.   Skin: Negative.   Neurological: Negative.   Hematological: Negative.   Psychiatric/Behavioral: Negative.     MEDICAL HISTORY:  Past Medical History:  Diagnosis Date  . Angina   . Arthritis   . Blind right  eye   . CAD (coronary artery disease)   . ED (erectile dysfunction)   . GERD (gastroesophageal reflux disease)   . Glaucoma    RT eye  . Goiter    removed  . 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 Hyperlipidemia   . Hypertension   . Myocardial infarction ([place]) 1999  . Peripheral neuropathy   . Previous back surgery    discs placed in cervical spine  . Rotator cuff syndrome   . Shoulder pain, left   . Sleep apnea, obstructive    uses BIPAP at night    SURGICAL HISTORY: Past Surgical History:  Procedure Laterality Date  . COLONOSCOPY  05/05/2011   Procedure: COLONOSCOPY;  Surgeon: Lear Ng, MD;  Location: [facility] ENDOSCOPY;  Service: Endoscopy;  Laterality: N/A;  . COLONOSCOPY WITH PROPOFOL N/A 05/13/2016   Procedure: COLONOSCOPY WITH PROPOFOL;  Surgeon: [name], MD;  Location: [facility] ENDOSCOPY;  Service: Gastroenterology;  Laterality: N/A;  . CORONARY ARTERY BYPASS GRAFT  1999  . ESOPHAGOGASTRODUODENOSCOPY  05/05/2011   Procedure: ESOPHAGOGASTRODUODENOSCOPY (EGD);  Surgeon: Lear Ng, MD;  Location: [facility] ENDOSCOPY;  Service: Endoscopy;  Laterality: N/A;  . ESOPHAGOGASTRODUODENOSCOPY N/A 05/13/2016   Procedure: ESOPHAGOGASTRODUODENOSCOPY (EGD);  Surgeon: [name], MD;  Location: [facility] ENDOSCOPY;  Service: Gastroenterology;  Laterality: N/A;  . EYE SURGERY  08/09   Cataract Removal, bilateral  . HERNIA REPAIR     Inguinal Herniorhaphy  . LEFT HEART CATHETERIZATION WITH CORONARY ANGIOGRAM N/A 08/24/2013   Procedure: LEFT HEART CATHETERIZATION WITH CORONARY ANGIOGRAM;  Surgeon: Marcello Moores  Floyce Stakes, MD;  Location: [facility] CATH LAB;  Service: Cardiovascular;  Laterality: N/A;  . LEFT HEART CATHETERIZATION WITH CORONARY/GRAFT ANGIOGRAM Right 03/09/2011   Procedure: LEFT HEART CATHETERIZATION WITH Beatrix Fetters;  Surgeon: Lorretta Harp, MD;  Location: [facility] CATH LAB;  Service: Cardiovascular;  Laterality: Right;  . PARTIAL HIP ARTHROPLASTY Left   . SPINE SURGERY  10/09   C3-4,  C4-5 diskectomy    SOCIAL HISTORY: Social History   Social History  . Marital status: Married    Spouse name: N/A  . Number of children: N/A  . Years of education: N/A   Occupational History  . Not on file.   Social History Main Topics  . Smoking status: Never Smoker  . Smokeless tobacco: Never Used  . Alcohol use Yes     Comment: once a month, a few at a time  . Drug use: No     Comment: in past, 30 years ago  . Sexual activity: Yes   Other Topics Concern  . Not on file   Social History Narrative  . No narrative on file    FAMILY HISTORY: Family History  Problem Relation Age of Onset  . Heart attack Mother        Died at 34 with MI  . Heart attack Father        Died at unknown age with MI  . Heart disease Sister        CABG  . Leukemia Sister   . Heart attack Brother   . Lung cancer Brother   . Heart attack Sister   . Leukemia Sister   . Stroke Sister     ALLERGIES:  has No Known Allergies.  MEDICATIONS:  Current Outpatient Prescriptions  Medication Sig Dispense Refill  . acetaminophen (TYLENOL) 500 MG tablet Take 1,000 mg by mouth every 6 (six) hours as needed.    Marland Kitchen amLODipine (NORVASC) 10 MG tablet TAKE ONE TABLET BY MOUTH ONE TIME DAILY  30 tablet 0  . aspirin EC 81 MG tablet Take 81 mg by mouth daily.    Marland Kitchen atorvastatin (LIPITOR) 80 MG tablet Take 80 mg by mouth daily at 6 PM.     . carvedilol (COREG) 12.5 MG tablet Take 1 tablet (12.5 mg total) by mouth 2 (two) times daily with a meal. 60 tablet 6  . ferrous sulfate 325 (65 FE) MG tablet Take 325 mg by mouth daily with breakfast.    . isosorbide mononitrate (IMDUR) 30 MG 24 hr tablet Take 2 tablets (60 mg total) by mouth daily. 30 tablet 0  . lisinopril (PRINIVIL,ZESTRIL) 40 MG tablet Take one tablet by mouth one time daily 90 tablet 9  . nitroGLYCERIN (NITROSTAT) 0.4 MG SL tablet Place 1 tablet (0.4 mg total) under the tongue every 5 (five) minutes. Up to 3 times as needed for chest pain 25 tablet 3    . pantoprazole (PROTONIX) 40 MG tablet Take 1 tablet (40 mg total) by mouth daily. 30 tablet 0  . pravastatin (PRAVACHOL) 80 MG tablet Take 1 tablet (80 mg total) by mouth daily. 90 tablet 4  . timolol (TIMOPTIC) 0.5 % ophthalmic solution Administer 1 drop to the right eye Two (2) times a day.    . traMADol (ULTRAM) 50 MG tablet Take 1 to 2 tablets twice a day as needed for pain 60 tablet 1   No current facility-administered medications for this visit.       Marland Kitchen  PHYSICAL EXAMINATION: ECOG PERFORMANCE STATUS: 1 -  Symptomatic but completely ambulatory Vitals:   10/14/16 0949  BP: (!) 147/82  Pulse: 71  Temp: (!) 97 F (36.1 C)   Filed Weights   10/14/16 0949  Weight: 256 lb 3 oz (116.2 kg)    GENERAL:Alert, no distress and comfortable.  EYES: Pallor +, negative for icterus OROPHARYNX: no thrush or ulceration;  NECK: supple, no masses felt LYMPH:  no palpable lymphadenopathy in the cervical, axillary or inguinal regions LUNGS: clear to auscultation and  No wheeze or crackles HEART/CVS: regular rate & rhythm and no murmurs; No lower extremity edema ABDOMEN: abdomen soft, non-tender and normal bowel sounds Musculoskeletal:no cyanosis of digits and no clubbing  PSYCH: alert & oriented x 3  NEURO: no focal motor/sensory deficits SKIN:  no rashes or significant lesions  LABORATORY DATA:  I have reviewed the data as listed Lab Results  Component Value Date   WBC 3.6 (L) 10/14/2016   HGB 8.9 (L) 10/14/2016   HCT 27.6 (L) 10/14/2016   MCV 67.7 (L) 10/14/2016   PLT 214 10/14/2016    Recent Labs  05/11/16 0551 05/12/16 0450 05/13/16 0436 10/14/16 1105  NA 136 137 137 133*  K 3.8 3.9 3.7 4.4  CL 104 105 104 101  CO2 26 29 27 24   GLUCOSE 86 95 87 94  BUN 9 8 8 11   CREATININE 0.84 0.89 0.80 0.78  CALCIUM 8.3* 8.5* 8.6* 8.7*  GFRNONAA >60 >60 >60 >60  GFRAA >60 >60 >60 >60  PROT 6.9  --   --  8.1  ALBUMIN 3.4*  --   --  3.9  AST 16  --   --  21  ALT 12*  --   --   13*  ALKPHOS 65  --   --  68  BILITOT 0.5  --   --  0.7   Iron/TIBC/Ferritin/ %Sat    Component Value Date/Time   IRON 19 (L) 10/14/2016 1105   TIBC 394 10/14/2016 1105   FERRITIN 7 (L) 10/14/2016 1105   IRONPCTSAT 5 (L) 10/14/2016 1105    RADIOGRAPHIC STUDIES: I have personally reviewed the radiological images as listed and agreed with the findings in the report.   ASSESSMENT & PLAN:  1. Iron deficiency anemia due to chronic blood loss   2. Anemia due to GI blood loss    Will repeat CBC, CMP, iron TIBC, ferritin, Celiac Panel 10.  Patient tells me that he put off additional GI work up as he feels tired of have multiple testing. I spent time discussing with him the necessity of having comprehensive GI work up. Discussed with him that if iron store is not replete today, I plan to start him on parental iron infusion. Plan IV iron with Venofer 200mg  twice a week for 4 doses. Allergy reactions/infusion reaction including anaphylactic reaction discussed with patient. Patient voices understanding and willing to proceed. Also checking Celiac disease.  Will check UA when he comes back for follow up.   All questions were answered. The patient knows to call the clinic with any problems questions or concerns. Return of visit: 1 week.  otal face to face encounter time for this patient visit was 45 min. >50% of the time was  spent in counseling and coordination of care.  Thank you for this kind referral and the opportunity to participate in the care of this patient. A copy of today's note is routed to referring provider Dr.Anderson.     Earlie Server, MD, PhD Hematology Oncology [place] at  [facility] Pager- [phone] 10/14/2016

## 2016-10-15 LAB — HEMOGLOBINOPATHY EVALUATION
HGB A2 QUANT: 1.7 % — AB (ref 1.8–3.2)
HGB F QUANT: 0 % (ref 0.0–2.0)
HGB S QUANTITAION: 0 %
Hgb A: 98.3 % (ref 96.4–98.8)
Hgb C: 0 %
Hgb Variant: 0 %

## 2016-10-16 LAB — CELIAC PANEL 10
ANTIGLIADIN ABS, IGA: 5 U (ref 0–19)
ENDOMYSIAL ANTIBODY IGA: NEGATIVE
GLIADIN IGG: 3 U (ref 0–19)
IGA: 301 mg/dL (ref 61–437)
Tissue Transglutaminase Ab, IgA: <2 U/mL (ref 0–3)

## 2016-10-21 ENCOUNTER — Encounter: Payer: Self-pay | Admitting: Oncology

## 2016-10-21 ENCOUNTER — Inpatient Hospital Stay: Payer: Medicare Other

## 2016-10-21 ENCOUNTER — Inpatient Hospital Stay (HOSPITAL_BASED_OUTPATIENT_CLINIC_OR_DEPARTMENT_OTHER): Payer: Medicare Other | Admitting: Oncology

## 2016-10-21 VITALS — BP 101/57 | HR 67 | Temp 97.2°F | Wt 260.4 lb

## 2016-10-21 DIAGNOSIS — Z79899 Other long term (current) drug therapy: Secondary | ICD-10-CM

## 2016-10-21 DIAGNOSIS — I10 Essential (primary) hypertension: Secondary | ICD-10-CM

## 2016-10-21 DIAGNOSIS — I252 Old myocardial infarction: Secondary | ICD-10-CM

## 2016-10-21 DIAGNOSIS — Z86010 Personal history of colonic polyps: Secondary | ICD-10-CM

## 2016-10-21 DIAGNOSIS — I2510 Atherosclerotic heart disease of native coronary artery without angina pectoris: Secondary | ICD-10-CM | POA: Diagnosis not present

## 2016-10-21 DIAGNOSIS — N529 Male erectile dysfunction, unspecified: Secondary | ICD-10-CM | POA: Diagnosis not present

## 2016-10-21 DIAGNOSIS — E785 Hyperlipidemia, unspecified: Secondary | ICD-10-CM | POA: Diagnosis not present

## 2016-10-21 DIAGNOSIS — K2980 Duodenitis without bleeding: Secondary | ICD-10-CM | POA: Diagnosis not present

## 2016-10-21 DIAGNOSIS — D500 Iron deficiency anemia secondary to blood loss (chronic): Secondary | ICD-10-CM

## 2016-10-21 DIAGNOSIS — G4730 Sleep apnea, unspecified: Secondary | ICD-10-CM | POA: Diagnosis not present

## 2016-10-21 DIAGNOSIS — G629 Polyneuropathy, unspecified: Secondary | ICD-10-CM | POA: Diagnosis not present

## 2016-10-21 DIAGNOSIS — D509 Iron deficiency anemia, unspecified: Secondary | ICD-10-CM | POA: Diagnosis not present

## 2016-10-21 DIAGNOSIS — K219 Gastro-esophageal reflux disease without esophagitis: Secondary | ICD-10-CM | POA: Diagnosis not present

## 2016-10-21 DIAGNOSIS — I200 Unstable angina: Secondary | ICD-10-CM | POA: Diagnosis not present

## 2016-10-21 DIAGNOSIS — Z806 Family history of leukemia: Secondary | ICD-10-CM

## 2016-10-21 DIAGNOSIS — Z8619 Personal history of other infectious and parasitic diseases: Secondary | ICD-10-CM

## 2016-10-21 DIAGNOSIS — R5383 Other fatigue: Secondary | ICD-10-CM

## 2016-10-21 DIAGNOSIS — R52 Pain, unspecified: Secondary | ICD-10-CM

## 2016-10-21 DIAGNOSIS — D649 Anemia, unspecified: Secondary | ICD-10-CM

## 2016-10-21 MED ORDER — IRON SUCROSE 20 MG/ML IV SOLN
200.0000 mg | Freq: Once | INTRAVENOUS | Status: AC
Start: 2016-10-21 — End: 2016-10-21
  Administered 2016-10-21: 200 mg via INTRAVENOUS
  Filled 2016-10-21: qty 10

## 2016-10-21 MED ORDER — SODIUM CHLORIDE 0.9 % IV SOLN
Freq: Once | INTRAVENOUS | Status: AC
Start: 2016-10-21 — End: 2016-10-21
  Administered 2016-10-21: 12:00:00 via INTRAVENOUS
  Filled 2016-10-21: qty 1000

## 2016-10-21 NOTE — Progress Notes (Signed)
Patient here today for follow up.  Patient states no new concerns today  

## 2016-10-21 NOTE — Progress Notes (Signed)
Hematology/Oncology Follow Up [facility] [phone] [phone]   Patient Care Team: Kirk Ruths, MD as PCP - General (Internal Medicine)  REFERRING PROVIDER: Kirk Ruths, MD  CHIEF COMPLAINTS/REASON FOR VISIT  Follow up for treatment of anemia  HISTORY OF PRESENTING ILLNESS:  Patient is a  71 y.o.  William [name] with PMH listed below who presents for follow up of his evaluation and treatment of anemia.  Extensive medical records review was performed and HemOnc related problems and patient's history are listed below. Reviewed by me today.  1 Iron deficiency anemia: patient was found to have iron deficiency in April 2018 with ferritin of 5, Hb of 8-9, At that time he presented to hospital due to unstable angina, was found to have guaiac positive stools and melena. EGD/Colonoscopy were done and there was duodenitis and small duodenal polyps. He received one dose of Feraheme during this hospitalization.   2 Chronic GI blood loss: Duodenitis, he is on PPI. He was supposed to have EUS for work up of his duodenal polyps which appeared like a submucosal lesion.  Capsule study of small bowel is scheduled.  3 coronary artery disease EF 45% and history of CABG (x 3 in 1999). Unstable angina this April due to anemia. Currently stable and he denies any chest pain. \ 4 Fatigue: continue feeling tired, lack of energy, persistent not improved with resting or taking naps. Denies seeing any blood in the stool. Also associated with shortness of breath.   Review of Systems  Constitutional: Negative for fever, night sweats,unintentional weight loss, change in appetite. (+) fatigue HENT: Negative for ear pain, hearing loss, nasal bleeding Eyes: Negative for eye pain, double vision   Respiratory: Negative for wheezing, (+) shortness of breath Cardiovascular: Negative for chest pain, palpitation.   Gastrointestinal: Negative abdominal pain, diarrhea,  nausea vomiting Endocrine: Negative  Genitourinary: Negative for dysuria, hematuria, frequency Skin: Negative for rash, iching, bruising Neurological: Negative for headache, dizziness, seizure Hematological: Negative for easy bruising/bleeding, lymph node enlargement Psychiatric/Behavioral: Negative for depression, anxiety, suicidality  MEDICAL HISTORY:  Past Medical History:  Diagnosis Date  . Angina   . Arthritis   . Blind right eye   . CAD (coronary artery disease)   . ED (erectile dysfunction)   . GERD (gastroesophageal reflux disease)   . Glaucoma    RT eye  . Goiter    removed  . 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 Hyperlipidemia   . Hypertension   . Iron deficiency anemia due to chronic blood loss 10/14/2016  . Myocardial infarction ([place]) 1999  . Peripheral neuropathy   . Previous back surgery    discs placed in cervical spine  . Rotator cuff syndrome   . Shoulder pain, left   . Sleep apnea, obstructive    uses BIPAP at night    SURGICAL HISTORY: Past Surgical History:  Procedure Laterality Date  . COLONOSCOPY  05/05/2011   Procedure: COLONOSCOPY;  Surgeon: Lear Ng, MD;  Location: [facility] ENDOSCOPY;  Service: Endoscopy;  Laterality: N/A;  . COLONOSCOPY WITH PROPOFOL N/A 05/13/2016   Procedure: COLONOSCOPY WITH PROPOFOL;  Surgeon: [name], MD;  Location: [facility] ENDOSCOPY;  Service: Gastroenterology;  Laterality: N/A;  . CORONARY ARTERY BYPASS GRAFT  1999  . ESOPHAGOGASTRODUODENOSCOPY  05/05/2011   Procedure: ESOPHAGOGASTRODUODENOSCOPY (EGD);  Surgeon: Lear Ng, MD;  Location[facility] ENDOSCOPY;  Service: Endoscopy;  Laterality: N/A;  . ESOPHAGOGASTRODUODENOSCOPY N/A 05/13/2016   Procedure: ESOPHAGOGASTRODUODENOSCOPY (EGD);  Surgeon: [name], MD;  Location: [facility] ENDOSCOPY;  Service: Gastroenterology;  Laterality: N/A;  . EYE SURGERY  08/09   Cataract Removal, bilateral  . HERNIA REPAIR     Inguinal Herniorhaphy  . LEFT HEART CATHETERIZATION WITH  CORONARY ANGIOGRAM N/A 08/24/2013   Procedure: LEFT HEART CATHETERIZATION WITH CORONARY ANGIOGRAM;  Surgeon: Troy Sine, MD;  Location: [facility] CATH LAB;  Service: Cardiovascular;  Laterality: N/A;  . LEFT HEART CATHETERIZATION WITH CORONARY/GRAFT ANGIOGRAM Right 03/09/2011   Procedure: LEFT HEART CATHETERIZATION WITH Beatrix Fetters;  Surgeon: Lorretta Harp, MD;  Location: [facility] CATH LAB;  Service: Cardiovascular;  Laterality: Right;  . PARTIAL HIP ARTHROPLASTY Left   . SPINE SURGERY  10/09   C3-4, C4-5 diskectomy    SOCIAL HISTORY: Social History   Social History  . Marital status: Married    Spouse name: N/A  . Number of children: N/A  . Years of education: N/A   Occupational History  . Not on file.   Social History Main Topics  . Smoking status: Never Smoker  . Smokeless tobacco: Never Used  . Alcohol use Yes     Comment: once a month, a few at a time  . Drug use: No     Comment: in past, 30 years ago  . Sexual activity: Yes   Other Topics Concern  . Not on file   Social History Narrative  . No narrative on file    FAMILY HISTORY: Family History  Problem Relation Age of Onset  . Heart attack Mother        Died at 15 with MI  . Heart attack Father        Died at unknown age with MI  . Heart disease Sister        CABG  . Leukemia Sister   . Heart attack Brother   . Lung cancer Brother   . Heart attack Sister   . Leukemia Sister   . Stroke Sister     ALLERGIES:  has No Known Allergies.  MEDICATIONS:  Current Outpatient Prescriptions  Medication Sig Dispense Refill  . acetaminophen (TYLENOL) 500 MG tablet Take 1,000 mg by mouth every 6 (six) hours as needed.    Marland Kitchen amLODipine (NORVASC) 10 MG tablet TAKE ONE TABLET BY MOUTH ONE TIME DAILY  30 tablet 0  . aspirin EC 81 MG tablet Take 81 mg by mouth daily.    Marland Kitchen atorvastatin (LIPITOR) 80 MG tablet Take 80 mg by mouth daily at 6 PM.     . carvedilol (COREG) 12.5 MG tablet Take 1 tablet (12.5 mg total) by  mouth 2 (two) times daily with a meal. 60 tablet 6  . ferrous sulfate 325 (65 FE) MG tablet Take 325 mg by mouth daily with breakfast.    . isosorbide mononitrate (IMDUR) 30 MG 24 hr tablet Take 2 tablets (60 mg total) by mouth daily. 30 tablet 0  . lisinopril (PRINIVIL,ZESTRIL) 40 MG tablet Take one tablet by mouth one time daily 90 tablet 9  . nitroGLYCERIN (NITROSTAT) 0.4 MG SL tablet Place 1 tablet (0.4 mg total) under the tongue every 5 (five) minutes. Up to 3 times as needed for chest pain 25 tablet 3  . pantoprazole (PROTONIX) 40 MG tablet Take 1 tablet (40 mg total) by mouth daily. 30 tablet 0  . pravastatin (PRAVACHOL) 80 MG tablet Take 1 tablet (80 mg total) by mouth daily. 90 tablet 4  . timolol (TIMOPTIC) 0.5 % ophthalmic solution Administer 1 drop  to the right eye Two (2) times a day.    . traMADol (ULTRAM) 50 MG tablet Take 1 to 2 tablets twice a day as needed for pain 60 tablet 1   No current facility-administered medications for this visit.       Marland Kitchen  PHYSICAL EXAMINATION: ECOG PERFORMANCE STATUS: 1 - Symptomatic but completely ambulatory Vitals:   10/21/16 1100  BP: (!) 101/57  Pulse: 67  Temp: (!) 97.2 F (36.2 C)   Filed Weights   10/21/16 1100  Weight: 260 lb 6 oz (118.1 kg)   GENERAL: No distress, well nourished. Pallor SKIN:  No rashes or significant lesions  HEAD: Normocephalic, No masses, lesions, tenderness or abnormalities  EYES: non icteric ENT: External ears normal ,lips , buccal mucosa, and tongue normal and mucous membranes are moist  LYMPH: No palpable cervical and axillary lymphadenopathy  LUNGS: Clear to auscultation, no crackles or wheezes HEART: Regular rate & rhythm, no murmurs, no gallops, S1 normal and S2 normal  ABDOMEN: Abdomen soft, non-tender, normal bowel sounds, I did not appreciate any  masses or organomegaly  MUSCULOSKELETAL: No CVA tenderness and no tenderness on percussion of the back or rib cage.  EXTREMITIES: No edema, no skin  discoloration or tenderness NEURO: Alert & oriented, no focal motor/sensory deficits.  LABORATORY DATA:  I have reviewed the data as listed Lab Results  Component Value Date   WBC 3.6 (L) 10/14/2016   HGB 8.9 (L) 10/14/2016   HCT 27.6 (L) 10/14/2016   MCV 67.7 (L) 10/14/2016   PLT 214 10/14/2016    Recent Labs  05/11/16 0551 05/12/16 0450 05/13/16 0436 10/14/16 1105  NA 136 137 137 133*  K 3.8 3.9 3.7 4.4  CL 104 105 104 101  CO2 26 29 27 24   GLUCOSE 86 95 87 94  BUN 9 8 8 11   CREATININE 0.84 0.89 0.80 0.78  CALCIUM 8.3* 8.5* 8.6* 8.7*  GFRNONAA >60 >60 >60 >60  GFRAA >60 >60 >60 >60  PROT 6.9  --   --  8.1  ALBUMIN 3.4*  --   --  3.9  AST 16  --   --  21  ALT 12*  --   --  13*  ALKPHOS 65  --   --  68  BILITOT 0.5  --   --  0.7   Iron/TIBC/Ferritin/ %Sat    Component Value Date/Time   IRON 19 (L) 10/14/2016 1105   TIBC 394 10/14/2016 1105   FERRITIN 7 (L) 10/14/2016 1105   IRONPCTSAT 5 (L) 10/14/2016 1105    RADIOGRAPHIC STUDIES: I have personally reviewed the radiological images as listed and agreed with the findings in the report. No recent studies.   ASSESSMENT & PLAN:  1. Iron deficiency anemia due to chronic blood loss   2. Anemia due to GI blood loss   3. Fatigue associated with anemia    CBC, CMP, iron TIBC, ferritin, Celiac Panel 10 results reviewed with patient. He has severe iron deficiency Plan IV iron with Venofer 200mg  twice a week for 4 doses. First dose today Allergy reactions/infusion reaction including anaphylactic reaction discussed with patient. Patient voices understanding and willing to proceed. Will check UA when he comes back for follow up.  He has capsule study scheduled.   All questions were answered. The patient knows to call the clinic with any problems questions or concerns. Return of visit: 11/6 with labs done 1 day prior Thank you for this kind referral and the opportunity  to participate in the care of this patient. A copy  of today's note is routed to referring provider Dr.Anderson Earlie Server, MD, PhD Hematology Oncology [facility] Pager- [phone] 10/21/2016

## 2016-10-23 ENCOUNTER — Telehealth: Payer: Self-pay | Admitting: *Deleted

## 2016-10-23 ENCOUNTER — Other Ambulatory Visit: Payer: Self-pay | Admitting: Oncology

## 2016-10-23 ENCOUNTER — Inpatient Hospital Stay: Payer: Medicare Other

## 2016-10-23 DIAGNOSIS — D649 Anemia, unspecified: Secondary | ICD-10-CM

## 2016-10-23 DIAGNOSIS — D500 Iron deficiency anemia secondary to blood loss (chronic): Secondary | ICD-10-CM

## 2016-10-23 DIAGNOSIS — D509 Iron deficiency anemia, unspecified: Secondary | ICD-10-CM | POA: Diagnosis not present

## 2016-10-23 LAB — CBC WITH DIFFERENTIAL/PLATELET
BASOS PCT: 1 %
Basophils Absolute: 0.1 10*3/uL (ref 0–0.1)
EOS ABS: 0.2 10*3/uL (ref 0–0.7)
Eosinophils Relative: 4 %
HCT: 26.1 % — ABNORMAL LOW (ref 40.0–52.0)
HEMOGLOBIN: 8.1 g/dL — AB (ref 13.0–18.0)
Lymphocytes Relative: 25 %
Lymphs Abs: 1.2 10*3/uL (ref 1.0–3.6)
MCH: 21.7 pg — ABNORMAL LOW (ref 26.0–34.0)
MCHC: 31.1 g/dL — AB (ref 32.0–36.0)
MCV: 69.7 fL — ABNORMAL LOW (ref 80.0–100.0)
MONOS PCT: 10 %
Monocytes Absolute: 0.5 10*3/uL (ref 0.2–1.0)
NEUTROS PCT: 60 %
Neutro Abs: 2.9 10*3/uL (ref 1.4–6.5)
Platelets: 215 10*3/uL (ref 150–440)
RBC: 3.75 MIL/uL — AB (ref 4.40–5.90)
RDW: 18.1 % — ABNORMAL HIGH (ref 11.5–14.5)
WBC: 4.8 10*3/uL (ref 3.8–10.6)

## 2016-10-23 LAB — SAMPLE TO BLOOD BANK

## 2016-10-23 MED ORDER — HEPARIN SOD (PORK) LOCK FLUSH 100 UNIT/ML IV SOLN: 500.0000 [IU] | Freq: Every day | INTRAVENOUS | Status: DC | PRN

## 2016-10-23 MED ORDER — SODIUM CHLORIDE 0.9% FLUSH
10.0000 mL | INTRAVENOUS | Status: DC | PRN
  Filled 2016-10-23: qty 10

## 2016-10-23 MED ORDER — HEPARIN SOD (PORK) LOCK FLUSH 100 UNIT/ML IV SOLN: 250.0000 [IU] | INTRAVENOUS | Status: DC | PRN

## 2016-10-23 MED ORDER — SODIUM CHLORIDE 0.9% FLUSH
3.0000 mL | INTRAVENOUS | Status: DC | PRN
  Filled 2016-10-23: qty 3

## 2016-10-23 MED ORDER — SODIUM CHLORIDE 0.9 % IV SOLN
250.0000 mL | Freq: Once | INTRAVENOUS | Status: DC
  Filled 2016-10-23: qty 250

## 2016-10-23 NOTE — Telephone Encounter (Signed)
I ordered transfusion order. thanks

## 2016-10-23 NOTE — Telephone Encounter (Signed)
Patient seen by Dr Ouida Sills this morning and very short of breath just walking in office and also associated chest pain. He is asking if we could possibly give him blood tomorrow. I spoke with Dr Tasia Catchings and she is going to call Dr Ouida Sills to discuss.  I also checked with prior authorization department who said that Venofer and blood can be given same day.  Dr Tasia Catchings ordered CBC, TXM, and transfuse 1 unit PRBC tomorrow if schedule allows.  Patient added to schedule for transfusion tomorrow, will have patient come in for labs today. He agrees to come in now for labs.  Dr Tasia Catchings, I have put in for cbc and Hold Tube, but I need you to order the blood transfusion which includes the type and screen and crossmatch. Thank you

## 2016-10-24 ENCOUNTER — Telehealth: Payer: Self-pay

## 2016-10-24 ENCOUNTER — Inpatient Hospital Stay: Payer: Medicare Other

## 2016-10-24 ENCOUNTER — Other Ambulatory Visit: Payer: Self-pay | Admitting: Oncology

## 2016-10-24 VITALS — BP 121/68 | HR 67 | Temp 96.4°F | Resp 20

## 2016-10-24 DIAGNOSIS — D649 Anemia, unspecified: Secondary | ICD-10-CM

## 2016-10-24 DIAGNOSIS — D500 Iron deficiency anemia secondary to blood loss (chronic): Secondary | ICD-10-CM

## 2016-10-24 DIAGNOSIS — D509 Iron deficiency anemia, unspecified: Secondary | ICD-10-CM | POA: Diagnosis not present

## 2016-10-24 LAB — PREPARE RBC (CROSSMATCH)

## 2016-10-24 MED ORDER — DIPHENHYDRAMINE HCL 50 MG/ML IJ SOLN
25.0000 mg | Freq: Once | INTRAMUSCULAR | Status: AC
  Administered 2016-10-24: 25 mg via INTRAVENOUS
  Filled 2016-10-24: qty 1

## 2016-10-24 MED ORDER — HEPARIN SOD (PORK) LOCK FLUSH 100 UNIT/ML IV SOLN: 500.0000 [IU] | Freq: Every day | INTRAVENOUS | Status: DC | PRN

## 2016-10-24 MED ORDER — SODIUM CHLORIDE 0.9 % IV SOLN
Freq: Once | INTRAVENOUS | Status: AC
  Administered 2016-10-24: 11:00:00 via INTRAVENOUS
  Filled 2016-10-24: qty 1000

## 2016-10-24 MED ORDER — SODIUM CHLORIDE 0.9 % IV SOLN
250.0000 mL | Freq: Once | INTRAVENOUS | Status: AC
  Administered 2016-10-24: 250 mL via INTRAVENOUS
  Filled 2016-10-24: qty 250

## 2016-10-24 MED ORDER — SODIUM CHLORIDE 0.9% FLUSH
3.0000 mL | INTRAVENOUS | Status: DC | PRN
  Filled 2016-10-24: qty 3

## 2016-10-24 MED ORDER — IRON SUCROSE 20 MG/ML IV SOLN
200.0000 mg | Freq: Once | INTRAVENOUS | Status: AC
  Administered 2016-10-24: 200 mg via INTRAVENOUS
  Filled 2016-10-24: qty 10

## 2016-10-24 MED ORDER — SODIUM CHLORIDE 0.9 % IV SOLN
250.0000 mL | Freq: Once | INTRAVENOUS | Status: DC
  Filled 2016-10-24: qty 250

## 2016-10-24 MED ORDER — HEPARIN SOD (PORK) LOCK FLUSH 100 UNIT/ML IV SOLN: 250.0000 [IU] | INTRAVENOUS | Status: DC | PRN

## 2016-10-24 MED ORDER — ACETAMINOPHEN 325 MG PO TABS
650.0000 mg | ORAL_TABLET | Freq: Once | ORAL | Status: AC
  Administered 2016-10-24: 650 mg via ORAL
  Filled 2016-10-24: qty 2

## 2016-10-24 MED ORDER — SODIUM CHLORIDE 0.9% FLUSH
10.0000 mL | INTRAVENOUS | Status: DC | PRN
  Filled 2016-10-24: qty 10

## 2016-10-24 NOTE — Telephone Encounter (Signed)
Entered in error

## 2016-10-25 LAB — TYPE AND SCREEN
ABO/RH(D): O POS
ANTIBODY SCREEN: NEGATIVE
UNIT DIVISION: 0

## 2016-10-25 LAB — BPAM RBC
BLOOD PRODUCT EXPIRATION DATE: 201810232359
ISSUE DATE / TIME: 201810121222
UNIT TYPE AND RH: 5100

## 2016-10-28 ENCOUNTER — Inpatient Hospital Stay: Payer: Medicare Other

## 2016-10-28 VITALS — BP 129/76 | HR 69 | Resp 20

## 2016-10-28 DIAGNOSIS — D509 Iron deficiency anemia, unspecified: Secondary | ICD-10-CM | POA: Diagnosis not present

## 2016-10-28 DIAGNOSIS — D500 Iron deficiency anemia secondary to blood loss (chronic): Secondary | ICD-10-CM

## 2016-10-28 MED ORDER — IRON SUCROSE 20 MG/ML IV SOLN
200.0000 mg | Freq: Once | INTRAVENOUS | Status: AC
  Administered 2016-10-28: 200 mg via INTRAVENOUS
  Filled 2016-10-28: qty 10

## 2016-10-28 MED ORDER — SODIUM CHLORIDE 0.9 % IV SOLN
Freq: Once | INTRAVENOUS | Status: AC
  Administered 2016-10-28: 12:00:00 via INTRAVENOUS
  Filled 2016-10-28: qty 1000

## 2016-10-31 ENCOUNTER — Inpatient Hospital Stay: Payer: Medicare Other

## 2016-10-31 VITALS — BP 136/84 | HR 72 | Temp 96.5°F

## 2016-10-31 DIAGNOSIS — D500 Iron deficiency anemia secondary to blood loss (chronic): Secondary | ICD-10-CM

## 2016-10-31 DIAGNOSIS — D509 Iron deficiency anemia, unspecified: Secondary | ICD-10-CM | POA: Diagnosis not present

## 2016-10-31 MED ORDER — IRON SUCROSE 20 MG/ML IV SOLN
200.0000 mg | Freq: Once | INTRAVENOUS | Status: AC
  Administered 2016-10-31: 200 mg via INTRAVENOUS
  Filled 2016-10-31: qty 10

## 2016-10-31 MED ORDER — SODIUM CHLORIDE 0.9 % IV SOLN
Freq: Once | INTRAVENOUS | Status: AC
  Administered 2016-10-31: 14:00:00 via INTRAVENOUS
  Filled 2016-10-31: qty 1000

## 2016-11-12 ENCOUNTER — Ambulatory Visit (INDEPENDENT_AMBULATORY_CARE_PROVIDER_SITE_OTHER): Payer: Medicare Other | Admitting: Gastroenterology

## 2016-11-12 ENCOUNTER — Encounter: Payer: Self-pay | Admitting: Gastroenterology

## 2016-11-12 ENCOUNTER — Other Ambulatory Visit: Payer: Self-pay

## 2016-11-12 VITALS — BP 145/75 | HR 72 | Temp 97.9°F | Ht 70.0 in | Wt 265.2 lb

## 2016-11-12 DIAGNOSIS — D509 Iron deficiency anemia, unspecified: Secondary | ICD-10-CM

## 2016-11-12 DIAGNOSIS — H5440 Blindness, one eye, unspecified eye: Secondary | ICD-10-CM | POA: Insufficient documentation

## 2016-11-12 DIAGNOSIS — K5900 Constipation, unspecified: Secondary | ICD-10-CM

## 2016-11-12 MED ORDER — POLYETHYLENE GLYCOL 3350 17 GM/SCOOP PO POWD: 1.0000 | Freq: Every day | ORAL | 3 refills | Status: DC

## 2016-11-12 MED ORDER — POLYETHYLENE GLYCOL 3350 17 GM/SCOOP PO POWD: 1.0000 | Freq: Every day | ORAL | 3 refills | Status: AC

## 2016-11-12 NOTE — Progress Notes (Signed)
[name] MD, MRCP(U.K) [address]  [place]  [place], [place] [address]  Main: [phone]  Fax: [phone]   Primary Care Physician: Kirk Ruths, MD  Primary Gastroenterologist:  Dr. [name]   No chief complaint on file.   HPI: William [name]. is a 71 y.o. male     Summary of history:   He was  admitted on 05/10/16 with anemia at [facility] . He also had chest pain and shortness of breath and found to have a low Hb of 9.1 grams , ferritin was 5 . He had EGD and colonoscopy by Dr Michail Sermon in 2013 for severe iron deficiency anemia when he was admitted at [facility]. EGD was negative, colonoscopy revealed polyp only. I performed an EGD and colonoscopy 05/2016  which shouwed no bleeding,I found a small duodenal polyp which appeared like a submucosal lesion, duodenitis and thickened fold at the antrum.  I found 4 adenomatous appearing polyps in the colon which were resected.    Interval history   5//2018-  11/12/2016    Labs 05/2016 - Celiac serology -normal. No b12 or folate or urine analysis  Results Receiving IV iron. He has been sent back to pursue incomplete GI evaluation . Still has a microcytic iron deficiency anemia.   Says his stool is very hard. Lots of soreness. Denies any overt blood loss.    Current Outpatient Prescriptions  Medication Sig Dispense Refill  . acetaminophen (TYLENOL) 500 MG tablet Take 1,000 mg by mouth every 6 (six) hours as needed.    Marland Kitchen amLODipine (NORVASC) 10 MG tablet TAKE ONE TABLET BY MOUTH ONE TIME DAILY  30 tablet 0  . aspirin EC 81 MG tablet Take 81 mg by mouth daily.    Marland Kitchen atorvastatin (LIPITOR) 80 MG tablet Take 80 mg by mouth daily at 6 PM.     . carvedilol (COREG) 12.5 MG tablet Take 1 tablet (12.5 mg total) by mouth 2 (two) times daily with a meal. 60 tablet 6  . ferrous sulfate 325 (65 FE) MG tablet Take 325 mg by mouth daily with breakfast.    . ferrous sulfate 325 (65 FE) MG tablet Take by mouth.    .  isosorbide mononitrate (IMDUR) 30 MG 24 hr tablet Take 2 tablets (60 mg total) by mouth daily. 30 tablet 0  . lisinopril (PRINIVIL,ZESTRIL) 40 MG tablet Take one tablet by mouth one time daily 90 tablet 9  . nitroGLYCERIN (NITROSTAT) 0.4 MG SL tablet Place 1 tablet (0.4 mg total) under the tongue every 5 (five) minutes. Up to 3 times as needed for chest pain 25 tablet 3  . pantoprazole (PROTONIX) 40 MG tablet Take 1 tablet (40 mg total) by mouth daily. 30 tablet 0  . pravastatin (PRAVACHOL) 80 MG tablet Take 1 tablet (80 mg total) by mouth daily. 90 tablet 4  . timolol (TIMOPTIC) 0.5 % ophthalmic solution Administer 1 drop to the right eye Two (2) times a day.    . traMADol (ULTRAM) 50 MG tablet Take 1 to 2 tablets twice a day as needed for pain 60 tablet 1   No current facility-administered medications for this visit.     Allergies as of 11/12/2016  . (No Known Allergies)    ROS:  General: Negative for anorexia, weight loss, fever, chills, fatigue, weakness. ENT: Negative for hoarseness, difficulty swallowing , nasal congestion. CV: Negative for chest pain, angina, palpitations, dyspnea on exertion, peripheral edema.  Respiratory: Negative for dyspnea  at rest, dyspnea on exertion, cough, sputum, wheezing.  GI: See history of present illness. GU:  Negative for dysuria, hematuria, urinary incontinence, urinary frequency, nocturnal urination.  Endo: Negative for unusual weight change.    Physical Examination:   There were no vitals taken for this visit.  General: Well-nourished, well-developed in no acute distress.  Eyes: No icterus. Conjunctivae pink. Mouth: Oropharyngeal mucosa moist and pink , no lesions erythema or exudate. Lungs: Clear to auscultation bilaterally. Non-labored. Heart: Regular rate and rhythm, no murmurs rubs or gallops.  Abdomen: Bowel sounds are normal, nontender, nondistended, no hepatosplenomegaly or masses, no abdominal bruits or hernia , no rebound or  guarding.   Extremities: No lower extremity edema. No clubbing or deformities. Neuro: Alert and oriented x 3.  Grossly intact. Skin: Warm and dry, no jaundice.   Psych: Alert and cooperative, normal mood and affect.   Imaging Studies: No results found.  Assessment and Plan:   William [name]. is a 71 y.o. y/o male here  to follow up for iron deficiency anemia. Did not follow up last 5 months. Has not undergone small bowel capsule study as suggested after last visit as non lesions which would cause anemia seen on EGD+colonoscopy . He also has a duodenal submucosal lesion that needs further evaluation  .   Plan  1. Capsule study of the small bowel (previously suggested but not obtained  2. Check urine analysis ,b12,folate, CBC next wek  3. EUS to evaluate duodneal polyp after capsule study  4. Miralax for constipation , high fiber diet- patient information provided   Risks, benefits, alternatives of Givens capsule discussed with patient to include but not limited to the rare risk of Given's capsule becoming lodged in the GI tract requiring surgical removal.  The patient agrees with this plan & consent will be obtained.  Dr [name]  MD,MRCP [facility]) Follow up in 8-12 weeks

## 2016-11-12 NOTE — H&P (View-Only) (Signed)
[name] MD, MRCP(U.K) [address]  [place]  [place], [place] [address]  Main: [phone]  Fax: [phone]   Primary Care Physician: Kirk Ruths, MD  Primary Gastroenterologist:  Dr. [name]   No chief complaint on file.   HPI: William Adell. is a 71 y.o. male     Summary of history:   He was  admitted on 05/10/16 with anemia at [facility] . He also had chest pain and shortness of breath and found to have a low Hb of 9.1 grams , ferritin was 5 . He had EGD and colonoscopy by Dr Michail Sermon in 2013 for severe iron deficiency anemia when he was admitted at [facility]. EGD was negative, colonoscopy revealed polyp only. I performed an EGD and colonoscopy 05/2016  which shouwed no bleeding,I found a small duodenal polyp which appeared like a submucosal lesion, duodenitis and thickened fold at the antrum.  I found 4 adenomatous appearing polyps in the colon which were resected.    Interval history   5//2018-  11/12/2016    Labs 05/2016 - Celiac serology -normal. No b12 or folate or urine analysis  Results Receiving IV iron. He has been sent back to pursue incomplete GI evaluation . Still has a microcytic iron deficiency anemia.   Says his stool is very hard. Lots of soreness. Denies any overt blood loss.    Current Outpatient Prescriptions  Medication Sig Dispense Refill  . acetaminophen (TYLENOL) 500 MG tablet Take 1,000 mg by mouth every 6 (six) hours as needed.    Marland Kitchen amLODipine (NORVASC) 10 MG tablet TAKE ONE TABLET BY MOUTH ONE TIME DAILY  30 tablet 0  . aspirin EC 81 MG tablet Take 81 mg by mouth daily.    Marland Kitchen atorvastatin (LIPITOR) 80 MG tablet Take 80 mg by mouth daily at 6 PM.     . carvedilol (COREG) 12.5 MG tablet Take 1 tablet (12.5 mg total) by mouth 2 (two) times daily with a meal. 60 tablet 6  . ferrous sulfate 325 (65 FE) MG tablet Take 325 mg by mouth daily with breakfast.    . ferrous sulfate 325 (65 FE) MG tablet Take by mouth.    .  isosorbide mononitrate (IMDUR) 30 MG 24 hr tablet Take 2 tablets (60 mg total) by mouth daily. 30 tablet 0  . lisinopril (PRINIVIL,ZESTRIL) 40 MG tablet Take one tablet by mouth one time daily 90 tablet 9  . nitroGLYCERIN (NITROSTAT) 0.4 MG SL tablet Place 1 tablet (0.4 mg total) under the tongue every 5 (five) minutes. Up to 3 times as needed for chest pain 25 tablet 3  . pantoprazole (PROTONIX) 40 MG tablet Take 1 tablet (40 mg total) by mouth daily. 30 tablet 0  . pravastatin (PRAVACHOL) 80 MG tablet Take 1 tablet (80 mg total) by mouth daily. 90 tablet 4  . timolol (TIMOPTIC) 0.5 % ophthalmic solution Administer 1 drop to the right eye Two (2) times a day.    . traMADol (ULTRAM) 50 MG tablet Take 1 to 2 tablets twice a day as needed for pain 60 tablet 1   No current facility-administered medications for this visit.     Allergies as of 11/12/2016  . (No Known Allergies)    ROS:  General: Negative for anorexia, weight loss, fever, chills, fatigue, weakness. ENT: Negative for hoarseness, difficulty swallowing , nasal congestion. CV: Negative for chest pain, angina, palpitations, dyspnea on exertion, peripheral edema.  Respiratory: Negative for dyspnea  at rest, dyspnea on exertion, cough, sputum, wheezing.  GI: See history of present illness. GU:  Negative for dysuria, hematuria, urinary incontinence, urinary frequency, nocturnal urination.  Endo: Negative for unusual weight change.    Physical Examination:   There were no vitals taken for this visit.  General: Well-nourished, well-developed in no acute distress.  Eyes: No icterus. Conjunctivae pink. Mouth: Oropharyngeal mucosa moist and pink , no lesions erythema or exudate. Lungs: Clear to auscultation bilaterally. Non-labored. Heart: Regular rate and rhythm, no murmurs rubs or gallops.  Abdomen: Bowel sounds are normal, nontender, nondistended, no hepatosplenomegaly or masses, no abdominal bruits or hernia , no rebound or  guarding.   Extremities: No lower extremity edema. No clubbing or deformities. Neuro: Alert and oriented x 3.  Grossly intact. Skin: Warm and dry, no jaundice.   Psych: Alert and cooperative, normal mood and affect.   Imaging Studies: No results found.  Assessment and Plan:   William Baltzell. is a 71 y.o. y/o male here  to follow up for iron deficiency anemia. Did not follow up last 5 months. Has not undergone small bowel capsule study as suggested after last visit as non lesions which would cause anemia seen on EGD+colonoscopy . He also has a duodenal submucosal lesion that needs further evaluation  .   Plan  1. Capsule study of the small bowel (previously suggested but not obtained  2. Check urine analysis ,b12,folate, CBC next wek  3. EUS to evaluate duodneal polyp after capsule study  4. Miralax for constipation , high fiber diet- patient information provided   Risks, benefits, alternatives of Givens capsule discussed with patient to include but not limited to the rare risk of Given's capsule becoming lodged in the GI tract requiring surgical removal.  The patient agrees with this plan & consent will be obtained.  Dr [name]  MD,MRCP [facility]) Follow up in 8-12 weeks

## 2016-11-17 ENCOUNTER — Ambulatory Visit
Admission: RE | Admit: 2016-11-17 | Discharge: 2016-11-17 | Disposition: A | Discharge status: Home / Self Care | Payer: Medicare Other | Source: Ambulatory Visit | Attending: Gastroenterology | Admitting: Gastroenterology

## 2016-11-17 ENCOUNTER — Encounter: Payer: Self-pay | Admitting: Anesthesiology

## 2016-11-17 ENCOUNTER — Inpatient Hospital Stay: Payer: Medicare Other | Attending: Oncology | Admitting: *Deleted

## 2016-11-17 ENCOUNTER — Encounter
Admission: RE | Disposition: A | Discharge status: Home / Self Care | Payer: Self-pay | Source: Ambulatory Visit | Attending: Gastroenterology

## 2016-11-17 ENCOUNTER — Encounter: Payer: Self-pay | Admitting: Gastroenterology

## 2016-11-17 DIAGNOSIS — D509 Iron deficiency anemia, unspecified: Secondary | ICD-10-CM | POA: Diagnosis not present

## 2016-11-17 DIAGNOSIS — E785 Hyperlipidemia, unspecified: Secondary | ICD-10-CM | POA: Diagnosis not present

## 2016-11-17 DIAGNOSIS — I252 Old myocardial infarction: Secondary | ICD-10-CM | POA: Diagnosis not present

## 2016-11-17 DIAGNOSIS — D500 Iron deficiency anemia secondary to blood loss (chronic): Secondary | ICD-10-CM | POA: Diagnosis not present

## 2016-11-17 DIAGNOSIS — I2510 Atherosclerotic heart disease of native coronary artery without angina pectoris: Secondary | ICD-10-CM | POA: Diagnosis not present

## 2016-11-17 DIAGNOSIS — K922 Gastrointestinal hemorrhage, unspecified: Secondary | ICD-10-CM | POA: Diagnosis not present

## 2016-11-17 DIAGNOSIS — I10 Essential (primary) hypertension: Secondary | ICD-10-CM | POA: Insufficient documentation

## 2016-11-17 DIAGNOSIS — Z806 Family history of leukemia: Secondary | ICD-10-CM | POA: Insufficient documentation

## 2016-11-17 DIAGNOSIS — Z803 Family history of malignant neoplasm of breast: Secondary | ICD-10-CM | POA: Insufficient documentation

## 2016-11-17 DIAGNOSIS — R5383 Other fatigue: Secondary | ICD-10-CM | POA: Insufficient documentation

## 2016-11-17 DIAGNOSIS — K2980 Duodenitis without bleeding: Secondary | ICD-10-CM | POA: Diagnosis not present

## 2016-11-17 DIAGNOSIS — N529 Male erectile dysfunction, unspecified: Secondary | ICD-10-CM | POA: Insufficient documentation

## 2016-11-17 DIAGNOSIS — Z7982 Long term (current) use of aspirin: Secondary | ICD-10-CM | POA: Insufficient documentation

## 2016-11-17 DIAGNOSIS — G629 Polyneuropathy, unspecified: Secondary | ICD-10-CM | POA: Diagnosis not present

## 2016-11-17 DIAGNOSIS — K219 Gastro-esophageal reflux disease without esophagitis: Secondary | ICD-10-CM | POA: Insufficient documentation

## 2016-11-17 DIAGNOSIS — G4733 Obstructive sleep apnea (adult) (pediatric): Secondary | ICD-10-CM | POA: Insufficient documentation

## 2016-11-17 DIAGNOSIS — Z79899 Other long term (current) drug therapy: Secondary | ICD-10-CM | POA: Diagnosis not present

## 2016-11-17 DIAGNOSIS — M129 Arthropathy, unspecified: Secondary | ICD-10-CM | POA: Insufficient documentation

## 2016-11-17 DIAGNOSIS — D649 Anemia, unspecified: Secondary | ICD-10-CM | POA: Diagnosis present

## 2016-11-17 HISTORY — PX: GIVENS CAPSULE STUDY: SHX5432

## 2016-11-17 LAB — URINALYSIS, COMPLETE (UACMP) WITH MICROSCOPIC
BILIRUBIN URINE: NEGATIVE
Glucose, UA: NEGATIVE mg/dL
HGB URINE DIPSTICK: NEGATIVE
KETONES UR: NEGATIVE mg/dL
LEUKOCYTES UA: NEGATIVE
Nitrite: NEGATIVE
PH: 7 (ref 5.0–8.0)
Protein, ur: NEGATIVE mg/dL
SPECIFIC GRAVITY, URINE: 1.02 (ref 1.005–1.030)

## 2016-11-17 LAB — CBC WITH DIFFERENTIAL/PLATELET
Basophils Absolute: 0.1 10*3/uL (ref 0–0.1)
Basophils Relative: 1 %
Eosinophils Absolute: 0.2 10*3/uL (ref 0–0.7)
Eosinophils Relative: 4 %
HEMATOCRIT: 36.7 % — AB (ref 40.0–52.0)
HEMOGLOBIN: 11.7 g/dL — AB (ref 13.0–18.0)
LYMPHS ABS: 1.3 10*3/uL (ref 1.0–3.6)
LYMPHS PCT: 30 %
MCH: 24 pg — ABNORMAL LOW (ref 26.0–34.0)
MCHC: 32 g/dL (ref 32.0–36.0)
MCV: 74.9 fL — AB (ref 80.0–100.0)
MONO ABS: 0.3 10*3/uL (ref 0.2–1.0)
MONOS PCT: 8 %
NEUTROS ABS: 2.4 10*3/uL (ref 1.4–6.5)
NEUTROS PCT: 57 %
Platelets: 203 10*3/uL (ref 150–440)
RBC: 4.89 MIL/uL (ref 4.40–5.90)
RDW: 24 % — AB (ref 11.5–14.5)
WBC: 4.2 10*3/uL (ref 3.8–10.6)

## 2016-11-17 LAB — FERRITIN: Ferritin: 48 ng/mL (ref 24–336)

## 2016-11-17 LAB — VITAMIN B12: Vitamin B-12: 469 pg/mL (ref 180–914)

## 2016-11-17 LAB — IRON AND TIBC
Iron: 39 ug/dL — ABNORMAL LOW (ref 45–182)
SATURATION RATIOS: 11 % — AB (ref 17.9–39.5)
TIBC: 342 ug/dL (ref 250–450)
UIBC: 303 ug/dL

## 2016-11-17 LAB — FOLATE: Folate: 24 ng/mL (ref >5.9)

## 2016-11-17 SURGERY — IMAGING PROCEDURE, GI TRACT, INTRALUMINAL, VIA CAPSULE

## 2016-11-18 ENCOUNTER — Inpatient Hospital Stay (HOSPITAL_BASED_OUTPATIENT_CLINIC_OR_DEPARTMENT_OTHER): Payer: Medicare Other | Admitting: Oncology

## 2016-11-18 ENCOUNTER — Inpatient Hospital Stay: Payer: Medicare Other

## 2016-11-18 ENCOUNTER — Encounter: Payer: Self-pay | Admitting: Oncology

## 2016-11-18 VITALS — BP 107/69 | HR 72 | Resp 18

## 2016-11-18 VITALS — BP 130/66 | HR 71 | Temp 97.0°F | Resp 18 | Wt 267.4 lb

## 2016-11-18 DIAGNOSIS — Z79899 Other long term (current) drug therapy: Secondary | ICD-10-CM

## 2016-11-18 DIAGNOSIS — M129 Arthropathy, unspecified: Secondary | ICD-10-CM

## 2016-11-18 DIAGNOSIS — I2510 Atherosclerotic heart disease of native coronary artery without angina pectoris: Secondary | ICD-10-CM

## 2016-11-18 DIAGNOSIS — I252 Old myocardial infarction: Secondary | ICD-10-CM | POA: Diagnosis not present

## 2016-11-18 DIAGNOSIS — K922 Gastrointestinal hemorrhage, unspecified: Secondary | ICD-10-CM

## 2016-11-18 DIAGNOSIS — I10 Essential (primary) hypertension: Secondary | ICD-10-CM | POA: Diagnosis not present

## 2016-11-18 DIAGNOSIS — K2980 Duodenitis without bleeding: Secondary | ICD-10-CM

## 2016-11-18 DIAGNOSIS — K219 Gastro-esophageal reflux disease without esophagitis: Secondary | ICD-10-CM | POA: Diagnosis not present

## 2016-11-18 DIAGNOSIS — R5383 Other fatigue: Secondary | ICD-10-CM | POA: Diagnosis not present

## 2016-11-18 DIAGNOSIS — D500 Iron deficiency anemia secondary to blood loss (chronic): Secondary | ICD-10-CM | POA: Diagnosis not present

## 2016-11-18 DIAGNOSIS — Z806 Family history of leukemia: Secondary | ICD-10-CM

## 2016-11-18 DIAGNOSIS — E785 Hyperlipidemia, unspecified: Secondary | ICD-10-CM | POA: Diagnosis not present

## 2016-11-18 DIAGNOSIS — Z803 Family history of malignant neoplasm of breast: Secondary | ICD-10-CM

## 2016-11-18 DIAGNOSIS — G4733 Obstructive sleep apnea (adult) (pediatric): Secondary | ICD-10-CM

## 2016-11-18 DIAGNOSIS — Z7982 Long term (current) use of aspirin: Secondary | ICD-10-CM

## 2016-11-18 DIAGNOSIS — N529 Male erectile dysfunction, unspecified: Secondary | ICD-10-CM | POA: Diagnosis not present

## 2016-11-18 DIAGNOSIS — G629 Polyneuropathy, unspecified: Secondary | ICD-10-CM

## 2016-11-18 MED ORDER — SODIUM CHLORIDE 0.9% FLUSH
10.0000 mL | INTRAVENOUS | Status: DC | PRN
  Filled 2016-11-18: qty 10

## 2016-11-18 MED ORDER — SODIUM CHLORIDE 0.9 % IV SOLN
INTRAVENOUS | Status: DC
  Administered 2016-11-18: 14:00:00 via INTRAVENOUS
  Filled 2016-11-18: qty 1000

## 2016-11-18 MED ORDER — IRON SUCROSE 20 MG/ML IV SOLN
200.0000 mg | INTRAVENOUS | Status: DC
  Administered 2016-11-18: 200 mg via INTRAVENOUS
  Filled 2016-11-18: qty 10

## 2016-11-18 NOTE — Progress Notes (Signed)
Pt in today for follow up, reports improved energy levels.  Denies any concerns at this time.

## 2016-11-18 NOTE — Progress Notes (Signed)
Hematology/Oncology Follow Up [facility] Telephone:([phone] [phone]   Patient Care Team: Kirk Ruths, MD as PCP - General (Internal Medicine)  REFERRING PROVIDER: Kirk Ruths, MD  CHIEF COMPLAINTS/REASON FOR VISIT  Follow up for treatment of anemia  HISTORY OF PRESENTING ILLNESS:  Patient is a  71 y.o.  male with PMH listed below who presents for follow up of his evaluation and treatment of anemia.  Extensive medical records review was performed and HemOnc related problems and patient's history are listed below. Reviewed by me today.  1 Iron deficiency anemia: patient was found to have iron deficiency in April 2018 with ferritin of 5, Hb of 8-9, At that time he presented to hospital due to unstable angina, was found to have guaiac positive stools and melena. EGD/Colonoscopy were done and there was duodenitis and small duodenal polyps. He is status post 1 dose of Feraheme when he was hospitalized back in April. He also had parental Venofer x4.   2 Chronic GI blood loss: Duodenitis, he is on PPI. Capsule study of small bowel was just done. Result pending.  3 coronary artery disease EF 45% and history of CABG (x 3 in 1999). Unstable angina this April due to anemia. Currently stable and he denies any chest pain.  4 Fatigue: fatigue significantly improved after IV iron infusion. He also reports his exercise endurance has significantly improved as well.  Review of Systems - Oncology  Constitutional: Negative for fever, night sweats,change in appetite. unintentional weight loss,  HENT: Negative for ear pain, hearing loss, nasal bleeding Eyes: Negative for eye pain, double vision   Respiratory: Negative for wheezing, shortness of breath, cough Cardiovascular: Negative for chest pain, palpitation.   Gastrointestinal: Negative abdominal pain, diarrhea, nausea vomiting Endocrine: Negative  Genitourinary: Negative for dysuria,  hematuria, frequency Skin: Negative for rash, iching, bruising Neurological: Negative for headache, dizziness, seizure Hematological: Negative for easy bruising/bleeding, lymph node enlargement Psychiatric/Behavioral: Negative for depression, anxiety, suicidality MEDICAL HISTORY:  Past Medical History:  Diagnosis Date  . Angina   . Arthritis   . Blind right eye   . CAD (coronary artery disease)   . ED (erectile dysfunction)   . GERD (gastroesophageal reflux disease)   . Glaucoma    RT eye  . Goiter    removed  . 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 Hyperlipidemia   . Hypertension   . Iron deficiency anemia due to chronic blood loss 10/14/2016  . Myocardial infarction ([place]) 1999  . Peripheral neuropathy   . Previous back surgery    discs placed in cervical spine  . Rotator cuff syndrome   . Shoulder pain, left   . Sleep apnea, obstructive    uses BIPAP at night    SURGICAL HISTORY: Past Surgical History:  Procedure Laterality Date  . CORONARY ARTERY BYPASS GRAFT  1999  . EYE SURGERY  08/09   Cataract Removal, bilateral  . HERNIA REPAIR     Inguinal Herniorhaphy  . PARTIAL HIP ARTHROPLASTY Left   . SPINE SURGERY  10/09   C3-4, C4-5 diskectomy    SOCIAL HISTORY: Social History   Socioeconomic History  . Marital status: Married    Spouse name: Not on file  . Number of children: Not on file  . Years of education: Not on file  . Highest education level: Not on file  Social Needs  . Financial resource strain: Not on file  . Food insecurity - worry: Not  on file  . Food insecurity - inability: Not on file  . Transportation needs - medical: Not on file  . Transportation needs - non-medical: Not on file  Occupational History  . Not on file  Tobacco Use  . Smoking status: Never Smoker  . Smokeless tobacco: Never Used  Substance and Sexual Activity  . Alcohol use: Yes    Comment: once a month, a few at a time  . Drug use: No    Comment: in past, 30 years ago  .  Sexual activity: Yes  Other Topics Concern  . Not on file  Social History Narrative  . Not on file    FAMILY HISTORY: Family History  Problem Relation Age of Onset  . Heart attack Mother        Died at 62 with MI  . Heart attack Father        Died at unknown age with MI  . Heart disease Sister        CABG  . Leukemia Sister   . Heart attack Brother   . Lung cancer Brother   . Heart attack Sister   . Leukemia Sister   . Stroke Sister     ALLERGIES:  has No Known Allergies.  MEDICATIONS:  Current Outpatient Medications  Medication Sig Dispense Refill  . acetaminophen (TYLENOL) 500 MG tablet Take 1,000 mg by mouth every 6 (six) hours as needed.    Marland Kitchen amLODipine (NORVASC) 10 MG tablet TAKE ONE TABLET BY MOUTH ONE TIME DAILY  30 tablet 0  . aspirin EC 81 MG tablet Take 81 mg by mouth daily.    Marland Kitchen atorvastatin (LIPITOR) 80 MG tablet Take 80 mg by mouth daily at 6 PM.     . carvedilol (COREG) 12.5 MG tablet Take 1 tablet (12.5 mg total) by mouth 2 (two) times daily with a meal. 60 tablet 6  . isosorbide mononitrate (IMDUR) 30 MG 24 hr tablet Take 2 tablets (60 mg total) by mouth daily. 30 tablet 0  . lisinopril (PRINIVIL,ZESTRIL) 40 MG tablet Take one tablet by mouth one time daily 90 tablet 9  . nitroGLYCERIN (NITROSTAT) 0.4 MG SL tablet Place 1 tablet (0.4 mg total) under the tongue every 5 (five) minutes. Up to 3 times as needed for chest pain 25 tablet 3  . polyethylene glycol powder (GLYCOLAX/MIRALAX) powder Take 255 g by mouth daily. 255 g 3  . pravastatin (PRAVACHOL) 80 MG tablet Take 1 tablet (80 mg total) by mouth daily. 90 tablet 4  . timolol (TIMOPTIC) 0.5 % ophthalmic solution Administer 1 drop to the right eye Two (2) times a day.    . ferrous sulfate 325 (65 FE) MG tablet Take 325 mg by mouth daily with breakfast.    . pantoprazole (PROTONIX) 40 MG tablet Take 1 tablet (40 mg total) by mouth daily. (Patient not taking: Reported on 11/18/2016) 30 tablet 0  . traMADol  (ULTRAM) 50 MG tablet Take 1 to 2 tablets twice a day as needed for pain (Patient not taking: Reported on 11/18/2016) 60 tablet 1   No current facility-administered medications for this visit.    Facility-Administered Medications Ordered in Other Visits  Medication Dose Route Frequency Provider Last Rate Last Dose  . 0.9 %  sodium chloride infusion   Intravenous Continuous Earlie Server, MD   Stopped at 11/18/16 1442  . iron sucrose (VENOFER) 200 mg IVPB  200 mg Intravenous Weekly Earlie Server, MD   Stopped at 11/18/16 1420  .  sodium chloride flush (NS) 0.9 % injection 10 mL  10 mL Intracatheter PRN Earlie Server, MD          .  PHYSICAL EXAMINATION: ECOG PERFORMANCE STATUS: 0 - Asymptomatic Vitals:   11/18/16 1157  BP: 130/66  Pulse: 71  Resp: 18  Temp: (!) 97 F (36.1 C)   Filed Weights   11/18/16 1157  Weight: 267 lb 6 oz (121.3 kg)   GENERAL: No distress, well nourished.  SKIN:  No rashes or significant lesions  HEAD: Normocephalic, No masses, lesions, tenderness or abnormalities  EYES: Conjunctiva are pink, non icteric ENT: External ears normal ,lips , buccal mucosa, and tongue normal and mucous membranes are moist  LYMPH: No palpable cervical and axillary lymphadenopathy  LUNGS: Clear to auscultation, no crackles or wheezes HEART: Regular rate & rhythm, no murmurs, no gallops, S1 normal and S2 normal  ABDOMEN: Abdomen soft, non-tender, normal bowel sounds, I did not appreciate any  masses or organomegaly  MUSCULOSKELETAL: No CVA tenderness and no tenderness on percussion of the back or rib cage.  EXTREMITIES: No edema, no skin discoloration or tenderness NEURO: Alert & oriented, no focal motor/sensory deficits.   LABORATORY DATA:  I have reviewed the data as listed Lab Results  Component Value Date   WBC 4.2 11/17/2016   HGB 11.7 (L) 11/17/2016   HCT 36.7 (L) 11/17/2016   MCV 74.9 (L) 11/17/2016   PLT 203 11/17/2016   Recent Labs    05/11/16 0551 05/12/16 0450  05/13/16 0436 10/14/16 1105  NA 136 137 137 133*  K 3.8 3.9 3.7 4.4  CL 104 105 104 101  CO2 26 29 27 24   GLUCOSE 86 95 87 94  BUN 9 8 8 11   CREATININE 0.84 0.89 0.80 0.78  CALCIUM 8.3* 8.5* 8.6* 8.7*  GFRNONAA >60 >60 >60 >60  GFRAA >60 >60 >60 >60  PROT 6.9  --   --  8.1  ALBUMIN 3.4*  --   --  3.9  AST 16  --   --  21  ALT 12*  --   --  13*  ALKPHOS 65  --   --  68  BILITOT 0.5  --   --  0.7   Iron/TIBC/Ferritin/ %Sat    Component Value Date/Time   IRON 39 (L) 11/17/2016 1146   TIBC 342 11/17/2016 1146   FERRITIN 48 11/17/2016 1146   IRONPCTSAT 11 (L) 11/17/2016 1146   Normal folate and B12. UA is negative for hematuria.  ASSESSMENT & PLAN:  1. Iron deficiency anemia due to chronic blood loss    Patient's hemoglobin has significantly improved after 4 doses of IV iron infusion. Ferritin improved still less than 50. Discussed with patient that I recommend 2 additional treatment to further improve his iron store given his a pending GI workup.he should continue to follow-up GI. All questions were answered. The patient knows to call the clinic with any problems questions or concerns.  Return of visit: 3 months with repeat of CBC, iron TIBC and ferritin done a few days prior to visit.   Earlie Server, MD, PhD Hematology Oncology [facility] at [facility] Pager- [phone] 11/18/2016

## 2016-11-19 LAB — CELIAC PANEL 10
Antigliadin Abs, IgA: 4 units (ref 0–19)
ENDOMYSIAL ANTIBODY IGA: NEGATIVE
Gliadin IgG: 2 units (ref 0–19)
IGA: 292 mg/dL (ref 61–437)

## 2016-11-20 ENCOUNTER — Telehealth: Payer: Self-pay | Admitting: Gastroenterology

## 2016-11-20 ENCOUNTER — Telehealth: Payer: Self-pay

## 2016-11-20 ENCOUNTER — Other Ambulatory Visit: Payer: Self-pay

## 2016-11-20 NOTE — Telephone Encounter (Signed)
Does this patient need a follow up  appt with Dr. Vicente Males?

## 2016-11-20 NOTE — Telephone Encounter (Signed)
Patient confused about an appt with Dr. Vicente Males. Per Panya he is to have a EUS set up by Mariea Clonts at the [facility] and that [facility] will call him later with Capsule Study result. Patient has been informed.

## 2016-11-20 NOTE — Telephone Encounter (Signed)
  Oncology Nurse Navigator Documentation Received referral for EUS to evaluate duodenal polyp found during 5/18 EGD. Office approved date of 12/25/16. EUS scheduled at [facility] with Dr. Francella Solian. Went over instructions and copy mailed to home address. All questions answered and phone contact provided for any future questions once he receives instructions in the mail. Denies diabetes and only anticoagulant is ASA 81mg .  INSTRUCTIONS FOR ENDOSCOPIC ULTRASOUND -Your procedure has been scheduled for December 13th with Dr.Jowell at [facility]. -The hospital will contact you to pre-register over the phone.  -To get your scheduled arrival time, please call the Endoscopy unit at  [phone] between 1-3 p.m. on: December 12th   -ON THE DAY OF YOU PROCEDURE:   1. If you are scheduled for a morning procedure, nothing to drink after midnight  -If you are scheduled for an afternoon procedure, you may have clear liquids until 5 hours prior  to the procedure but no carbonated drinks or broth  2. NO FOOD THE DAY OF YOUR PROCEDURE  3. You may take your heart, seizure, blood pressure, Parkinson's or breathing medications at  6am with just enough water to get your pills down  4. Do not take any oral Diabetic medications the morning of your procedure.  5. If you are a diabetic and are using insulin, please notify your prescribing physician of this  procedure as your dose may need to be altered related to not being able to eat or drink.   5. Do not take vitamins or fish oil for 5 days before your procedure     -On the day of your procedure, come to the [facility] Admitting/Registration desk (First desk on the right) at the scheduled arrival time. You MUST have someone drive you home from your procedure. You must have a responsible adult with a valid driver's license who is on site throughout your entire procedure and who can stay with you for several hours after your procedure. You may not  go home alone in a taxi, shuttle [place] or bus, as the drivers will not be responsible for you.  --If you have any questions please call me at the above contact    Navigator Location: [facility] (11/20/16 1400)   )Navigator Encounter Type: Telephone (11/20/16 1400) Telephone: Outgoing Call (11/20/16 1400)                       Barriers/Navigation Needs: Coordination of Care (11/20/16 1400)   Interventions: Coordination of Care (11/20/16 1400)   Coordination of Care: EUS (11/20/16 1400)        Acuity: Level 2 (11/20/16 1400)   Acuity Level 2: Initial guidance, education and coordination as needed;Educational needs;Ongoing guidance and education throughout treatment as needed (11/20/16 1400)     Time Spent with Patient: 30 (11/20/16 1400)

## 2016-11-25 ENCOUNTER — Inpatient Hospital Stay: Payer: Medicare Other

## 2016-11-25 VITALS — BP 111/68 | HR 70 | Temp 97.6°F | Resp 18

## 2016-11-25 DIAGNOSIS — D500 Iron deficiency anemia secondary to blood loss (chronic): Secondary | ICD-10-CM

## 2016-11-25 MED ORDER — IRON SUCROSE 20 MG/ML IV SOLN
200.0000 mg | INTRAVENOUS | Status: DC
  Administered 2016-11-25: 200 mg via INTRAVENOUS
  Filled 2016-11-25: qty 10

## 2016-11-25 MED ORDER — SODIUM CHLORIDE 0.9 % IV SOLN
Freq: Once | INTRAVENOUS | Status: AC
  Administered 2016-11-25: 14:00:00 via INTRAVENOUS
  Filled 2016-11-25: qty 1000

## 2016-12-02 ENCOUNTER — Telehealth: Payer: Self-pay | Admitting: Gastroenterology

## 2016-12-02 NOTE — Telephone Encounter (Signed)
Patient called wanting capsule study results.

## 2016-12-08 ENCOUNTER — Telehealth: Payer: Self-pay

## 2016-12-08 ENCOUNTER — Other Ambulatory Visit: Payer: Self-pay

## 2016-12-08 NOTE — Telephone Encounter (Signed)
  Oncology Nurse Navigator Documentation Mr. Festa contacted regarding an opening in EUS schedule for 11/29. He is interested in moving his appointment from 12/13 to 11/29. Instructions reviewed and new copy mailed to home address. Navigator Location: [facility] (12/08/16 0900)   )Navigator Encounter Type: Telephone (12/08/16 0900) Telephone: Lahoma Crocker Call;Appt Confirmation/Clarification (12/08/16 0900)                               Coordination of Care: EUS (12/08/16 0900)                  Time Spent with Patient: 15 (12/08/16 0900)

## 2016-12-09 ENCOUNTER — Encounter: Payer: Self-pay | Admitting: Gastroenterology

## 2016-12-10 ENCOUNTER — Encounter: Payer: Self-pay | Admitting: *Deleted

## 2016-12-11 ENCOUNTER — Encounter
Admission: RE | Disposition: A | Discharge status: Home / Self Care | Payer: Self-pay | Source: Ambulatory Visit | Attending: Gastroenterology

## 2016-12-11 ENCOUNTER — Ambulatory Visit: Payer: Medicare Other | Admitting: Anesthesiology

## 2016-12-11 ENCOUNTER — Ambulatory Visit
Admission: RE | Admit: 2016-12-11 | Discharge: 2016-12-11 | Disposition: A | Discharge status: Home / Self Care | Payer: Medicare Other | Source: Ambulatory Visit | Attending: Gastroenterology | Admitting: Gastroenterology

## 2016-12-11 ENCOUNTER — Encounter: Payer: Self-pay | Admitting: *Deleted

## 2016-12-11 DIAGNOSIS — I10 Essential (primary) hypertension: Secondary | ICD-10-CM | POA: Insufficient documentation

## 2016-12-11 DIAGNOSIS — H5461 Unqualified visual loss, right eye, normal vision left eye: Secondary | ICD-10-CM | POA: Insufficient documentation

## 2016-12-11 DIAGNOSIS — H409 Unspecified glaucoma: Secondary | ICD-10-CM | POA: Insufficient documentation

## 2016-12-11 DIAGNOSIS — K5900 Constipation, unspecified: Secondary | ICD-10-CM | POA: Diagnosis not present

## 2016-12-11 DIAGNOSIS — K279 Peptic ulcer, site unspecified, unspecified as acute or chronic, without hemorrhage or perforation: Secondary | ICD-10-CM | POA: Insufficient documentation

## 2016-12-11 DIAGNOSIS — I25118 Atherosclerotic heart disease of native coronary artery with other forms of angina pectoris: Secondary | ICD-10-CM | POA: Diagnosis not present

## 2016-12-11 DIAGNOSIS — G4733 Obstructive sleep apnea (adult) (pediatric): Secondary | ICD-10-CM | POA: Insufficient documentation

## 2016-12-11 DIAGNOSIS — D500 Iron deficiency anemia secondary to blood loss (chronic): Secondary | ICD-10-CM | POA: Insufficient documentation

## 2016-12-11 DIAGNOSIS — K3189 Other diseases of stomach and duodenum: Secondary | ICD-10-CM | POA: Diagnosis not present

## 2016-12-11 DIAGNOSIS — I739 Peripheral vascular disease, unspecified: Secondary | ICD-10-CM | POA: Insufficient documentation

## 2016-12-11 DIAGNOSIS — Z79899 Other long term (current) drug therapy: Secondary | ICD-10-CM | POA: Insufficient documentation

## 2016-12-11 DIAGNOSIS — I252 Old myocardial infarction: Secondary | ICD-10-CM | POA: Insufficient documentation

## 2016-12-11 DIAGNOSIS — G629 Polyneuropathy, unspecified: Secondary | ICD-10-CM | POA: Insufficient documentation

## 2016-12-11 DIAGNOSIS — Z9989 Dependence on other enabling machines and devices: Secondary | ICD-10-CM | POA: Diagnosis not present

## 2016-12-11 DIAGNOSIS — Z951 Presence of aortocoronary bypass graft: Secondary | ICD-10-CM | POA: Insufficient documentation

## 2016-12-11 DIAGNOSIS — M1990 Unspecified osteoarthritis, unspecified site: Secondary | ICD-10-CM | POA: Insufficient documentation

## 2016-12-11 DIAGNOSIS — N529 Male erectile dysfunction, unspecified: Secondary | ICD-10-CM | POA: Diagnosis not present

## 2016-12-11 DIAGNOSIS — Z7982 Long term (current) use of aspirin: Secondary | ICD-10-CM | POA: Insufficient documentation

## 2016-12-11 DIAGNOSIS — E785 Hyperlipidemia, unspecified: Secondary | ICD-10-CM | POA: Diagnosis not present

## 2016-12-11 DIAGNOSIS — K219 Gastro-esophageal reflux disease without esophagitis: Secondary | ICD-10-CM | POA: Insufficient documentation

## 2016-12-11 DIAGNOSIS — K317 Polyp of stomach and duodenum: Secondary | ICD-10-CM | POA: Diagnosis present

## 2016-12-11 HISTORY — PX: EUS: SHX5427

## 2016-12-11 SURGERY — ULTRASOUND, UPPER GI TRACT, ENDOSCOPIC
Anesthesia: General

## 2016-12-11 MED ORDER — PROPOFOL 500 MG/50ML IV EMUL
INTRAVENOUS | Status: DC | PRN
  Administered 2016-12-11: 150 ug/kg/min via INTRAVENOUS

## 2016-12-11 MED ORDER — PROPOFOL 500 MG/50ML IV EMUL
INTRAVENOUS | Status: AC
  Filled 2016-12-11: qty 50

## 2016-12-11 MED ORDER — PROPOFOL 10 MG/ML IV BOLUS
INTRAVENOUS | Status: DC | PRN
  Administered 2016-12-11: 100 mg via INTRAVENOUS

## 2016-12-11 MED ORDER — SODIUM CHLORIDE 0.9 % IV SOLN
INTRAVENOUS | Status: DC
  Administered 2016-12-11: 14:00:00 via INTRAVENOUS

## 2016-12-11 MED ORDER — FENTANYL CITRATE (PF) 100 MCG/2ML IJ SOLN
INTRAMUSCULAR | Status: DC | PRN
  Administered 2016-12-11 (×2): 50 ug via INTRAVENOUS

## 2016-12-11 MED ORDER — LIDOCAINE 2% (20 MG/ML) 5 ML SYRINGE
INTRAMUSCULAR | Status: DC | PRN
  Administered 2016-12-11: 40 mg via INTRAVENOUS

## 2016-12-11 MED ORDER — FENTANYL CITRATE (PF) 100 MCG/2ML IJ SOLN
INTRAMUSCULAR | Status: AC
  Filled 2016-12-11: qty 2

## 2016-12-11 NOTE — Interval H&P Note (Signed)
History and Physical Interval Note:  12/11/2016 1:53 PM  William [name].  has presented today for surgery, with the diagnosis of Duodenal polyp  The various methods of treatment have been discussed with the patient and family. After consideration of risks, benefits and other options for treatment, the patient has consented to  Procedure(s): FULL UPPER ENDOSCOPIC ULTRASOUND (EUS) RADIAL (N/A) as a surgical intervention .  The patient's history has been reviewed, patient examined, no change in status, stable for surgery.  I have reviewed the patient's chart and labs.  Questions were answered to the patient's satisfaction.     Zada Girt

## 2016-12-11 NOTE — Op Note (Signed)
[facility] Gastroenterology Patient Name: William [name] Procedure Date: 12/11/2016 2:01 PM MRN: [id number] Account #: [id number] Date of Birth: 10/02/1945 Admit Type: Outpatient Age: 71 Room: [facility] ENDO ROOM 3 Gender: Male Note Status: Finalized Procedure:            Upper EUS Indications:          Duodenal deformity on endoscopy/Subepithelial tumor vs.                        extrinsic compression Providers:            Zada Girt Referring MD:         [name] MD, MD (Referring MD) Medicines:            Monitored Anesthesia Care Complications:        No immediate complications. Procedure:            Pre-Anesthesia Assessment:                       - Please see pre-anesthesia assessment documentation                        already completed in Epic.                       After obtaining informed consent, the endoscope was                        passed under direct vision. Throughout the procedure,                        the patient's blood pressure, pulse, and oxygen                        saturations were monitored continuously. The EUS GI                        Radial Array [id number] was introduced through the mouth,                        and advanced to the duodenum for ultrasound                        examination. The Endoscope was introduced through the                        mouth, and advanced to the third part of duodenum. The                        upper EUS was accomplished without difficulty. The                        patient tolerated the procedure well. Findings:      Endoscopic Finding :      The examined esophagus was endoscopically normal.      The entire examined stomach was endoscopically normal.      A small submucosal mass with no bleeding was found in the third portion       of the duodenum.      Endosonographic Finding :      An oval intramural (subepithelial) lesion was found in  the third portion       of the duodenum. The lesion  was anechoic. Endosonographically, the       lesion appeared to originate from within the intramural wall, but the       wall layer could not be determined. The lesion measured 8 mm (in maximum       thickness). The lesion also measured 7 mm in diameter. The outer margins       were well defined. An intact interface was seen between the mass and the       adjacent structures suggesting a lack of invasion. Color doppler       confirmed no blood flow in the anechoic lesions. Impression:           - Normal esophagus.                       - Normal stomach.                       - Duodenal submucosal lesion.                       EUS:                       - An intramural (subepithelial) lesion was found in the                        third portion of the duodenum. The lesion appeared to                        originate from within the intramural wall, but the wall                        layer could not be determined. Tissue has not been                        obtained. 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                       - No specimens collected. Recommendation:       - Return to referring physician.                       - Patient has a contact number available for                        emergencies. The signs and symptoms of potential                        delayed complications were discussed with the patient.                        Return to normal activities tomorrow. Written discharge                        instructions were provided to the patient.                       -  The findings and recommendations were discussed with                        the patient.                       - The findings and recommendations were discussed with                        the patient's family. Procedure Code(s):    --- Professional ---                       [phone], Esophagogastroduodenoscopy, flexible,  transoral;                        with endoscopic ultrasound examination limited to the                        esophagus, stomach or duodenum, and adjacent structures Diagnosis Code(s):    --- Professional ---                       K31.89, Other diseases of stomach and duodenum CPT copyright 2016 American Medical Association. All rights reserved. The codes documented in this report are preliminary and upon coder review may  be revised to meet current compliance requirements. Attending Participation:      I personally performed the entire procedure. Zada Girt,  12/11/2016 2:46:34 PM This report has been signed electronically. Number of Addenda: 0 Note Initiated On: 12/11/2016 2:01 PM      [facility]

## 2016-12-11 NOTE — Anesthesia Preprocedure Evaluation (Signed)
Anesthesia Evaluation  Patient identified by MRN, date of birth, ID band Patient awake    Reviewed: Allergy & Precautions, H&P , NPO status , Patient's Chart, lab work & pertinent test results, reviewed documented beta blocker date and time   Airway Mallampati: II   Neck ROM: full    Dental  (+) Poor Dentition   Pulmonary neg pulmonary ROS, shortness of breath, sleep apnea and Continuous Positive Airway Pressure Ventilation ,    Pulmonary exam normal        Cardiovascular Exercise Tolerance: Poor hypertension, + angina with exertion + CAD, + Past MI and + Peripheral Vascular Disease  negative cardio ROS Normal cardiovascular exam Rhythm:regular Rate:Normal     Neuro/Psych PSYCHIATRIC DISORDERS  Neuromuscular disease negative neurological ROS  negative psych ROS   GI/Hepatic negative GI ROS, Neg liver ROS, PUD, GERD  ,  Endo/Other  negative endocrine ROS  Renal/GU negative Renal ROS  negative genitourinary   Musculoskeletal   Abdominal   Peds  Hematology negative hematology ROS (+) anemia ,   Anesthesia Other Findings Past Medical History: No date: Angina No date: Arthritis 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 No date: CAD (coronary artery disease) No date: ED (erectile dysfunction) No date: GERD (gastroesophageal reflux disease) No date: Glaucoma     Comment:  RT eye No date: Goiter     Comment:  removed 05/12/2016: Hx of CABG     Comment:  x3 1999 No date: Hyperlipidemia No date: Hypertension 10/14/2016: Iron deficiency anemia due to chronic blood loss 1999: Myocardial infarction ([place]) No date: Peripheral neuropathy No date: Previous back surgery     Comment:  discs placed in cervical spine No date: Rotator cuff syndrome No date: Shoulder pain, left No date: Sleep apnea, obstructive     Comment:  uses BIPAP at night Past Surgical History: 05/05/2011: COLONOSCOPY     Comment:  Procedure: COLONOSCOPY;  Surgeon:  Lear Ng,               MD;  Location: [facility];  Service: Endoscopy;                Laterality: N/A; 05/13/2016: COLONOSCOPY WITH PROPOFOL; N/A     Comment:  Procedure: COLONOSCOPY WITH PROPOFOL;  Surgeon: [name], MD;  Location: [facility] ENDOSCOPY;  Service:               Gastroenterology;  Laterality: N/A; 1999: CORONARY ARTERY BYPASS GRAFT 05/05/2011: ESOPHAGOGASTRODUODENOSCOPY     Comment:  Procedure: ESOPHAGOGASTRODUODENOSCOPY (EGD);  Surgeon:               Lear Ng, MD;  Location: [facility] ENDOSCOPY;                Service: Endoscopy;  Laterality: N/A; 05/13/2016: ESOPHAGOGASTRODUODENOSCOPY; N/A     Comment:  Procedure: ESOPHAGOGASTRODUODENOSCOPY (EGD);  Surgeon:               [name], MD;  Location: [facility] ENDOSCOPY;  Service:               Gastroenterology;  Laterality: N/A; 08/09: EYE SURGERY     Comment:  Cataract Removal, bilateral 11/17/2016: GIVENS CAPSULE STUDY; N/A     Comment:  Procedure: GIVENS CAPSULE STUDY;  Surgeon: [name],               MD;  Location: [facility] ENDOSCOPY;  Service:  Gastroenterology;  Laterality: N/A; No date: HERNIA REPAIR     Comment:  Inguinal Herniorhaphy 08/24/2013: LEFT HEART CATHETERIZATION WITH CORONARY ANGIOGRAM; N/A     Comment:  Procedure: LEFT HEART CATHETERIZATION WITH CORONARY               ANGIOGRAM;  Surgeon: Troy Sine, MD;  Location: [facility]               CATH LAB;  Service: Cardiovascular;  Laterality: N/A; 03/09/2011: LEFT HEART CATHETERIZATION WITH CORONARY/GRAFT ANGIOGRAM;  Right     Comment:  Procedure: LEFT HEART CATHETERIZATION WITH               Beatrix Fetters;  Surgeon: Lorretta Harp, MD;              Location: [facility] CATH LAB;  Service: Cardiovascular;                Laterality: Right; No date: PARTIAL HIP ARTHROPLASTY; Left 10/09: SPINE SURGERY     Comment:  C3-4, C4-5 diskectomy BMI    Body Mass Index:  38.31 kg/m     Reproductive/Obstetrics negative OB ROS                              Anesthesia Physical Anesthesia Plan  ASA: IV  Anesthesia Plan: General   Post-op Pain Management:    Induction:   PONV Risk Score and Plan:   Airway Management Planned:   Additional Equipment:   Intra-op Plan:   Post-operative Plan:   Informed Consent: I have reviewed the patients History and Physical, chart, labs and discussed the procedure including the risks, benefits and alternatives for the proposed anesthesia with the patient or authorized representative who has indicated his/her understanding and acceptance.   Dental Advisory Given  Plan Discussed with: CRNA  Anesthesia Plan Comments:         Anesthesia Quick Evaluation

## 2016-12-11 NOTE — Anesthesia Post-op Follow-up Note (Signed)
Anesthesia QCDR form completed.        

## 2016-12-11 NOTE — Transfer of Care (Signed)
Immediate Anesthesia Transfer of Care Note  Patient: William [name].  Procedure(s) Performed: FULL UPPER ENDOSCOPIC ULTRASOUND (EUS) RADIAL (N/A )  Patient Location: PACU and Endoscopy Unit  Anesthesia Type:General  Level of Consciousness: sedated  Airway & Oxygen Therapy: Patient Spontanous Breathing and Patient connected to nasal cannula oxygen  Post-op Assessment: Report given to RN and Post -op Vital signs reviewed and stable  Post vital signs: Reviewed and stable  Last Vitals:  Vitals:   12/11/16 1312  BP: (!) 321/80  Pulse: 66  Resp: 18  Temp: (!) 36.1 C  SpO2: 100%    Last Pain:  Vitals:   12/11/16 1312  TempSrc: Tympanic         Complications: No apparent anesthesia complications

## 2016-12-12 NOTE — Anesthesia Postprocedure Evaluation (Signed)
Anesthesia Post Note  Patient: William [name].  Procedure(s) Performed: FULL UPPER ENDOSCOPIC ULTRASOUND (EUS) RADIAL (N/A )  Patient location during evaluation: PACU Anesthesia Type: General Level of consciousness: awake and alert Pain management: pain level controlled Vital Signs Assessment: post-procedure vital signs reviewed and stable Respiratory status: spontaneous breathing, nonlabored ventilation, respiratory function stable and patient connected to nasal cannula oxygen Cardiovascular status: blood pressure returned to baseline and stable Postop Assessment: no apparent nausea or vomiting Anesthetic complications: no     Last Vitals:  Vitals:   12/11/16 1459 12/11/16 1509  BP: 129/84 (!) 146/81  Pulse: 69 70  Resp: 20 16  Temp:    SpO2: 95% 97%    Last Pain:  Vitals:   12/12/16 0744  TempSrc:   PainSc: 0-No pain                 Molli Barrows

## 2016-12-15 NOTE — Progress Notes (Signed)
EUS results routed to Dr. Vicente Males. Oncology Nurse Navigator Documentation  Navigator Location: [facility] (12/15/16 0900)   )Navigator Encounter Type: Other (12/15/16 0900)                                 Coordination of Care: EUS (12/15/16 0900)                  Time Spent with Patient: 15 (12/15/16 0900)

## 2017-02-13 ENCOUNTER — Other Ambulatory Visit: Payer: Self-pay | Admitting: Oncology

## 2017-02-13 ENCOUNTER — Inpatient Hospital Stay: Payer: Medicare Other | Attending: Oncology

## 2017-02-13 DIAGNOSIS — Z806 Family history of leukemia: Secondary | ICD-10-CM | POA: Diagnosis not present

## 2017-02-13 DIAGNOSIS — D500 Iron deficiency anemia secondary to blood loss (chronic): Secondary | ICD-10-CM | POA: Insufficient documentation

## 2017-02-13 DIAGNOSIS — G4730 Sleep apnea, unspecified: Secondary | ICD-10-CM | POA: Insufficient documentation

## 2017-02-13 DIAGNOSIS — I2510 Atherosclerotic heart disease of native coronary artery without angina pectoris: Secondary | ICD-10-CM | POA: Diagnosis not present

## 2017-02-13 DIAGNOSIS — Z7982 Long term (current) use of aspirin: Secondary | ICD-10-CM | POA: Insufficient documentation

## 2017-02-13 DIAGNOSIS — I252 Old myocardial infarction: Secondary | ICD-10-CM | POA: Insufficient documentation

## 2017-02-13 DIAGNOSIS — M129 Arthropathy, unspecified: Secondary | ICD-10-CM | POA: Insufficient documentation

## 2017-02-13 DIAGNOSIS — I10 Essential (primary) hypertension: Secondary | ICD-10-CM | POA: Insufficient documentation

## 2017-02-13 DIAGNOSIS — Z801 Family history of malignant neoplasm of trachea, bronchus and lung: Secondary | ICD-10-CM | POA: Insufficient documentation

## 2017-02-13 DIAGNOSIS — K219 Gastro-esophageal reflux disease without esophagitis: Secondary | ICD-10-CM | POA: Insufficient documentation

## 2017-02-13 DIAGNOSIS — E785 Hyperlipidemia, unspecified: Secondary | ICD-10-CM | POA: Diagnosis not present

## 2017-02-13 DIAGNOSIS — K2981 Duodenitis with bleeding: Secondary | ICD-10-CM | POA: Insufficient documentation

## 2017-02-13 DIAGNOSIS — Z79899 Other long term (current) drug therapy: Secondary | ICD-10-CM | POA: Diagnosis not present

## 2017-02-13 DIAGNOSIS — G629 Polyneuropathy, unspecified: Secondary | ICD-10-CM | POA: Insufficient documentation

## 2017-02-13 LAB — CBC WITH DIFFERENTIAL/PLATELET
Basophils Absolute: 0 10*3/uL (ref 0–0.1)
Basophils Relative: 1 %
Eosinophils Absolute: 0.2 10*3/uL (ref 0–0.7)
Eosinophils Relative: 4 %
HEMATOCRIT: 33 % — AB (ref 40.0–52.0)
Hemoglobin: 10.9 g/dL — ABNORMAL LOW (ref 13.0–18.0)
LYMPHS ABS: 1.1 10*3/uL (ref 1.0–3.6)
LYMPHS PCT: 28 %
MCH: 25.5 pg — AB (ref 26.0–34.0)
MCHC: 33.1 g/dL (ref 32.0–36.0)
MCV: 77 fL — AB (ref 80.0–100.0)
MONO ABS: 0.4 10*3/uL (ref 0.2–1.0)
Monocytes Relative: 10 %
Neutro Abs: 2.3 10*3/uL (ref 1.4–6.5)
Neutrophils Relative %: 57 %
PLATELETS: 184 10*3/uL (ref 150–440)
RBC: 4.28 MIL/uL — AB (ref 4.40–5.90)
RDW: 15.8 % — AB (ref 11.5–14.5)
WBC: 4 10*3/uL (ref 3.8–10.6)

## 2017-02-13 LAB — IRON AND TIBC
Iron: 34 ug/dL — ABNORMAL LOW (ref 45–182)
Saturation Ratios: 10 % — ABNORMAL LOW (ref 17.9–39.5)
TIBC: 345 ug/dL (ref 250–450)
UIBC: 311 ug/dL

## 2017-02-13 LAB — FERRITIN: FERRITIN: 8 ng/mL — AB (ref 24–336)

## 2017-02-17 ENCOUNTER — Inpatient Hospital Stay: Payer: Medicare Other | Admitting: Oncology

## 2017-02-17 NOTE — Progress Notes (Signed)
Hematology/Oncology Follow Up [facility] [phone] [phone]   Patient Care Team: Kirk Ruths, MD as PCP - General (Internal Medicine)  REFERRING PROVIDER: Kirk Ruths, MD  CHIEF COMPLAINTS/REASON FOR VISIT  Follow up for treatment of anemia  HISTORY OF PRESENTING ILLNESS:  Patient is a  72 y.o.  male with PMH listed below who presents for follow up of his evaluation and treatment of anemia.  Extensive medical records review was performed and HemOnc related problems and patient's history are listed below. Reviewed by me today.  1 Iron deficiency anemia: patient was found to have iron deficiency in April 2018 with ferritin of 5, Hb of 8-9, At that time he presented to hospital due to unstable angina, was found to have guaiac positive stools and melena. EGD/Colonoscopy were done and there was duodenitis and small duodenal polyps. He is status post 1 dose of Feraheme when he was hospitalized back in April. He also had parental IV venofer, iron store improved, however most recent ferritin showed further decreased, indicating ongoing blood loss.   2 Chronic GI blood loss: Duodenitis, he is on PPI. 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  3 coronary artery disease EF 45% and history of CABG (x 3 in 1999). Unstable angina this April due to anemia. Currently stable and denies any chest pain.  4 Fatigue: fatigue significantly improved after IV iron infusion, however recently started to feel more fatigued. .   Review of Systems  Constitutional: Positive for fatigue. Negative for appetite change and chills.  HENT:   Negative for hearing loss, lump/mass and tinnitus.   Eyes: Negative for eye problems.  Respiratory: Negative for chest tightness and hemoptysis.   Gastrointestinal: Negative for abdominal distention and blood in stool.  Endocrine: Negative for hot flashes.    Genitourinary: Negative for difficulty urinating and nocturia.   Musculoskeletal: Negative for arthralgias.  Skin: Negative for itching.  Neurological: Negative for dizziness.  Hematological: Negative for adenopathy.  Psychiatric/Behavioral: Negative for confusion. The patient is not nervous/anxious.      MEDICAL HISTORY:  Past Medical History:  Diagnosis Date  . Angina   . Arthritis   . Blind right eye   . CAD (coronary artery disease)   . ED (erectile dysfunction)   . GERD (gastroesophageal reflux disease)   . Glaucoma    RT eye  . Goiter    removed  . 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 Hyperlipidemia   . Hypertension   . Iron deficiency anemia due to chronic blood loss 10/14/2016  . Myocardial infarction ([place]) 1999  . Peripheral neuropathy   . Previous back surgery    discs placed in cervical spine  . Rotator cuff syndrome   . Shoulder pain, left   . Sleep apnea, obstructive    uses BIPAP at night    SURGICAL HISTORY: Past Surgical History:  Procedure Laterality Date  . COLONOSCOPY  05/05/2011   Procedure: COLONOSCOPY;  Surgeon: Lear Ng, MD;  Location: [facility] ENDOSCOPY;  Service: Endoscopy;  Laterality: N/A;  . COLONOSCOPY WITH PROPOFOL N/A 05/13/2016   Procedure: COLONOSCOPY WITH PROPOFOL;  Surgeon: [name], MD;  Location: [facility] ENDOSCOPY;  Service: Gastroenterology;  Laterality: N/A;  . CORONARY ARTERY BYPASS GRAFT  1999  . ESOPHAGOGASTRODUODENOSCOPY  05/05/2011   Procedure: ESOPHAGOGASTRODUODENOSCOPY (EGD);  Surgeon: Lear Ng, MD;  Location: [facility] ENDOSCOPY;  Service: Endoscopy;  Laterality: N/A;  .  ESOPHAGOGASTRODUODENOSCOPY N/A 05/13/2016   Procedure: ESOPHAGOGASTRODUODENOSCOPY (EGD);  Surgeon: [name], MD;  Location: [facility] ENDOSCOPY;  Service: Gastroenterology;  Laterality: N/A;  . EUS N/A 12/11/2016   Procedure: FULL UPPER ENDOSCOPIC ULTRASOUND (EUS) RADIAL;  Surgeon: Jola Schmidt, MD;  Location: [facility] ENDOSCOPY;  Service: Endoscopy;   Laterality: N/A;  . EYE SURGERY  08/09   Cataract Removal, bilateral  . GIVENS CAPSULE STUDY N/A 11/17/2016   Procedure: GIVENS CAPSULE STUDY;  Surgeon: [name], MD;  Location: [facility] ENDOSCOPY;  Service: Gastroenterology;  Laterality: N/A;  . HERNIA REPAIR     Inguinal Herniorhaphy  . LEFT HEART CATHETERIZATION WITH CORONARY ANGIOGRAM N/A 08/24/2013   Procedure: LEFT HEART CATHETERIZATION WITH CORONARY ANGIOGRAM;  Surgeon: Troy Sine, MD;  Location: [facility] CATH LAB;  Service: Cardiovascular;  Laterality: N/A;  . LEFT HEART CATHETERIZATION WITH CORONARY/GRAFT ANGIOGRAM Right 03/09/2011   Procedure: LEFT HEART CATHETERIZATION WITH Beatrix Fetters;  Surgeon: Lorretta Harp, MD;  Location: [facility] CATH LAB;  Service: Cardiovascular;  Laterality: Right;  . PARTIAL HIP ARTHROPLASTY Left   . SPINE SURGERY  10/09   C3-4, C4-5 diskectomy    SOCIAL HISTORY: Social History   Socioeconomic History  . Marital status: Married    Spouse name: Not on file  . Number of children: Not on file  . Years of education: Not on file  . Highest education level: Not on file  Social Needs  . Financial resource strain: Not on file  . Food insecurity - worry: Not on file  . Food insecurity - inability: Not on file  . Transportation needs - medical: Not on file  . Transportation needs - non-medical: Not on file  Occupational History  . Not on file  Tobacco Use  . Smoking status: Never Smoker  . Smokeless tobacco: Never Used  Substance and Sexual Activity  . Alcohol use: Yes    Comment: once a month, a few at a time  . Drug use: No    Comment: in past, 30 years ago  . Sexual activity: Yes  Other Topics Concern  . Not on file  Social History Narrative  . Not on file    FAMILY HISTORY: Family History  Problem Relation Age of Onset  . Heart attack Mother        Died at 34 with MI  . Heart attack Father        Died at unknown age with MI  . Heart disease Sister        CABG  . Leukemia  Sister   . Heart attack Brother   . Lung cancer Brother   . Heart attack Sister   . Leukemia Sister   . Stroke Sister     ALLERGIES:  has No Known Allergies.  MEDICATIONS:  Current Outpatient Medications  Medication Sig Dispense Refill  . acetaminophen (TYLENOL) 500 MG tablet Take 1,000 mg by mouth every 6 (six) hours as needed.    Marland Kitchen amLODipine (NORVASC) 10 MG tablet TAKE ONE TABLET BY MOUTH ONE TIME DAILY  30 tablet 0  . aspirin EC 81 MG tablet Take 81 mg by mouth daily.    Marland Kitchen atorvastatin (LIPITOR) 80 MG tablet Take 80 mg by mouth daily at 6 PM.     . carvedilol (COREG) 12.5 MG tablet Take 1 tablet (12.5 mg total) by mouth 2 (two) times daily with a meal. 60 tablet 6  . ferrous sulfate 325 (65 FE) MG tablet Take 325 mg by mouth daily with breakfast.    .  iron dextran complex in sodium chloride 0.9 % 500 mL Inject into the vein once.    . isosorbide mononitrate (IMDUR) 30 MG 24 hr tablet Take 2 tablets (60 mg total) by mouth daily. 30 tablet 0  . lisinopril (PRINIVIL,ZESTRIL) 40 MG tablet Take one tablet by mouth one time daily 90 tablet 9  . nitroGLYCERIN (NITROSTAT) 0.4 MG SL tablet Place 1 tablet (0.4 mg total) under the tongue every 5 (five) minutes. Up to 3 times as needed for chest pain 25 tablet 3  . pantoprazole (PROTONIX) 40 MG tablet Take 1 tablet (40 mg total) by mouth daily. 30 tablet 0  . polyethylene glycol powder (GLYCOLAX/MIRALAX) powder Take 255 g by mouth daily. 255 g 3  . pravastatin (PRAVACHOL) 80 MG tablet Take 1 tablet (80 mg total) by mouth daily. 90 tablet 4  . timolol (TIMOPTIC) 0.5 % ophthalmic solution Administer 1 drop to the right eye Two (2) times a day.    . traMADol (ULTRAM) 50 MG tablet Take 1 to 2 tablets twice a day as needed for pain 60 tablet 1   No current facility-administered medications for this visit.    Facility-Administered Medications Ordered in Other Visits  Medication Dose Route Frequency Provider Last Rate Last Dose  . 0.9 %  sodium  chloride infusion   Intravenous Continuous Earlie Server, MD 10 mL/hr at 02/18/17 1400        .  PHYSICAL EXAMINATION: ECOG PERFORMANCE STATUS: 1 - Symptomatic but completely ambulatory Vitals:   02/18/17 1312  BP: 119/71  Pulse: 69  Temp: (!) 95.8 F (35.4 C)   Filed Weights   02/18/17 1312  Weight: 267 lb 8 oz (121.3 kg)   Physical Exam   LABORATORY DATA:  I have reviewed the data as listed Lab Results  Component Value Date   WBC 4.0 02/13/2017   HGB 10.9 (L) 02/13/2017   HCT 33.0 (L) 02/13/2017   MCV 77.0 (L) 02/13/2017   PLT 184 02/13/2017   Recent Labs    05/11/16 0551 05/12/16 0450 05/13/16 0436 10/14/16 1105  NA 136 137 137 133*  K 3.8 3.9 3.7 4.4  CL 104 105 104 101  CO2 26 29 27 24   GLUCOSE 86 95 87 94  BUN 9 8 8 11   CREATININE 0.84 0.89 0.80 0.78  CALCIUM 8.3* 8.5* 8.6* 8.7*  GFRNONAA >60 >60 >60 >60  GFRAA >60 >60 >60 >60  PROT 6.9  --   --  8.1  ALBUMIN 3.4*  --   --  3.9  AST 16  --   --  21  ALT 12*  --   --  13*  ALKPHOS 65  --   --  68  BILITOT 0.5  --   --  0.7   Iron/TIBC/Ferritin/ %Sat    Component Value Date/Time   IRON 34 (L) 02/13/2017 1105   TIBC 345 02/13/2017 1105   FERRITIN 8 (L) 02/13/2017 1105   IRONPCTSAT 10 (L) 02/13/2017 1105   Normal folate and B12. UA is negative for hematuria.  ASSESSMENT & PLAN:  1. Iron deficiency anemia due to chronic blood loss    Patient's hemoglobin has significantly improved after 6 doses of IV Venofer, however CBC recent show decreasing of hemoglobin, worsening ferritin, suspecting ongoing blood loss.  UA was negative in November. He appears to have extensive GI work up without finding source of bleeding.  Encourage patient to continue follow up with GI. We will monitor his iron level, and  consider maintenance IV iron  Switch to Feraheme. Plan feraheme 510mg  x 1 today and repeat one dose next week.  Allergy reactions/infusion reaction including anaphylactic reaction discussed with patient.  Patient voices understanding and willing to proceed.  All questions were answered. The patient knows to call the clinic with any problems questions or concerns. Return of visit: 4 weeks repeat of CBC, iron TIBC and ferritin done a few days prior to visit.   Earlie Server, MD, PhD Hematology Oncology [facility] Pager- [phone] 02/18/2017

## 2017-02-18 ENCOUNTER — Other Ambulatory Visit: Payer: Self-pay

## 2017-02-18 ENCOUNTER — Inpatient Hospital Stay (HOSPITAL_BASED_OUTPATIENT_CLINIC_OR_DEPARTMENT_OTHER): Payer: Medicare Other | Admitting: Oncology

## 2017-02-18 ENCOUNTER — Inpatient Hospital Stay: Payer: Medicare Other

## 2017-02-18 ENCOUNTER — Encounter: Payer: Self-pay | Admitting: Oncology

## 2017-02-18 VITALS — BP 119/71 | HR 69 | Temp 95.8°F | Wt 267.5 lb

## 2017-02-18 DIAGNOSIS — Z79899 Other long term (current) drug therapy: Secondary | ICD-10-CM

## 2017-02-18 DIAGNOSIS — G4730 Sleep apnea, unspecified: Secondary | ICD-10-CM

## 2017-02-18 DIAGNOSIS — E785 Hyperlipidemia, unspecified: Secondary | ICD-10-CM

## 2017-02-18 DIAGNOSIS — I252 Old myocardial infarction: Secondary | ICD-10-CM | POA: Diagnosis not present

## 2017-02-18 DIAGNOSIS — M129 Arthropathy, unspecified: Secondary | ICD-10-CM

## 2017-02-18 DIAGNOSIS — Z7982 Long term (current) use of aspirin: Secondary | ICD-10-CM | POA: Diagnosis not present

## 2017-02-18 DIAGNOSIS — G629 Polyneuropathy, unspecified: Secondary | ICD-10-CM | POA: Diagnosis not present

## 2017-02-18 DIAGNOSIS — Z801 Family history of malignant neoplasm of trachea, bronchus and lung: Secondary | ICD-10-CM

## 2017-02-18 DIAGNOSIS — I10 Essential (primary) hypertension: Secondary | ICD-10-CM

## 2017-02-18 DIAGNOSIS — K2981 Duodenitis with bleeding: Secondary | ICD-10-CM | POA: Diagnosis not present

## 2017-02-18 DIAGNOSIS — K219 Gastro-esophageal reflux disease without esophagitis: Secondary | ICD-10-CM | POA: Diagnosis not present

## 2017-02-18 DIAGNOSIS — I2510 Atherosclerotic heart disease of native coronary artery without angina pectoris: Secondary | ICD-10-CM | POA: Diagnosis not present

## 2017-02-18 DIAGNOSIS — D500 Iron deficiency anemia secondary to blood loss (chronic): Secondary | ICD-10-CM | POA: Diagnosis not present

## 2017-02-18 DIAGNOSIS — Z806 Family history of leukemia: Secondary | ICD-10-CM

## 2017-02-18 MED ORDER — FERUMOXYTOL INJECTION 510 MG/17 ML
510.0000 mg | Freq: Once | INTRAVENOUS | Status: AC
  Administered 2017-02-18: 510 mg via INTRAVENOUS
  Filled 2017-02-18: qty 17

## 2017-02-18 MED ORDER — SODIUM CHLORIDE 0.9 % IV SOLN
INTRAVENOUS | Status: DC
  Administered 2017-02-18: 14:00:00 via INTRAVENOUS
  Filled 2017-02-18: qty 1000

## 2017-02-18 NOTE — Progress Notes (Signed)
Patient here today for follow up.   

## 2017-02-23 ENCOUNTER — Telehealth: Payer: Self-pay

## 2017-02-23 NOTE — Telephone Encounter (Signed)
LVM for patient to callback.   Need to schedule office visit to discuss results with Dr. Vicente Males.

## 2017-02-23 NOTE — Telephone Encounter (Signed)
-----   Message from [name], MD sent at 02/23/2017  9:13 AM EST ----- Regarding: RE: EUS Results Ok , lets ask him to come in- not today- wife on call need to get home. Tomorrow am good staying late     ----- Message ----- From: Leontine Locket, CMA Sent: 02/23/2017   8:52 AM To: [name], MD Subject: EUS Results                                    Dr. Vicente Males.   I can't be sure that the EUS results were discussed with the patient.  Does he need an office visit to discuss? The enteroscopy would need to be explained too right?  Please advise.    ----- Message ----- From: [name], MD Sent: 02/22/2017   5:45 PM To: Leontine Locket, CMA, Earlie Server, MD  Quanesha Klimaszewski - do you know if he was referred for small bowel enteroscopy after his capsule study to ablate AVM's??   If he hasnt he needs to be referred  Follow up with me after that   C/c Dr Tasia Catchings    Dr [name] MD,MRCP [facility]) Gastroenterology/Hepatology Pager: [phone]  ----- Message ----- From: Earlie Server, MD Sent: 02/19/2017   9:48 PM To: [name], MD  Dr.Anna,  Patient's ferritin level dropped again. Capsule study showed duodenal polyp and non bleeding AVM. Did he get EUS/ablation? I can't tell from records.  UA was checked previous and was negative.   Talbert Cage

## 2017-02-24 ENCOUNTER — Ambulatory Visit (INDEPENDENT_AMBULATORY_CARE_PROVIDER_SITE_OTHER): Payer: Medicare Other | Admitting: Gastroenterology

## 2017-02-24 DIAGNOSIS — D509 Iron deficiency anemia, unspecified: Secondary | ICD-10-CM | POA: Diagnosis not present

## 2017-02-24 DIAGNOSIS — K558 Other vascular disorders of intestine: Secondary | ICD-10-CM | POA: Diagnosis not present

## 2017-02-24 DIAGNOSIS — K5520 Angiodysplasia of colon without hemorrhage: Secondary | ICD-10-CM

## 2017-02-24 DIAGNOSIS — K5900 Constipation, unspecified: Secondary | ICD-10-CM

## 2017-02-24 NOTE — Progress Notes (Signed)
William Bellows MD, MRCP(U.K) [address]  [place]  [place], [place] [address]  Main: [phone]  Fax: [phone]   Primary Care Physician: William Ruths, MD  Primary Gastroenterologist:  Dr. Jonathon [name]   No chief complaint on file.   HPI: William [name]. is a 72 y.o. male   Summary of history:   He was  admitted on 05/10/16 with anemia at [facility] . He also had chest pain and shortness of breath and found to have a low Hb of 9.1 grams, ferritin was 5 . He had EGD and colonoscopy by Dr William [name] in 2013 for severe iron deficiency anemia when he was admitted at [facility]. EGD was negative, colonoscopy revealed polyp only. I performed an EGD and colonoscopy 05/2016  which shouwed no bleeding,I found a small duodenal polyp which appeared like a submucosal lesion, duodenitis and thickened fold at the antrum.  I found 4 adenomatous appearing polyps in the colon which were resected. Labs 05/2016 - Celiac serology -normal.    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   Says his stool is very hard. Lots of soreness. Denies any overt blood loss.  Receives IV iron with Dr William [name] . Still has constipation.    Current Outpatient Medications  Medication Sig Dispense Refill  . acetaminophen (TYLENOL) 500 MG tablet Take 1,000 mg by mouth every 6 (six) hours as needed.    Marland Kitchen amLODipine (NORVASC) 10 MG tablet TAKE ONE TABLET BY MOUTH ONE TIME DAILY  30 tablet 0  . aspirin EC 81 MG tablet Take 81 mg by mouth daily.    Marland Kitchen atorvastatin (LIPITOR) 80 MG tablet Take 80 mg by mouth daily at 6 PM.     . carvedilol (COREG) 12.5 MG tablet Take 1 tablet (12.5 mg total) by mouth 2 (two) times daily with a meal. 60 tablet 6  . ferrous sulfate 325 (65 FE) MG tablet Take 325 mg by mouth daily with breakfast.    . iron  dextran complex in sodium chloride 0.9 % 500 mL Inject into the vein once.    . isosorbide mononitrate (IMDUR) 30 MG 24 hr tablet Take 2 tablets (60 mg total) by mouth daily. 30 tablet 0  . lisinopril (PRINIVIL,ZESTRIL) 40 MG tablet Take one tablet by mouth one time daily 90 tablet 9  . nitroGLYCERIN (NITROSTAT) 0.4 MG SL tablet Place 1 tablet (0.4 mg total) under the tongue every 5 (five) minutes. Up to 3 times as needed for chest pain 25 tablet 3  . pantoprazole (PROTONIX) 40 MG tablet Take 1 tablet (40 mg total) by mouth daily. 30 tablet 0  . polyethylene glycol powder (GLYCOLAX/MIRALAX) powder Take 255 g by mouth daily. 255 g 3  . pravastatin (PRAVACHOL) 80 MG tablet Take 1 tablet (80 mg total) by mouth daily. 90 tablet 4  . timolol (TIMOPTIC) 0.5 % ophthalmic solution Administer 1 drop to the right eye Two (2) times a day.    . traMADol (ULTRAM) 50 MG tablet Take 1 to 2 tablets twice a day as needed for pain 60 tablet 1   No current facility-administered medications for this visit.     Allergies as of 02/24/2017  . (No Known Allergies)    ROS:  General: Negative for anorexia, weight loss, fever, chills, fatigue,  weakness. ENT: Negative for hoarseness, difficulty swallowing , nasal congestion. CV: Negative for chest pain, angina, palpitations, dyspnea on exertion, peripheral edema.  Respiratory: Negative for dyspnea at rest, dyspnea on exertion, cough, sputum, wheezing.  GI: See history of present illness. GU:  Negative for dysuria, hematuria, urinary incontinence, urinary frequency, nocturnal urination.  Endo: Negative for unusual weight change.    Physical Examination:   There were no vitals taken for this visit.  General: Well-nourished, well-developed in no acute distress.  Eyes: No icterus. Conjunctivae pink. Mouth: Oropharyngeal mucosa moist and pink , no lesions erythema or exudate. Lungs: Clear to auscultation bilaterally. Non-labored. Heart: Regular rate and rhythm, no  murmurs rubs or gallops.  Abdomen: Bowel sounds are normal, nontender, nondistended, no hepatosplenomegaly or masses, no abdominal bruits or hernia , no rebound or guarding.   Extremities: No lower extremity edema. No clubbing or deformities. Neuro: Alert and oriented x 3.  Grossly intact. Skin: Warm and dry, no jaundice.   Psych: Alert and cooperative, normal mood and affect.   Imaging Studies: No results found.  Assessment and Plan:   William [name]. is a 71 y.o. y/o male  here to follow up for iron deficiency anemia.EGD+colonoscopy . 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 Has constipation not responding to miralax or stool softner Plan  1. Enteroscopy at [facility] 2. Linzezz 145 mcg for two weeks samples provided    Dr William Bellows  MD,MRCP [facility]) Follow up PRN

## 2017-02-25 ENCOUNTER — Inpatient Hospital Stay: Payer: Medicare Other

## 2017-02-25 VITALS — BP 121/75 | HR 79 | Temp 96.1°F

## 2017-02-25 DIAGNOSIS — D500 Iron deficiency anemia secondary to blood loss (chronic): Secondary | ICD-10-CM

## 2017-02-25 MED ORDER — SODIUM CHLORIDE 0.9 % IV SOLN
INTRAVENOUS | Status: DC
  Administered 2017-02-25: 13:00:00 via INTRAVENOUS
  Filled 2017-02-25: qty 1000

## 2017-02-25 MED ORDER — SODIUM CHLORIDE 0.9 % IV SOLN
510.0000 mg | Freq: Once | INTRAVENOUS | Status: AC
  Administered 2017-02-25: 510 mg via INTRAVENOUS
  Filled 2017-02-25: qty 17

## 2017-03-16 ENCOUNTER — Inpatient Hospital Stay: Payer: Medicare Other | Attending: Oncology

## 2017-03-16 DIAGNOSIS — G629 Polyneuropathy, unspecified: Secondary | ICD-10-CM | POA: Insufficient documentation

## 2017-03-16 DIAGNOSIS — Z79899 Other long term (current) drug therapy: Secondary | ICD-10-CM | POA: Insufficient documentation

## 2017-03-16 DIAGNOSIS — Z7982 Long term (current) use of aspirin: Secondary | ICD-10-CM | POA: Insufficient documentation

## 2017-03-16 DIAGNOSIS — I252 Old myocardial infarction: Secondary | ICD-10-CM | POA: Diagnosis not present

## 2017-03-16 DIAGNOSIS — R42 Dizziness and giddiness: Secondary | ICD-10-CM | POA: Insufficient documentation

## 2017-03-16 DIAGNOSIS — K2980 Duodenitis without bleeding: Secondary | ICD-10-CM | POA: Diagnosis not present

## 2017-03-16 DIAGNOSIS — E785 Hyperlipidemia, unspecified: Secondary | ICD-10-CM | POA: Insufficient documentation

## 2017-03-16 DIAGNOSIS — K219 Gastro-esophageal reflux disease without esophagitis: Secondary | ICD-10-CM | POA: Diagnosis not present

## 2017-03-16 DIAGNOSIS — I10 Essential (primary) hypertension: Secondary | ICD-10-CM | POA: Diagnosis not present

## 2017-03-16 DIAGNOSIS — D500 Iron deficiency anemia secondary to blood loss (chronic): Secondary | ICD-10-CM | POA: Diagnosis not present

## 2017-03-16 DIAGNOSIS — Z86010 Personal history of colonic polyps: Secondary | ICD-10-CM | POA: Insufficient documentation

## 2017-03-16 DIAGNOSIS — Z806 Family history of leukemia: Secondary | ICD-10-CM | POA: Diagnosis not present

## 2017-03-16 DIAGNOSIS — G4733 Obstructive sleep apnea (adult) (pediatric): Secondary | ICD-10-CM | POA: Diagnosis not present

## 2017-03-16 DIAGNOSIS — I25110 Atherosclerotic heart disease of native coronary artery with unstable angina pectoris: Secondary | ICD-10-CM | POA: Insufficient documentation

## 2017-03-16 LAB — IRON AND TIBC
Iron: 46 ug/dL (ref 45–182)
SATURATION RATIOS: 18 % (ref 17.9–39.5)
TIBC: 262 ug/dL (ref 250–450)
UIBC: 216 ug/dL

## 2017-03-16 LAB — CBC
HCT: 34.5 % — ABNORMAL LOW (ref 40.0–52.0)
HEMOGLOBIN: 11.5 g/dL — AB (ref 13.0–18.0)
MCH: 27.6 pg (ref 26.0–34.0)
MCHC: 33.4 g/dL (ref 32.0–36.0)
MCV: 82.5 fL (ref 80.0–100.0)
PLATELETS: 178 10*3/uL (ref 150–440)
RBC: 4.18 MIL/uL — ABNORMAL LOW (ref 4.40–5.90)
RDW: 19.2 % — ABNORMAL HIGH (ref 11.5–14.5)
WBC: 2.2 10*3/uL — ABNORMAL LOW (ref 3.8–10.6)

## 2017-03-16 LAB — FERRITIN: FERRITIN: 221 ng/mL (ref 24–336)

## 2017-03-19 ENCOUNTER — Encounter: Payer: Self-pay | Admitting: Oncology

## 2017-03-19 ENCOUNTER — Inpatient Hospital Stay (HOSPITAL_BASED_OUTPATIENT_CLINIC_OR_DEPARTMENT_OTHER): Payer: Medicare Other | Admitting: Oncology

## 2017-03-19 VITALS — BP 102/73 | HR 73 | Temp 97.9°F | Resp 16 | Wt 264.4 lb

## 2017-03-19 DIAGNOSIS — I252 Old myocardial infarction: Secondary | ICD-10-CM

## 2017-03-19 DIAGNOSIS — G4733 Obstructive sleep apnea (adult) (pediatric): Secondary | ICD-10-CM

## 2017-03-19 DIAGNOSIS — K219 Gastro-esophageal reflux disease without esophagitis: Secondary | ICD-10-CM

## 2017-03-19 DIAGNOSIS — Z79899 Other long term (current) drug therapy: Secondary | ICD-10-CM

## 2017-03-19 DIAGNOSIS — D500 Iron deficiency anemia secondary to blood loss (chronic): Secondary | ICD-10-CM | POA: Diagnosis not present

## 2017-03-19 DIAGNOSIS — Z806 Family history of leukemia: Secondary | ICD-10-CM

## 2017-03-19 DIAGNOSIS — R42 Dizziness and giddiness: Secondary | ICD-10-CM | POA: Diagnosis not present

## 2017-03-19 DIAGNOSIS — E785 Hyperlipidemia, unspecified: Secondary | ICD-10-CM | POA: Diagnosis not present

## 2017-03-19 DIAGNOSIS — K2980 Duodenitis without bleeding: Secondary | ICD-10-CM

## 2017-03-19 DIAGNOSIS — I10 Essential (primary) hypertension: Secondary | ICD-10-CM

## 2017-03-19 DIAGNOSIS — Z7982 Long term (current) use of aspirin: Secondary | ICD-10-CM

## 2017-03-19 DIAGNOSIS — Z86010 Personal history of colonic polyps: Secondary | ICD-10-CM | POA: Diagnosis not present

## 2017-03-19 DIAGNOSIS — G629 Polyneuropathy, unspecified: Secondary | ICD-10-CM | POA: Diagnosis not present

## 2017-03-19 DIAGNOSIS — I25110 Atherosclerotic heart disease of native coronary artery with unstable angina pectoris: Secondary | ICD-10-CM | POA: Diagnosis not present

## 2017-03-19 NOTE — Progress Notes (Signed)
Patient is here today for a follow up. Patient states that since his endoscopy on 03/12/17 at [facility], he feels "swimmy headed" at night time. He states that whenever he moves around in bed at night, he feels off balance and dizzy - and this normally resolves after about 5 minutes.

## 2017-03-19 NOTE — Progress Notes (Signed)
Hematology/Oncology Follow Up [facility] [phone] [phone]   Patient Care Team: Kirk Ruths, MD as PCP - General (Internal Medicine)  REFERRING PROVIDER: Kirk Ruths, MD  CHIEF COMPLAINTS/REASON FOR VISIT  Follow up for treatment of anemia  HISTORY OF PRESENTING ILLNESS:  Patient is a  72 y.o.  male with PMH listed below who presents for follow up of his evaluation and treatment of anemia.  Extensive medical records review was performed and HemOnc related problems and patient's history are listed below. Reviewed by me today.  1 Iron deficiency anemia: patient was found to have iron deficiency in April 2018 with ferritin of 5, Hb of 8-9, At that time he presented to hospital due to unstable angina, was found to have guaiac positive stools and melena. EGD/Colonoscopy were done and there was duodenitis and small duodenal polyps. He is status post 1 dose of Feraheme when he was hospitalized back in April.  Patient has received 6 IV Venofer infusions outpatient.  Ferriheme improved however due to the ongoing blood loss, decreased again., he was given 2 more doses of Feraheme outpatient. 2 Chronic GI blood loss: Duodenitis, he is on PPI. 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  Patient is status post enteroscopy which reviewed a recently bleeding angiodysplastic lesion and treated with APC. There is another non bleeding lesion which was also treated.   3 coronary artery disease EF 45% and history of CABG (x 3 in 1999). Unstable angina April 2018 due to anemia. Currently stable and denies any chest pain.  4 dizziness: Patient reports intermittent dizziness/room spinning.  he does not had any symptoms at this moment.  Usually he has a symptom in the morning or before going to bed.  He drinks adequate fluid every day  Review of Systems  Constitutional: Negative for  appetite change, chills and fatigue.  HENT:   Negative for hearing loss, lump/mass and tinnitus.   Eyes: Negative for eye problems.  Respiratory: Negative for chest tightness and hemoptysis.   Gastrointestinal: Negative for abdominal distention and blood in stool.  Endocrine: Negative for hot flashes.  Genitourinary: Negative for difficulty urinating and nocturia.   Musculoskeletal: Negative for arthralgias.  Skin: Negative for itching.  Neurological: Positive for dizziness and light-headedness. Negative for headaches.  Hematological: Negative for adenopathy.  Psychiatric/Behavioral: Negative for confusion. The patient is not nervous/anxious.      MEDICAL HISTORY:  Past Medical History:  Diagnosis Date  . Angina   . Arthritis   . Blind right eye   . CAD (coronary artery disease)   . ED (erectile dysfunction)   . GERD (gastroesophageal reflux disease)   . Glaucoma    RT eye  . Goiter    removed  . 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 Hyperlipidemia   . Hypertension   . Iron deficiency anemia due to chronic blood loss 10/14/2016  . Myocardial infarction ([place]) 1999  . Peripheral neuropathy   . Previous back surgery    discs placed in cervical spine  . Rotator cuff syndrome   . Shoulder pain, left   . Sleep apnea, obstructive    uses BIPAP at night    SURGICAL HISTORY: Past Surgical History:  Procedure Laterality Date  . COLONOSCOPY  05/05/2011   Procedure: COLONOSCOPY;  Surgeon: Lear Ng, MD;  Location: [facility] ENDOSCOPY;  Service: Endoscopy;  Laterality: N/A;  . COLONOSCOPY WITH  PROPOFOL N/A 05/13/2016   Procedure: COLONOSCOPY WITH PROPOFOL;  Surgeon: [name], MD;  Location: [facility] ENDOSCOPY;  Service: Gastroenterology;  Laterality: N/A;  . CORONARY ARTERY BYPASS GRAFT  1999  . ESOPHAGOGASTRODUODENOSCOPY  05/05/2011   Procedure: ESOPHAGOGASTRODUODENOSCOPY (EGD);  Surgeon: Lear Ng, MD;  Location: [facility] ENDOSCOPY;  Service: Endoscopy;  Laterality: N/A;  .  ESOPHAGOGASTRODUODENOSCOPY N/A 05/13/2016   Procedure: ESOPHAGOGASTRODUODENOSCOPY (EGD);  Surgeon: [name], MD;  Location: [facility] ENDOSCOPY;  Service: Gastroenterology;  Laterality: N/A;  . EUS N/A 12/11/2016   Procedure: FULL UPPER ENDOSCOPIC ULTRASOUND (EUS) RADIAL;  Surgeon: Jola Schmidt, MD;  Location: [facility] ENDOSCOPY;  Service: Endoscopy;  Laterality: N/A;  . EYE SURGERY  08/09   Cataract Removal, bilateral  . GIVENS CAPSULE STUDY N/A 11/17/2016   Procedure: GIVENS CAPSULE STUDY;  Surgeon: [name], MD;  Location: [facility] ENDOSCOPY;  Service: Gastroenterology;  Laterality: N/A;  . HERNIA REPAIR     Inguinal Herniorhaphy  . LEFT HEART CATHETERIZATION WITH CORONARY ANGIOGRAM N/A 08/24/2013   Procedure: LEFT HEART CATHETERIZATION WITH CORONARY ANGIOGRAM;  Surgeon: Troy Sine, MD;  Location: [facility] CATH LAB;  Service: Cardiovascular;  Laterality: N/A;  . LEFT HEART CATHETERIZATION WITH CORONARY/GRAFT ANGIOGRAM Right 03/09/2011   Procedure: LEFT HEART CATHETERIZATION WITH Beatrix Fetters;  Surgeon: Lorretta Harp, MD;  Location: [facility] CATH LAB;  Service: Cardiovascular;  Laterality: Right;  . PARTIAL HIP ARTHROPLASTY Left   . SPINE SURGERY  10/09   C3-4, C4-5 diskectomy    SOCIAL HISTORY: Social History   Socioeconomic History  . Marital status: Married    Spouse name: Not on file  . Number of children: Not on file  . Years of education: Not on file  . Highest education level: Not on file  Social Needs  . Financial resource strain: Not on file  . Food insecurity - worry: Not on file  . Food insecurity - inability: Not on file  . Transportation needs - medical: Not on file  . Transportation needs - non-medical: Not on file  Occupational History  . Not on file  Tobacco Use  . Smoking status: Never Smoker  . Smokeless tobacco: Never Used  Substance and Sexual Activity  . Alcohol use: Yes    Comment: once a month, a few at a time  . Drug use: No    Comment: in past, 30 years  ago  . Sexual activity: Yes  Other Topics Concern  . Not on file  Social History Narrative  . Not on file    FAMILY HISTORY: Family History  Problem Relation Age of Onset  . Heart attack Mother        Died at 35 with MI  . Heart attack Father        Died at unknown age with MI  . Heart disease Sister        CABG  . Leukemia Sister   . Heart attack Brother   . Lung cancer Brother   . Heart attack Sister   . Leukemia Sister   . Stroke Sister     ALLERGIES:  has No Known Allergies.  MEDICATIONS:  Current Outpatient Medications  Medication Sig Dispense Refill  . acetaminophen (TYLENOL) 500 MG tablet Take 1,000 mg by mouth every 6 (six) hours as needed.    Marland Kitchen amLODipine (NORVASC) 10 MG tablet TAKE ONE TABLET BY MOUTH ONE TIME DAILY  30 tablet 0  . aspirin EC 81 MG tablet Take 81 mg by mouth daily.    Marland Kitchen  atorvastatin (LIPITOR) 80 MG tablet Take 80 mg by mouth daily at 6 PM.     . carvedilol (COREG) 12.5 MG tablet Take 1 tablet (12.5 mg total) by mouth 2 (two) times daily with a meal. 60 tablet 6  . ferrous sulfate 325 (65 FE) MG tablet Take 325 mg by mouth daily with breakfast.    . iron dextran complex in sodium chloride 0.9 % 500 mL Inject into the vein once.    . isosorbide mononitrate (IMDUR) 30 MG 24 hr tablet Take 2 tablets (60 mg total) by mouth daily. 30 tablet 0  . lisinopril (PRINIVIL,ZESTRIL) 40 MG tablet Take one tablet by mouth one time daily 90 tablet 9  . pantoprazole (PROTONIX) 40 MG tablet Take 1 tablet (40 mg total) by mouth daily. 30 tablet 0  . pravastatin (PRAVACHOL) 80 MG tablet Take 1 tablet (80 mg total) by mouth daily. 90 tablet 4  . timolol (TIMOPTIC) 0.5 % ophthalmic solution Administer 1 drop to the right eye Two (2) times a day.    . traMADol (ULTRAM) 50 MG tablet Take 1 to 2 tablets twice a day as needed for pain 60 tablet 1  . nitroGLYCERIN (NITROSTAT) 0.4 MG SL tablet Place 1 tablet (0.4 mg total) under the tongue every 5 (five) minutes. Up to 3  times as needed for chest pain (Patient not taking: Reported on 03/19/2017) 25 tablet 3  . polyethylene glycol powder (GLYCOLAX/MIRALAX) powder Take 255 g by mouth daily. (Patient not taking: Reported on 03/19/2017) 255 g 3   No current facility-administered medications for this visit.       Marland Kitchen  PHYSICAL EXAMINATION: ECOG PERFORMANCE STATUS: 1 - Symptomatic but completely ambulatory Vitals:   03/19/17 1455  BP: 102/73  Pulse: 73  Resp: 16  Temp: 97.9 F (36.6 C)   Filed Weights   03/19/17 1455  Weight: 264 lb 6.4 oz (119.9 kg)   Physical Exam  Constitutional: He is oriented to person, place, and time and well-developed, well-nourished, and in no distress. No distress.  HENT:  Head: Normocephalic and atraumatic.  Eyes: Pupils are equal, round, and reactive to light. Left eye exhibits no discharge. No scleral icterus.  Neck: Normal range of motion. Neck supple. No tracheal deviation present. No thyromegaly present.  Cardiovascular: Normal rate and regular rhythm. Exam reveals no friction rub.  No murmur heard. Occasionally skip a beat  Pulmonary/Chest: Effort normal and breath sounds normal. No respiratory distress. He has no rales.  Abdominal: There is no rebound and no guarding.  Musculoskeletal: Normal range of motion. He exhibits no edema.  Neurological: He is alert and oriented to person, place, and time.  Skin: Skin is warm and dry. No erythema.  Psychiatric: Affect normal.     LABORATORY DATA:  I have reviewed the data as listed Lab Results  Component Value Date   WBC 2.2 (L) 03/16/2017   HGB 11.5 (L) 03/16/2017   HCT 34.5 (L) 03/16/2017   MCV 82.5 03/16/2017   PLT 178 03/16/2017   Recent Labs    05/11/16 0551 05/12/16 0450 05/13/16 0436 10/14/16 1105  NA 136 137 137 133*  K 3.8 3.9 3.7 4.4  CL 104 105 104 101  CO2 26 29 27 24   GLUCOSE 86 95 87 94  BUN 9 8 8 11   CREATININE 0.84 0.89 0.80 0.78  CALCIUM 8.3* 8.5* 8.6* 8.7*  GFRNONAA >60 >60 >60 >60    GFRAA >60 >60 >60 >60  PROT 6.9  --   --  8.1  ALBUMIN 3.4*  --   --  3.9  AST 16  --   --  21  ALT 12*  --   --  13*  ALKPHOS 65  --   --  68  BILITOT 0.5  --   --  0.7   Iron/TIBC/Ferritin/ %Sat    Component Value Date/Time   IRON 46 03/16/2017 1045   TIBC 262 03/16/2017 1045   FERRITIN 221 03/16/2017 1045   IRONPCTSAT 18 03/16/2017 1045   Normal folate and B12. UA is negative for hematuria.  ASSESSMENT & PLAN:  1. Iron deficiency anemia secondary to blood loss (chronic)    Patient's hemoglobin has significantly improved after 6 doses of IV Venofer, however CBC recent show decreasing of hemoglobin, worsening ferritin, suspecting ongoing blood loss.   S/p feraheme 510mg  x 2, hemoglobin and iron panel have significantly improved. Despite improved blood counts, patient reports feeling intermittent dizzy/lightheaded.  His vitals are stable, and is currently in the clinic he does not have any symptoms.  I advised patient to make an appointment to see his primary care physician as well as cardiologist for additional evaluation.  All questions were answered. The patient knows to call the clinic with any problems questions or concerns. Return of visit: 3 months repeat of CBC, iron TIBC and ferritin done a few days prior to visit.   Earlie Server, MD, PhD Hematology Oncology [facility] Pager- [phone] 03/19/2017

## 2017-06-16 ENCOUNTER — Other Ambulatory Visit: Payer: Medicare Other

## 2017-06-18 ENCOUNTER — Ambulatory Visit: Payer: Medicare Other | Admitting: Oncology

## 2019-10-18 ENCOUNTER — Ambulatory Visit: Payer: Medicare Other | Attending: Internal Medicine

## 2019-10-18 DIAGNOSIS — Z23 Encounter for immunization: Secondary | ICD-10-CM

## 2019-10-18 NOTE — Progress Notes (Signed)
   [id number]  Name:  William [name].    MRN: [id number] DOB: [birth year]  10/18/2019  William [name] was observed post Covid-19 immunization for 15 minutes without incident. He was provided with Vaccine Information Sheet and instruction to access the V-Safe system.   William [name] was instructed to call 911 with any severe reactions post vaccine: Marland Kitchen Difficulty breathing  . Swelling of face and throat  . A fast heartbeat  . A bad rash all over body  . Dizziness and weakness

## 2020-12-21 ENCOUNTER — Encounter (HOSPITAL_COMMUNITY): Payer: Self-pay | Admitting: Emergency Medicine

## 2020-12-21 ENCOUNTER — Emergency Department (HOSPITAL_COMMUNITY): Payer: Medicare Other

## 2020-12-21 ENCOUNTER — Other Ambulatory Visit: Payer: Self-pay

## 2020-12-21 ENCOUNTER — Emergency Department (HOSPITAL_COMMUNITY)
Admission: EM | Admit: 2020-12-21 | Discharge: 2020-12-21 | Disposition: A | Discharge status: Home / Self Care | Payer: Medicare Other | Attending: Emergency Medicine | Admitting: Emergency Medicine

## 2020-12-21 DIAGNOSIS — I2510 Atherosclerotic heart disease of native coronary artery without angina pectoris: Secondary | ICD-10-CM | POA: Insufficient documentation

## 2020-12-21 DIAGNOSIS — H8110 Benign paroxysmal vertigo, unspecified ear: Secondary | ICD-10-CM | POA: Diagnosis not present

## 2020-12-21 DIAGNOSIS — I10 Essential (primary) hypertension: Secondary | ICD-10-CM | POA: Diagnosis not present

## 2020-12-21 DIAGNOSIS — R42 Dizziness and giddiness: Secondary | ICD-10-CM

## 2020-12-21 LAB — BASIC METABOLIC PANEL
Anion gap: 8 (ref 5–15)
BUN: 11 mg/dL (ref 8–23)
CO2: 26 mmol/L (ref 22–32)
Calcium: 8.5 mg/dL — ABNORMAL LOW (ref 8.9–10.3)
Chloride: 104 mmol/L (ref 98–111)
Creatinine, Ser: 1 mg/dL (ref 0.61–1.24)
GFR, Estimated: >60 mL/min (ref >60)
Glucose, Bld: 101 mg/dL — ABNORMAL HIGH (ref 70–99)
Potassium: 4 mmol/L (ref 3.5–5.1)
Sodium: 138 mmol/L (ref 135–145)

## 2020-12-21 LAB — CBC WITH DIFFERENTIAL/PLATELET
Abs Immature Granulocytes: 0.01 10*3/uL (ref 0.00–0.07)
Basophils Absolute: 0 10*3/uL (ref 0.0–0.1)
Basophils Relative: 1 %
Eosinophils Absolute: 0.2 10*3/uL (ref 0.0–0.5)
Eosinophils Relative: 5 %
HCT: 38.9 % — ABNORMAL LOW (ref 39.0–52.0)
Hemoglobin: 13.1 g/dL (ref 13.0–17.0)
Immature Granulocytes: 0 %
Lymphocytes Relative: 36 %
Lymphs Abs: 1.4 10*3/uL (ref 0.7–4.0)
MCH: 28.9 pg (ref 26.0–34.0)
MCHC: 33.7 g/dL (ref 30.0–36.0)
MCV: 85.7 fL (ref 80.0–100.0)
Monocytes Absolute: 0.4 10*3/uL (ref 0.1–1.0)
Monocytes Relative: 11 %
Neutro Abs: 1.9 10*3/uL (ref 1.7–7.7)
Neutrophils Relative %: 47 %
Platelets: 172 10*3/uL (ref 150–400)
RBC: 4.54 MIL/uL (ref 4.22–5.81)
RDW: 13.5 % (ref 11.5–15.5)
WBC: 4 10*3/uL (ref 4.0–10.5)
nRBC: 0 % (ref 0.0–0.2)

## 2020-12-21 LAB — URINALYSIS, ROUTINE W REFLEX MICROSCOPIC
Bilirubin Urine: NEGATIVE
Glucose, UA: NEGATIVE mg/dL
Hgb urine dipstick: NEGATIVE
Ketones, ur: NEGATIVE mg/dL
Leukocytes,Ua: NEGATIVE
Nitrite: NEGATIVE
Protein, ur: NEGATIVE mg/dL
Specific Gravity, Urine: 1.025 (ref 1.005–1.030)
pH: 6 (ref 5.0–8.0)

## 2020-12-21 MED ORDER — MECLIZINE HCL 25 MG PO TABS
25.0000 mg | ORAL_TABLET | Freq: Once | ORAL | Status: AC
  Administered 2020-12-21: 25 mg via ORAL
  Filled 2020-12-21: qty 1

## 2020-12-21 MED ORDER — MECLIZINE HCL 25 MG PO TABS: 25.0000 mg | ORAL_TABLET | Freq: Three times a day (TID) | ORAL | 0 refills | Status: AC | PRN

## 2020-12-21 NOTE — ED Notes (Signed)
Patient Alert and oriented to baseline. Stable and ambulatory to baseline. Patient verbalized understanding of the discharge instructions.  Patient belongings were taken by the patient.   

## 2020-12-21 NOTE — ED Provider Notes (Signed)
[facility] EMERGENCY DEPARTMENT Provider Note  CSN: [id number] Arrival date & time: 12/21/20 9562    History Chief Complaint  Patient presents with   Dizziness    William [name]. is a 75 y.o. male William [name]. is a 75 y.o. male reports intermittent dizziness for the last couple of days, described as feeling swimmy headed/spinning, worse with lying flat. He denies any CP, has had mild SOB. No fever, cough, N/V/D. He reports he sometimes feels off balance, but has not fallen. No recent URI symptoms.    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 1999   Hyperlipidemia    Hypertension    Iron deficiency anemia due to chronic blood loss 10/14/2016   Myocardial infarction [facility]) 1999   Peripheral neuropathy    Previous back surgery    discs placed in cervical spine   Rotator cuff syndrome    Shoulder pain, left    Sleep apnea, obstructive    uses BIPAP at night    Past Surgical History:  Procedure Laterality Date   COLONOSCOPY  05/05/2011   Procedure: COLONOSCOPY;  Surgeon: Lear Ng, MD;  Location: [facility];  Service: Endoscopy;  Laterality: N/A;   COLONOSCOPY WITH PROPOFOL N/A 05/13/2016   Procedure: COLONOSCOPY WITH PROPOFOL;  Surgeon: [name], MD;  Location: [facility] ENDOSCOPY;  Service: Gastroenterology;  Laterality: N/A;   CORONARY ARTERY BYPASS GRAFT  1999   ESOPHAGOGASTRODUODENOSCOPY  05/05/2011   Procedure: ESOPHAGOGASTRODUODENOSCOPY (EGD);  Surgeon: Lear Ng, MD;  Location: [facility] ENDOSCOPY;  Service: Endoscopy;  Laterality: N/A;   ESOPHAGOGASTRODUODENOSCOPY N/A 05/13/2016   Procedure: ESOPHAGOGASTRODUODENOSCOPY (EGD);  Surgeon: [name], MD;  Location: [facility] ENDOSCOPY;  Service: Gastroenterology;  Laterality: N/A;   EUS N/A  12/11/2016   Procedure: FULL UPPER ENDOSCOPIC ULTRASOUND (EUS) RADIAL;  Surgeon: Jola Schmidt, MD;  Location: [facility] ENDOSCOPY;  Service: Endoscopy;  Laterality: N/A;   EYE SURGERY  08/09   Cataract Removal, bilateral   GIVENS CAPSULE STUDY N/A 11/17/2016   Procedure: GIVENS CAPSULE STUDY;  Surgeon: [name], MD;  Location: [facility] ENDOSCOPY;  Service: Gastroenterology;  Laterality: N/A;   HERNIA REPAIR     Inguinal Herniorhaphy   LEFT HEART CATHETERIZATION WITH CORONARY ANGIOGRAM N/A 08/24/2013   Procedure: LEFT HEART CATHETERIZATION WITH CORONARY ANGIOGRAM;  Surgeon: Troy Sine, MD;  Location: [facility] CATH LAB;  Service: Cardiovascular;  Laterality: N/A;   LEFT HEART CATHETERIZATION WITH CORONARY/GRAFT ANGIOGRAM Right 03/09/2011   Procedure: LEFT HEART CATHETERIZATION WITH Beatrix Fetters;  Surgeon: Lorretta Harp, MD;  Location: [facility] CATH LAB;  Service: Cardiovascular;  Laterality: Right;   PARTIAL HIP ARTHROPLASTY Left    SPINE SURGERY  10/09   C3-4, C4-5 diskectomy    Family History  Problem Relation Age of Onset   Heart attack Mother        Died at 37 with MI   Heart attack Father        Died at unknown age with MI   Heart disease Sister        CABG   Leukemia Sister    Heart attack Brother    Lung cancer Brother    Heart  attack Sister    Leukemia Sister    Stroke Sister     Social History   Tobacco Use   Smoking status: Never   Smokeless tobacco: Never  Vaping Use   Vaping Use: Never used  Substance Use Topics   Alcohol use: Yes    Comment: once a month, a few at a time   Drug use: No    Comment: in past, 30 years ago     Home Medications Prior to Admission medications   Medication Sig Start Date End Date Taking? Authorizing Provider  meclizine (ANTIVERT) 25 MG tablet Take 1 tablet (25 mg total) by mouth 3 (three) times daily as needed for dizziness. 12/21/20  Yes Truddie Hidden, MD  acetaminophen (TYLENOL) 500 MG tablet Take 1,000 mg by mouth every 6  (six) hours as needed.    [provider]  amLODipine (NORVASC) 10 MG tablet TAKE ONE TABLET BY MOUTH ONE TIME DAILY  11/22/13   Aldine Contes, MD  aspirin EC 81 MG tablet Take 81 mg by mouth daily.    [provider]  atorvastatin (LIPITOR) 80 MG tablet Take 80 mg by mouth daily at 6 PM.     [provider]  carvedilol (COREG) 12.5 MG tablet Take 1 tablet (12.5 mg total) by mouth 2 (two) times daily with a meal.    Adele Barthel D, MD  ferrous sulfate 325 (65 FE) MG tablet Take 325 mg by mouth daily with breakfast.    [provider]  iron dextran complex in sodium chloride 0.9 % 500 mL Inject into the vein once.    [provider]  isosorbide mononitrate (IMDUR) 30 MG 24 hr tablet Take 2 tablets (60 mg total) by mouth daily. 05/13/16   Bettey Costa, MD  lisinopril (PRINIVIL,ZESTRIL) 40 MG tablet Take one tablet by mouth one time daily 09/05/13   Blain Pais, MD  nitroGLYCERIN (NITROSTAT) 0.4 MG SL tablet Place 1 tablet (0.4 mg total) under the tongue every 5 (five) minutes. Up to 3 times as needed for chest pain Patient not taking: Reported on 03/19/2017 08/08/13   Dorothy Spark, MD  pantoprazole (PROTONIX) 40 MG tablet Take 1 tablet (40 mg total) by mouth daily. 05/13/16   Bettey Costa, MD  polyethylene glycol powder (GLYCOLAX/MIRALAX) powder Take 255 g by mouth daily. Patient not taking: Reported on 03/19/2017 11/12/16   Lin Landsman, MD  pravastatin (PRAVACHOL) 80 MG tablet Take 1 tablet (80 mg total) by mouth daily. 03/15/13   Blain Pais, MD  timolol (TIMOPTIC) 0.5 % ophthalmic solution Administer 1 drop to the right eye Two (2) times a day. 07/14/15   [provider]  traMADol Veatrice Bourbon) 50 MG tablet Take 1 to 2 tablets twice a day as needed for pain 03/19/15   Thurman Coyer, DO     Allergies    Patient has no known allergies.   Review of Systems   Review of Systems A comprehensive review of systems was  completed and negative except as noted in HPI.    Physical Exam BP (!) 157/88   Pulse 78   Temp 98.4 F (36.9 C)   Resp 16   SpO2 97%   Physical Exam Vitals and nursing note reviewed.  Constitutional:      Appearance: Normal appearance.  HENT:     Head: Normocephalic and atraumatic.     Nose: Nose normal.     Mouth/Throat:     Mouth: Mucous  membranes are moist.  Eyes:     Extraocular Movements: Extraocular movements intact.     Conjunctiva/sclera: Conjunctivae normal.  Cardiovascular:     Rate and Rhythm: Normal rate.  Pulmonary:     Effort: Pulmonary effort is normal.     Breath sounds: Normal breath sounds.  Abdominal:     General: Abdomen is flat.     Palpations: Abdomen is soft.     Tenderness: There is no abdominal tenderness.  Musculoskeletal:        General: No swelling. Normal range of motion.     Cervical back: Neck supple.  Skin:    General: Skin is warm and dry.  Neurological:     General: No focal deficit present.     Mental Status: He is alert and oriented to person, place, and time.     Cranial Nerves: No cranial nerve deficit.     Sensory: No sensory deficit.     Motor: No weakness.     Comments: Mild nystagmus with L lateral gaze  Psychiatric:        Mood and Affect: Mood normal.     ED Results / Procedures / Treatments   Labs (all labs ordered are listed, but only abnormal results are displayed) Labs Reviewed  BASIC METABOLIC PANEL - Abnormal; Notable for the following components:      Result Value   Glucose, Bld 101 (*)    Calcium 8.5 (*)    All other components within normal limits  CBC WITH DIFFERENTIAL/PLATELET - Abnormal; Notable for the following components:   HCT 38.9 (*)    All other components within normal limits  URINALYSIS, ROUTINE W REFLEX MICROSCOPIC    EKG EKG Interpretation  Date/Time:  Friday December 21 2020 03:45:18 EST Ventricular Rate:  72 PR Interval:  204 QRS Duration: 82 QT Interval:  398 QTC  Calculation: 435 R Axis:   14 Text Interpretation: Sinus rhythm with occasional Premature ventricular complexes Otherwise normal ECG No significant change since last tracing Confirmed by Calvert Cantor [phone]) on 12/21/2020 7:19:07 AM  Radiology CT Head Wo Contrast  Result Date: 12/21/2020 CLINICAL DATA:  75 year old male with dizziness. EXAM: CT HEAD WITHOUT CONTRAST TECHNIQUE: Contiguous axial images were obtained from the base of the skull through the vertex without intravenous contrast. COMPARISON:  Head CT 02/08/2004. FINDINGS: Brain: Age-appropriate generalized cerebral volume loss since 2006. No midline shift, mass effect, or evidence of intracranial mass lesion. No ventriculomegaly. Progressed Patchy and confluent bilateral cerebral white matter hypodensity. Mild new associated heterogeneity in the bilateral deep gray matter nuclei. No acute or chronic cortically based infarct identified. No acute intracranial hemorrhage identified. Vascular: Extensive Calcified atherosclerosis at the skull base. Intracranial artery tortuosity. No suspicious intracranial vascular hyperdensity. Skull: No acute osseous abnormality identified. Sinuses/Orbits: Visualized paranasal sinuses and mastoids are stable and well aerated. Tympanic cavities remain clear. Other: Interval postoperative changes to the globes. No acute orbit or scalp soft tissue finding. IMPRESSION: 1. No acute intracranial abnormality. 2. Progressed bilateral cerebral white matter disease since 2006, moderately advanced for age and most likely due to chronic small vessel disease. 3. Extensive intracranial atherosclerosis and arterial tortuosity. Electronically Signed   By: Genevie Ann M.D.   On: 12/21/2020 04:37   MR BRAIN WO CONTRAST  Result Date: 12/21/2020 CLINICAL DATA:  75 year old male with dizziness for two to three days. History of hypertension, MI. EXAM: MRI HEAD WITHOUT CONTRAST TECHNIQUE: Multiplanar, multiecho pulse sequences of the brain  and surrounding structures were obtained  without intravenous contrast. COMPARISON:  Head CT 0414 hours today. FINDINGS: Brain: No restricted diffusion to suggest acute infarction. No midline shift, mass effect, evidence of mass lesion, ventriculomegaly, extra-axial collection or acute intracranial hemorrhage. Cervicomedullary junction and pituitary are within normal limits. Patchy and confluent bilateral cerebral white matter T2 and FLAIR hyperintensity, widely scattered. No cortical encephalomalacia or chronic cerebral blood products identified. Deep gray matter nuclei, brainstem and cerebellum appear normal for age. Vascular: Major intracranial vascular flow voids are preserved with generalized intracranial artery tortuosity. Ectatic distal left ICA and ICA terminus. Skull and upper cervical spine: Partially visible cervical ACDF hardware artifact. Otherwise negative visible cervical spine. Visualized bone marrow signal is within normal limits. Sinuses/Orbits: Postoperative changes to both globes. Otherwise negative orbits. Scattered mild paranasal sinus mucosal thickening. No sinus fluid levels. Other: Mastoids are clear. Visible internal auditory structures appear normal. Negative visible scalp and face. IMPRESSION: 1. No acute intracranial abnormality. 2. Moderate for age cerebral white matter signal changes and generalized intracranial artery ectasia, tortuosity suggesting chronic hypertension. Electronically Signed   By: Genevie Ann M.D.   On: 12/21/2020 07:11   DG Chest Port 1 View  Result Date: 12/21/2020 CLINICAL DATA:  Shortness of breath, dizziness EXAM: PORTABLE CHEST 1 VIEW COMPARISON:  05/10/2016 FINDINGS: No focal consolidation. No pleural effusion or pneumothorax. Heart and mediastinal contours are unremarkable. Prior CABG. No acute osseous abnormality. IMPRESSION: No active disease. Electronically Signed   By: Kathreen Devoid M.D.   On: 12/21/2020 08:13    Procedures Procedures  Medications  Ordered in the ED Medications  meclizine (ANTIVERT) tablet 25 mg (25 mg Oral Given 12/21/20 0741)     MDM Rules/Calculators/A&P MDM Patient with nonspecific dizziness, likely vertigo but symptoms relatively mild. He does report some mild SOB but otherwise has been in his usual state of health. Labs and CT/MRI done in triage reviewed and negative. Will add CXR due to his SOB. Trial of meclizine to help with symptoms.   ED Course  I have reviewed the triage vital signs and the nursing notes.  Pertinent labs & imaging results that were available during my care of the patient were reviewed by me and considered in my medical decision making (see chart for details).  Clinical Course as of 12/21/20 3664  Fri Dec 21, 2020  0823 CXR is clear.  [CS]  [phone] Patient feeling better after Meclizine, will see how he does with ambulation. [CS]  [phone] Patient able to ambulate without difficulty. Will d/c with Rx for meclizine and PCP follow up.  [CS]    Clinical Course User Index [CS] Truddie Hidden, MD    Final Clinical Impression(s) / ED Diagnoses Final diagnoses:  Vertigo    Rx / DC Orders ED Discharge Orders          Ordered    meclizine (ANTIVERT) 25 MG tablet  3 times daily PRN        12/21/20 0909             Truddie Hidden, MD 12/21/20 [phone]

## 2020-12-21 NOTE — ED Notes (Signed)
Pt ambulated independently without difficulty. Gait steady and even.

## 2020-12-21 NOTE — ED Triage Notes (Signed)
Patient here with dizziness that started 8 hrs ago.  It is worse when he lays down.  No nausea or vomiting.  Patient with no gait disturbances.  No stroke symptoms per EMS.  No other complaints.

## 2020-12-21 NOTE — ED Provider Notes (Signed)
Emergency Medicine Provider Triage Evaluation Note  William [name]. , a 75 y.o. male  was evaluated in triage.  Pt complains of dizziness, onset 2-3 days ago, feels like he has to hold onto something when he walks, dizziness described as room spinning. No history of vertigo. Denies nausea, vomiting. EMS notes normal gait.  Denies recent falls or injuries, recent illness, unilateral weakness or numbness.  No other complaints or concerns.  Review of Systems  Positive: Dizziness Negative: Chest pain, shortness of breath, URI symptoms  Physical Exam  BP (!) 150/93 (BP Location: Left Arm)   Pulse 74   Temp 98.8 F (37.1 C) (Oral)   Resp 17   SpO2 96%  Gen:   Awake, no distress   Resp:  Normal effort  MSK:   Moves extremities without difficulty  Other:  Equal arm and leg strength.  Symmetric facial movement.  Sensation intact.  Medical Decision Making  Medically screening exam initiated at 3:42 AM.  Appropriate orders placed.  William [name]. was informed that the remainder of the evaluation will be completed by another provider, this initial triage assessment does not replace that evaluation, and the importance of remaining in the ED until their evaluation is complete.     Tacy Learn, PA-C 12/21/20 0356    Fatima Blank, MD 12/21/20 [phone]

## 2022-09-22 ENCOUNTER — Encounter: Payer: Self-pay | Admitting: Oncology

## 2022-10-07 ENCOUNTER — Encounter: Payer: Self-pay | Admitting: Oncology

## 2022-10-14 ENCOUNTER — Encounter: Payer: Self-pay | Admitting: Family Medicine

## 2022-10-14 ENCOUNTER — Ambulatory Visit (INDEPENDENT_AMBULATORY_CARE_PROVIDER_SITE_OTHER): Payer: 59 | Admitting: Family Medicine

## 2022-10-14 ENCOUNTER — Encounter: Payer: Self-pay | Admitting: Oncology

## 2022-10-14 VITALS — BP 118/68 | HR 67 | Ht 70.0 in | Wt 267.8 lb

## 2022-10-14 DIAGNOSIS — M1611 Unilateral primary osteoarthritis, right hip: Secondary | ICD-10-CM

## 2022-10-14 DIAGNOSIS — I25709 Atherosclerosis of coronary artery bypass graft(s), unspecified, with unspecified angina pectoris: Secondary | ICD-10-CM | POA: Diagnosis not present

## 2022-10-14 DIAGNOSIS — H9193 Unspecified hearing loss, bilateral: Secondary | ICD-10-CM | POA: Diagnosis not present

## 2022-10-14 NOTE — Assessment & Plan Note (Signed)
Patient complains of bilateral gradual hearing loss.  Has never had hearing evaluated.  He flushes his earwax regularly.  On exam his tympanic membrane's appeared grossly normal with no significant cerumen impaction. - Referral to audiology.  Pending audiology results will discuss hearing aid options with patient

## 2022-10-14 NOTE — Assessment & Plan Note (Signed)
Patient with history of CABG.  Follows up with his cardiologist regularly, but needs a new cardiology provider that is in network for his insurance.  Will send in new referral for cardiologist.  Takes Imdur, statin, carvedilol.  Patient was seeing Dr. Juliann Pares at [place] [facility]. - Continue current medications - Referral to cardiology.

## 2022-10-14 NOTE — Progress Notes (Unsigned)
New Patient Office Visit  Subjective    Patient ID: William Gracia., male    DOB: Oct 13, 1945  Age: 77 y.o. MRN: [id number]  CC:  Chief Complaint  Patient presents with   New Patient (Initial Visit)    HPI William [name]. presents to establish care Was going to [place].  Patient's previous healthcare provider no longer covered by his current insurance.  Patient has a history of coronary artery disease status post CABG.  Currently sees a cardiologist.  Will need a new cardiology referral for same reasons as above.  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  Patient complains of worsening hearing loss and tinnitus recently.  Has never had audiology evaluation.  Never worn  hearing aids.  Patient has struct of sleep apnea but states he has "outgrown it" and no longer wears his CPAP machine.  Patient gets hip injections on his right hip every 3 months by a orthopedic or sports medicine physician off [address]  He is unsure with the name of the provider.  He wants to know if we can do them here but we advised him we do not do those types of injections in the primary care office.  Patient has an eye doctor that he sees every 6 months at [facility].  PMH: CAD, MI, OSA, hypothyroid, PUD, HTN, HLD, R eye blindness  PSH: CABG, hernia, goiter, cataract, L hip replaced.    FH: cardiac - sisters, mother, father.    Tobacco use: no Alcohol use: no. Former.   Drug use: no.  Marital status: divorced - 1 son.  3 grandchildren.  Lives alone.   Employment: retired.    Outpatient Encounter Medications as of 10/14/2022  Medication Sig   acetaminophen (TYLENOL) 500 MG tablet Take 1,000 mg by mouth every 6 (six) hours as needed.   amLODipine (NORVASC) 10 MG tablet TAKE ONE TABLET BY MOUTH ONE TIME DAILY    aspirin EC 81 MG tablet Take 81 mg by mouth daily.   atorvastatin (LIPITOR) 80 MG tablet Take 80 mg by mouth daily at 6 PM.     carvedilol (COREG) 12.5 MG tablet Take 1 tablet (12.5 mg total) by mouth 2 (two) times daily with a meal.   ferrous sulfate 325 (65 FE) MG tablet Take 325 mg by mouth daily with breakfast.   iron dextran complex in sodium chloride 0.9 % 500 mL Inject into the vein once.   isosorbide mononitrate (IMDUR) 30 MG 24 hr tablet Take 2 tablets (60 mg total) by mouth daily.   lisinopril (PRINIVIL,ZESTRIL) 40 MG tablet Take one tablet by mouth one time daily   meclizine (ANTIVERT) 25 MG tablet Take 1 tablet (25 mg total) by mouth 3 (three) times daily as needed for dizziness.   nitroGLYCERIN (NITROSTAT) 0.4 MG SL tablet Place 1 tablet (0.4 mg total) under the tongue every 5 (five) minutes. Up to 3 times as needed for chest pain   pantoprazole (PROTONIX) 40 MG tablet Take 1 tablet (40 mg total) by mouth daily.   polyethylene glycol powder (GLYCOLAX/MIRALAX) powder Take 255 g by mouth daily.   pravastatin (PRAVACHOL) 80 MG tablet Take 1 tablet (80 mg total) by mouth daily.   timolol (TIMOPTIC) 0.5 % ophthalmic solution Administer 1 drop to the right eye Two (2) times a day.   traMADol (ULTRAM) 50 MG tablet Take 1 to 2 tablets twice a day as needed  for pain   No facility-administered encounter medications on file as of 10/14/2022.    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 1999   Hyperlipidemia    Hypertension    Iron deficiency anemia due to chronic blood loss 10/14/2016   Myocardial infarction [facility]) 1999   Peripheral neuropathy    Previous back surgery    discs placed in cervical spine   Rotator cuff syndrome    Shoulder pain, left    Sleep apnea, obstructive    uses BIPAP at night    Past Surgical History:  Procedure Laterality Date   COLONOSCOPY  05/05/2011   Procedure:  COLONOSCOPY;  Surgeon: Shirley Friar, MD;  Location: [facility] ENDOSCOPY;  Service: Endoscopy;  Laterality: N/A;   COLONOSCOPY WITH PROPOFOL N/A 05/13/2016   Procedure: COLONOSCOPY WITH PROPOFOL;  Surgeon: Wyline Mood, MD;  Location: [facility] ENDOSCOPY;  Service: Gastroenterology;  Laterality: N/A;   CORONARY ARTERY BYPASS GRAFT  1999   ESOPHAGOGASTRODUODENOSCOPY  05/05/2011   Procedure: ESOPHAGOGASTRODUODENOSCOPY (EGD);  Surgeon: Shirley Friar, MD;  Location: [facility] ENDOSCOPY;  Service: Endoscopy;  Laterality: N/A;   ESOPHAGOGASTRODUODENOSCOPY N/A 05/13/2016   Procedure: ESOPHAGOGASTRODUODENOSCOPY (EGD);  Surgeon: Wyline Mood, MD;  Location: [facility] ENDOSCOPY;  Service: Gastroenterology;  Laterality: N/A;   EUS N/A 12/11/2016   Procedure: FULL UPPER ENDOSCOPIC ULTRASOUND (EUS) RADIAL;  Surgeon: Rayann Heman, MD;  Location: [facility] ENDOSCOPY;  Service: Endoscopy;  Laterality: N/A;   EYE SURGERY  08/09   Cataract Removal, bilateral   GIVENS CAPSULE STUDY N/A 11/17/2016   Procedure: GIVENS CAPSULE STUDY;  Surgeon: Wyline Mood, MD;  Location: [facility] ENDOSCOPY;  Service: Gastroenterology;  Laterality: N/A;   HERNIA REPAIR     Inguinal Herniorhaphy   LEFT HEART CATHETERIZATION WITH CORONARY ANGIOGRAM N/A 08/24/2013   Procedure: LEFT HEART CATHETERIZATION WITH CORONARY ANGIOGRAM;  Surgeon: Lennette Bihari, MD;  Location: [facility] CATH LAB;  Service: Cardiovascular;  Laterality: N/A;   LEFT HEART CATHETERIZATION WITH CORONARY/GRAFT ANGIOGRAM Right 03/09/2011   Procedure: LEFT HEART CATHETERIZATION WITH Isabel Caprice;  Surgeon: Runell Gess, MD;  Location: [facility] CATH LAB;  Service: Cardiovascular;  Laterality: Right;   PARTIAL HIP ARTHROPLASTY Left    SPINE SURGERY  10/09   C3-4, C4-5 diskectomy    Family History  Problem Relation Age of Onset   Heart attack Mother        Died at 53 with MI   Heart attack Father        Died at unknown age with MI   Heart disease Sister        CABG   Leukemia Sister     Heart attack Brother    Lung cancer Brother    Heart attack Sister    Leukemia Sister    Stroke Sister     Social History   Socioeconomic History   Marital status: Married    Spouse name: Not on file   Number of children: Not on file   Years of education: Not on file   Highest education level: Not on file  Occupational History   Not on file  Tobacco Use   Smoking status: Never   Smokeless tobacco: Never  Vaping Use   Vaping status: Never Used  Substance and Sexual Activity   Alcohol use: Yes    Comment: once a month, a few at a time   Drug use: No    Comment: in past, 30 years ago   Sexual activity: Yes  Other Topics Concern   Not on file  Social History Narrative   Not on file   Social Determinants of Health   Financial Resource Strain: Low Risk  (05/07/2022)   Received from [facility]   Overall Financial Resource Strain (CARDIA)    Difficulty of Paying Living Expenses: Not hard at all  Food Insecurity: No Food Insecurity (05/07/2022)   Received from [facility]   Hunger Vital Sign    Worried About Running Out of Food in the Last Year: Never true    Ran Out of Food in the Last Year: Never true  Transportation Needs: No Transportation Needs (05/07/2022)   Received from [facility] - Transportation    In the past 12 months, has lack of transportation kept you from medical appointments or from getting medications?: No    Lack of Transportation (Non-Medical): No  Physical Activity: Not on file  Stress: Not on file  Social Connections: Not on file  Intimate Partner Violence: Not on file    ROS      Objective    BP 118/68   Pulse 67   Ht 5\' 10"  (1.778 m)   Wt 267 lb 12.8 oz (121.5 kg)   SpO2 97%   BMI 38.43 kg/m   Physical Exam General: Alert, oriented HEENT: Hard of hearing, mild right-sided esotropia.  Moist oral mucosa. CV: Regular rate and rhythm, no murmurs Pulmonary: Clear  bilaterally no wheeze or crackles GI: Soft, normal bowel sounds Extremities: No pedal edema Skin: Warm and dry no rashes Psych: Pleasant affect.     Assessment & Plan:   Bilateral hearing loss, unspecified hearing loss type Assessment & Plan: Patient complains of bilateral gradual hearing loss.  Has never had hearing evaluated.  He flushes his earwax regularly.  On exam his tympanic membrane's appeared grossly normal with no significant cerumen impaction. - Referral to audiology.  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  Follows up with his cardiologist regularly, but needs a new cardiology provider that is in network for his insurance.  Will send in new referral for cardiologist.  Takes Imdur, statin, carvedilol.  Patient was seeing Dr. Juliann Pares at [place] [facility]. - Continue current medications - Referral to cardiology.  Orders: -     Ambulatory referral to Cardiology  Primary osteoarthritis of right hip Assessment & Plan: Patient has had left hip replacement.  Currently has right hip osteoarthritis pain.  Sees a orthopedist.  Does not want any additional MRIs or surgery.  Would prefer quarterly hip injections.  Advised him he may need to continue to going to his orthopedist for this.  Uncertain who his orthopedist is but [facility] and [facility] are the facilities closest to where he is describing.  Because [name] should be already be on epic, we can call [facility] and see if he is a patient there and request records if so.  Return in about 4 weeks (around 11/11/2022) for hld, HTN.   Sandre Kitty, MD

## 2022-10-14 NOTE — Patient Instructions (Addendum)
It was nice to see you today,  We addressed the following topics today: -I will send in referral to the audiologist and the cardiologist. - If you do not hear from them in the next 2 weeks let us know and we will give you their information - I would like to follow-up with you in 1 month so we can discuss your hearing loss and other chronic issues - For your hip injections you will need to continue going to your orthopedist.  Have a great day,  Frederic Jericho, MD

## 2022-10-14 NOTE — Assessment & Plan Note (Signed)
Patient has had left hip replacement.  Currently has right hip osteoarthritis pain.  Sees a orthopedist.  Does not want any additional MRIs or surgery.  Would prefer quarterly hip injections.  Advised him he may need to continue to going to his orthopedist for this.  Uncertain who his orthopedist is but [facility] and [facility] are the facilities closest to where he is describing.  Because [name] should be already be on epic, we can call [facility] and see if he is a patient there and request records if so.

## 2022-10-15 ENCOUNTER — Encounter: Payer: Self-pay | Admitting: Internal Medicine

## 2022-10-15 ENCOUNTER — Ambulatory Visit: Payer: 59

## 2022-10-15 ENCOUNTER — Ambulatory Visit: Payer: 59 | Attending: Internal Medicine | Admitting: Internal Medicine

## 2022-10-15 VITALS — BP 118/70 | HR 74 | Ht 70.0 in | Wt 267.6 lb

## 2022-10-15 DIAGNOSIS — I10 Essential (primary) hypertension: Secondary | ICD-10-CM

## 2022-10-15 DIAGNOSIS — I25700 Atherosclerosis of coronary artery bypass graft(s), unspecified, with unstable angina pectoris: Secondary | ICD-10-CM | POA: Diagnosis not present

## 2022-10-15 DIAGNOSIS — I493 Ventricular premature depolarization: Secondary | ICD-10-CM

## 2022-10-15 DIAGNOSIS — E785 Hyperlipidemia, unspecified: Secondary | ICD-10-CM

## 2022-10-15 DIAGNOSIS — G4733 Obstructive sleep apnea (adult) (pediatric): Secondary | ICD-10-CM | POA: Diagnosis not present

## 2022-10-15 DIAGNOSIS — I2510 Atherosclerotic heart disease of native coronary artery without angina pectoris: Secondary | ICD-10-CM

## 2022-10-15 NOTE — Patient Instructions (Signed)
Medication Instructions:  Your physician recommends that you continue on your current medications as directed. Please refer to the Current Medication list given to you today.  *If you need a refill on your cardiac medications before your next appointment, please call your pharmacy*   Lab Work: NONE If you have labs (blood work) drawn today and your tests are completely normal, you will receive your results only by: MyChart Message (if you have MyChart) OR A paper copy in the mail If you have any lab test that is abnormal or we need to change your treatment, we will call you to review the results.   Testing/Procedures: Your physician has requested that you wear a heart monitor.    Follow-Up: At [facility], you and your health needs are our priority.  As part of our continuing mission to provide you with exceptional heart care, we have created designated Provider Care Teams.  These Care Teams include your primary Cardiologist (physician) and Advanced Practice Providers (APPs -  Physician Assistants and Nurse Practitioners) who all work together to provide you with the care you need, when you need it.  We recommend signing up for the patient portal called "MyChart".  Sign up information is provided on this After Visit Summary.  MyChart is used to connect with patients for Virtual Visits (Telemedicine).  Patients are able to view lab/test results, encounter notes, upcoming appointments, etc.  Non-urgent messages can be sent to your provider as well.   To learn more about what you can do with MyChart, go to [email or web address].    Your next appointment:   6 month(s)  Provider:   Christell Constant, MD     Other Instructions William [name]- Long Term Monitor Instructions  Your physician has requested you wear a ZIO patch monitor for 7 days.  This is a single patch monitor. [employer] supplies one patch monitor per enrollment. Additional stickers are not available. Please do not  apply patch if you will be having a Nuclear Stress Test,   Cardiac CT, MRI, or Chest Xray during the period you would be wearing the  monitor. The patch cannot be worn during these tests. You cannot remove and re-apply the  ZIO XT patch monitor.  Your ZIO patch monitor will be mailed 3 day USPS to your address on file. It may take 3-5 days  to receive your monitor after you have been enrolled.  Once you have received your monitor, please review the enclosed instructions. Your monitor  has already been registered assigning a specific monitor serial # to you.  Billing and Patient Assistance Program Information  We have supplied [employer] with any of your insurance information on file for billing purposes. [employer] offers a sliding scale Patient Assistance Program for patients that do not have  insurance, or whose insurance does not completely cover the cost of the ZIO monitor.  You must apply for the Patient Assistance Program to qualify for this discounted rate.  To apply, please call [employer] at [phone], select option 4, select option 2, ask to apply for  Patient Assistance Program. [name] will ask your household income, and how many people  are in your household. They will quote your out-of-pocket cost based on that information.  [employer] will also be able to set up a 4-month, interest-free payment plan if needed.  Applying the monitor   Shave hair from upper left chest.  Hold abrader disc by orange tab. Rub abrader in 40 strokes over the upper left chest as  indicated in your monitor instructions.  Clean area with 4 enclosed alcohol pads. Let dry.  Apply patch as indicated in monitor instructions. Patch will be placed under collarbone on left  side of chest with arrow pointing upward.  Rub patch adhesive wings for 2 minutes. Remove white label marked "1". Remove the white  label marked "2". Rub patch adhesive wings for 2 additional minutes.  While looking in a mirror, press and  release button in center of patch. A small green light will  flash 3-4 times. This will be your only indicator that the monitor has been turned on.  Do not shower for the first 24 hours. You may shower after the first 24 hours.  Press the button if you feel a symptom. You will hear a small click. Record Date, Time and  Symptom in the Patient Logbook.  When you are ready to remove the patch, follow instructions on the last 2 pages of Patient  Logbook. Stick patch monitor onto the last page of Patient Logbook.  Place Patient Logbook in the blue and white box. Use locking tab on box and tape box closed  securely. The blue and white box has prepaid postage on it. Please place it in the mailbox as  soon as possible. Your physician should have your test results approximately 7 days after the  monitor has been mailed back to [facility].  Call [facility] Customer Care at [phone] if you have questions regarding  your ZIO XT patch monitor. Call them immediately if you see an orange light blinking on your  monitor.  If your monitor falls off in less than 4 days, contact our Monitor department at [phone].  If your monitor becomes loose or falls off after 4 days call [employer] at [phone] for  suggestions on securing your monitor

## 2022-10-15 NOTE — Progress Notes (Signed)
Cardiology Office Note:  .    Date:  10/15/2022  ID:  [name], DOB 05-Jan-1946, MRN [id number] PCP: Sandre Kitty, MD  [facility] Providers Cardiologist:  Christell Constant, MD     CC: CAD transition for insurance reasons. Consulted for the evaluation of transition to new cardiologist at the behest of Dr. Constance Goltz   History of Present Illness: .    [facility]. is a 77 y.o. 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  Discussed the use of AI scribe software for clinical note transcription with the patient, who gave verbal consent to proceed.  History of Present Illness         Mr. Garcialopez, a 77 year old patient with a history of coronary artery disease, hyperlipidemia, hypertension, and premature ventricular contractions (PVCs), presents for a routine follow-up visit. He underwent a coronary artery bypass graft (CABG) in 1999 for his coronary artery disease. He has been asymptomatic since his last visit and his blood pressure has been well controlled. He also has obstructive sleep apnea, which was previously controlled on CPAP, but he has outgrown his current machine and is no longer using it. He has been experiencing rare PVCs that are isolated and asymptomatic. His ejection fraction (EF) was last assessed in 2018 and was reported to have mild right-sided inferolateral hypokinesis but was otherwise preserved. He has had issues with his insurance and has transitioned to a new cardiologist. He is currently on aspirin 81mg , carvedilol 12.5mg  twice daily, lisinopril 40mg  daily, Imdur 60mg  daily, and pravastatin 80mg  for cholesterol. He also has nitroglycerin as needed.  Patient notes that he is doing well.  Since last visit notes that he has hip problems- he  has already had left hip surgery and does not want right hip intervention.  Had no symptoms with PVCs.  Walk 30-40 minutes a day and is active. No chest pain or pressure .  No SOB/DOE and no  PND/Orthopnea.  No weight gain or leg swelling.  No palpitations or syncope .   Relevant histories: .  Social- former [employer]	[place] Patient [name] ROS: As per HPI.   Studies Reviewed: .   Cardiac Studies & Procedures     STRESS TESTS  NM MYOCAR MULTI W/SPECT W 05/12/2016  Narrative  Defect 1: There is a small defect of moderate severity present in the basal inferolateral and mid inferolateral location.  Findings consistent with ischemia.  This is an intermediate risk study.  The left ventricular ejection fraction is normal (55-65%).  There was no ST segment deviation noted during stress.  Small area of mild to mod inferolateral ischemia.   ECHOCARDIOGRAM  ECHOCARDIOGRAM COMPLETE 05/12/2016  Narrative *[facility]* [address] [place], [place] [address] [phone]  ------------------------------------------------------------------- Transthoracic Echocardiography  Patient:    Brendt, Dible MR #:       [id number] Study Date: 05/12/2016 Gender:     M Age:        70 Height:     177.8 cm Weight:     117.2 kg BSA:        2.45 m^2 Pt. Status: Room:       240A  ADMITTING    Lennie Odor REFERRING    Marguarite Arbour SONOGRAPHER  [facility] PERFORMING   [place] ATTENDING    [place], [place]  cc:  ------------------------------------------------------------------- LV EF: 60% -  65%  ------------------------------------------------------------------- Indications:      Chest pain 786.51.  ------------------------------------------------------------------- History:   PMH:   Dyspnea.  Risk factors:  Dyslipidemia.  ------------------------------------------------------------------- Study Conclusions  - Left ventricle: Systolic function was normal. The estimated ejection fraction was in the range of 60% to 65%. Hypokinesis of the inferolateral  myocardium.  ------------------------------------------------------------------- Study data:   Study status:  Routine.  Procedure:  Transthoracic echocardiography. Image quality was adequate. Transthoracic echocardiography.  M-mode, complete 2D, spectral Doppler, and color Doppler.  Birthdate:  Patient birthdate: 1945/08/17.  Age:  Patient is 77 yr old.  Sex:  Gender: male. BMI: 37.1 kg/m^2.  Patient status:  Inpatient.  Study date:  Study date: 05/12/2016. Study time: 07:41 PM.  Location:  Echo laboratory.  -------------------------------------------------------------------  ------------------------------------------------------------------- Left ventricle:  Systolic function was normal. The estimated ejection fraction was in the range of 60% to 65%.  Regional wall motion abnormalities:   Hypokinesis of the inferolateral myocardium.  ------------------------------------------------------------------- Aortic valve:   Trileaflet.  Doppler:   There was no stenosis. There was no significant regurgitation.  ------------------------------------------------------------------- Mitral valve:   Doppler:  There was trivial regurgitation.    Valve area by pressure half-time: 4.4 cm^2. Indexed valve area by pressure half-time: 1.79 cm^2/m^2.    Peak gradient (D): 2 mm Hg.  ------------------------------------------------------------------- Left atrium:  The atrium was at the upper limits of normal in size.  ------------------------------------------------------------------- Right ventricle:  The cavity size was normal.  ------------------------------------------------------------------- Tricuspid valve:   Doppler:  There was mild regurgitation.  ------------------------------------------------------------------- Right atrium:  The atrium was at the upper limits of normal in size.  ------------------------------------------------------------------- Measurements  Left ventricle                          Value          Reference LV ID, ED, PLAX chordal        (L)     40.6  mm       43 - 52 LV ID, ES, PLAX chordal                24.2  mm       23 - 38 LV fx shortening, PLAX chordal         40    %        >=29 LV PW thickness, ED                    12    mm       ---------- IVS/LV PW ratio, ED                    1.2            <=1.3 LV e&', lateral                         9.79  cm/s     ---------- LV E/e&', lateral                       7.44           ---------- LV e&', medial                          6.31  cm/s     ---------- LV E/e&', medial  11.54          ---------- LV e&', average                         8.05  cm/s     ---------- LV E/e&', average                       9.04           ----------  Ventricular septum                     Value          Reference IVS thickness, ED                      14.4  mm       ----------  Aorta                                  Value          Reference Aortic root ID, ED                     36    mm       ----------  Left atrium                            Value          Reference LA ID, A-P, ES                         45    mm       ---------- LA ID/bsa, A-P                         1.84  cm/m^2   <=2.2 LA volume, ES, 1-p A4C                 50.9  ml       ---------- LA volume/bsa, ES, 1-p A4C             20.8  ml/m^2   ---------- LA volume, ES, 1-p A2C                 63.4  ml       ---------- LA volume/bsa, ES, 1-p A2C             25.9  ml/m^2   ----------  Mitral valve                           Value          Reference Mitral E-wave peak velocity            72.8  cm/s     ---------- Mitral A-wave peak velocity            64.3  cm/s     ---------- Mitral deceleration time               169   ms       150 - 230 Mitral pressure half-time              50    ms       ---------- Mitral  peak gradient, D                2     mm Hg    ---------- Mitral E/A ratio, peak                 1.1            ---------- Mitral  valve area, PHT, DP             4.4   cm^2     ---------- Mitral valve area/bsa, PHT, DP         1.79  cm^2/m^2 ----------  Right atrium                           Value          Reference RA ID, S-I, ES, A4C            (H)     64.2  mm       34 - 49 RA area, ES, A4C               (H)     23.9  cm^2     8.3 - 19.5 RA volume, ES, A/L                     69.8  ml       ---------- RA volume/bsa, ES, A/L                 28.5  ml/m^2   ----------  Systemic veins                         Value          Reference Estimated CVP                          3     mm Hg    ----------  Right ventricle                        Value          Reference TAPSE                                  16    mm       ---------- RV s&', lateral, S                      8.16  cm/s     ----------  Legend: (L)  and  (H)  mark values outside specified reference range.  ------------------------------------------------------------------- Prepared and Electronically Authenticated by  Harold Hedge, MD 2018-05-01T06:48:21              Risk Assessment/Calculations:    DASI Score 16.2, fMETS 4.73      Physical Exam:    VS:  BP 118/70   Pulse 74   Ht 5\' 10"  (1.778 m)   Wt 267 lb 9.6 oz (121.4 kg)   SpO2 95%   BMI 38.40 kg/m    Wt Readings from Last 3 Encounters:  10/15/22 267 lb 9.6 oz (121.4 kg)  10/14/22 267 lb 12.8 oz (121.5 kg)  03/19/17 264 lb 6.4 oz (119.9 kg)    Gen: no distress, morbid obesity   Neck:  No JVD Cardiac: No Rubs or Gallops, no murmur, RRR +2 radial pulses Respiratory: Clear to auscultation bilaterally, normal effort, normal  respiratory rate GI: Soft, nontender, non-distended  MS: No  edema;  moves all extremities Integument: Skin feels warm, sternotomy site clean Neuro:  At time of evaluation, alert and oriented to person/place/time/situation  Psych: Normal affect, patient feels well   ASSESSMENT AND PLAN: .    Coronary Artery Disease - History of CABG in 1999 with LIMA to LID,  RIMA to RCA, and SVG to ramus intermedius. - Asymptomatic with no recent chest pain or shortness of breath. Last EF assessment in 2018  showed mild right-sided inferolateral hypokinesis but otherwise preserved, and mild ischemia on stress at that time. -Continue current medications:Aspirin 81mg  daily, Carvedilol 12.5mg  BID, Lisinopril 40mg  daily, Imdur 60mg  daily (two 30mg  tablets), and Pravastatin 80mg  daily.  Premature Ventricular Contractions (PVCs) Rare, isolated, and asymptomatic PVCs noted on EKG. -Order 1-week non-live heart monitor to assess frequency of PVCs. If frequent PVCs are detected, consider additional testing (repeat echo to start)  Hypertension Well-controlled on current medication regimen. -Continue Lisinopril 40mg  daily and Carvedilol 12.5mg  BID.  Hyperlipidemia On high-dose statin therapy. -Continue Pravastatin 80mg  daily.  Obstructive Sleep Apnea Previously controlled on CPAP, but patient has outgrown current device and is not using it. - needs return to treatment  He is not planned for surgery per patient Has no symptoms and describes himself as very functional; would not stress test prior to surgery  Six months unless high risk findings or change in condition  Riley Lam, MD FASE [facility] Cardiologist [facility]  [address], [address] [place], [place] [address] [phone]  3:58 PM

## 2022-10-15 NOTE — Progress Notes (Unsigned)
z

## 2022-11-11 ENCOUNTER — Encounter: Payer: Self-pay | Admitting: Family Medicine

## 2022-11-11 ENCOUNTER — Ambulatory Visit (INDEPENDENT_AMBULATORY_CARE_PROVIDER_SITE_OTHER): Payer: 59 | Admitting: Family Medicine

## 2022-11-11 VITALS — BP 132/73 | HR 66 | Ht 70.0 in | Wt 271.0 lb

## 2022-11-11 DIAGNOSIS — M1611 Unilateral primary osteoarthritis, right hip: Secondary | ICD-10-CM

## 2022-11-11 DIAGNOSIS — H9193 Unspecified hearing loss, bilateral: Secondary | ICD-10-CM | POA: Diagnosis not present

## 2022-11-11 DIAGNOSIS — G4733 Obstructive sleep apnea (adult) (pediatric): Secondary | ICD-10-CM | POA: Diagnosis not present

## 2022-11-11 NOTE — Assessment & Plan Note (Signed)
Recommended increasing his dose of Tylenol advised him he can take the 500 dose 3 times a day.  Advised him max dose at any time would be 1000 mg, no more than every 8 hours.  Advised patient to use topicals and applied heat as needed.  Recommended against NSAIDs, muscle relaxants, opioids.

## 2022-11-11 NOTE — Patient Instructions (Addendum)
It was nice to see you today,  We addressed the following topics today: -For your hip pain you can take as much as 1000 mg of Tylenol every 8 hours. - I would also use topical heat and topical creams such as menthol or lidocaine cream as needed. - For your sleep apnea I would recommend asking your dentist if it would be possible to have a mouthguard made specifically for sleep apnea.  Have a great day,  Frederic Jericho, MD

## 2022-11-11 NOTE — Assessment & Plan Note (Signed)
Has not worn CPAP in over 5 years.  Declines evaluation for inspire device.  No interest in wearing CPAP in the future.  Discussed reasons to wear CPAP including symptom improvement and reducing risk of cardiovascular disease.  Advised patient to talk to his dentist regarding possibility of a mouthguard device.

## 2022-11-11 NOTE — Progress Notes (Signed)
   Established Patient Office Visit  Subjective   Patient ID: William Hampe., male    DOB: Jul 23, 1945  Age: 77 y.o. MRN: [id number]  Chief Complaint  Patient presents with   Medical Management of Chronic Issues    HPI Patient has an audiology appointment on November 13.  Patient has already seen his new cardiologist office.  He states he wore a Zio patch and returned at.  Has not heard yet back about results of this monitor.  Patient has not worn his sleep apnea machine in 8 years.  Did not tolerate it when he did wear it.  We discussed other options including inspire device and mouthguard.  Patient not interested in inspire device.  Wears dentures on the top and partial dentures on the bottom, uncertain if he would be able to use a mouthguard.  Advised patient to follow-up with his dentist whom he sees a few times a year.  Patient complains of right hip pain still.  He takes 500 mg Tylenol in the morning and then again at night.  He uses topical creams occasionally.  Discussed increasing dose of Tylenol.  Discussed reasons why other medications are not appropriate such as muscle relaxants, NSAIDs, opioids.  The ASCVD Risk score (Arnett DK, et al., 2019) failed to calculate for the following reasons:   Cannot find a previous HDL lab   Cannot find a previous total cholesterol lab  Health Maintenance Due  Topic Date Due   Medicare Annual Wellness (AWV)  Never done   Hepatitis C Screening  Never done   Zoster Vaccines- Shingrix (1 of 2) 12/10/1993   Colonoscopy  05/13/2021   COVID-19 Vaccine (4 - 2023-24 season) 09/14/2022      Objective:     BP 132/73   Pulse 66   Ht 5\' 10"  (1.778 m)   Wt 271 lb (122.9 kg)   SpO2 99%   BMI 38.88 kg/m    Physical Exam General: Alert, oriented CV: Regular rate and rhythm no murmurs Pulmonary: Lungs clear bilaterally no wheeze or crackles Psych: Pleasant affect   No results found for any visits on 11/11/22.      Assessment &  Plan:   Bilateral hearing loss, unspecified hearing loss type Assessment & Plan: Has appointment upcoming with audiology.  Will discuss hearing aid options after that if appropriate.   SLEEP APNEA, OBSTRUCTIVE Assessment & Plan: Has not worn CPAP in over 5 years.  Declines evaluation for inspire device.  No interest in wearing CPAP in the future.  Discussed reasons to wear CPAP including symptom improvement and reducing risk of cardiovascular disease.  Advised patient to talk to his dentist regarding possibility of a mouthguard device.   Primary osteoarthritis of right hip Assessment & Plan: Recommended increasing his dose of Tylenol advised him he can take the 500 dose 3 times a day.  Advised him max dose at any time would be 1000 mg, no more than every 8 hours.  Advised patient to use topicals and applied heat as needed.  Recommended against NSAIDs, muscle relaxants, opioids.      Return in about 2 months (around 01/26/2023) for Hearing loss.    Sandre Kitty, MD

## 2022-11-11 NOTE — Assessment & Plan Note (Signed)
Has appointment upcoming with audiology.  Will discuss hearing aid options after that if appropriate.

## 2022-11-26 ENCOUNTER — Ambulatory Visit: Payer: 59 | Admitting: Audiologist

## 2022-12-01 ENCOUNTER — Emergency Department (HOSPITAL_COMMUNITY)
Admission: EM | Admit: 2022-12-01 | Discharge: 2022-12-02 | Disposition: A | Discharge status: Home / Self Care | Payer: 59 | Attending: Emergency Medicine | Admitting: Emergency Medicine

## 2022-12-01 ENCOUNTER — Emergency Department (HOSPITAL_COMMUNITY): Payer: 59

## 2022-12-01 ENCOUNTER — Other Ambulatory Visit: Payer: Self-pay

## 2022-12-01 DIAGNOSIS — R0789 Other chest pain: Secondary | ICD-10-CM | POA: Diagnosis not present

## 2022-12-01 DIAGNOSIS — I2510 Atherosclerotic heart disease of native coronary artery without angina pectoris: Secondary | ICD-10-CM | POA: Diagnosis not present

## 2022-12-01 DIAGNOSIS — Z79899 Other long term (current) drug therapy: Secondary | ICD-10-CM | POA: Diagnosis not present

## 2022-12-01 DIAGNOSIS — Z951 Presence of aortocoronary bypass graft: Secondary | ICD-10-CM | POA: Diagnosis not present

## 2022-12-01 DIAGNOSIS — M25551 Pain in right hip: Secondary | ICD-10-CM | POA: Insufficient documentation

## 2022-12-01 DIAGNOSIS — M1611 Unilateral primary osteoarthritis, right hip: Secondary | ICD-10-CM | POA: Diagnosis not present

## 2022-12-01 DIAGNOSIS — M25559 Pain in unspecified hip: Secondary | ICD-10-CM | POA: Diagnosis not present

## 2022-12-01 DIAGNOSIS — I10 Essential (primary) hypertension: Secondary | ICD-10-CM | POA: Diagnosis not present

## 2022-12-01 DIAGNOSIS — R079 Chest pain, unspecified: Secondary | ICD-10-CM | POA: Insufficient documentation

## 2022-12-01 DIAGNOSIS — M51369 Other intervertebral disc degeneration, lumbar region without mention of lumbar back pain or lower extremity pain: Secondary | ICD-10-CM | POA: Diagnosis not present

## 2022-12-01 DIAGNOSIS — M549 Dorsalgia, unspecified: Secondary | ICD-10-CM | POA: Diagnosis not present

## 2022-12-01 DIAGNOSIS — M545 Low back pain, unspecified: Secondary | ICD-10-CM | POA: Diagnosis not present

## 2022-12-01 DIAGNOSIS — Z96642 Presence of left artificial hip joint: Secondary | ICD-10-CM | POA: Diagnosis not present

## 2022-12-01 DIAGNOSIS — M8580 Other specified disorders of bone density and structure, unspecified site: Secondary | ICD-10-CM | POA: Diagnosis not present

## 2022-12-01 LAB — BASIC METABOLIC PANEL
Anion gap: 9 (ref 5–15)
BUN: 12 mg/dL (ref 8–23)
CO2: 21 mmol/L — ABNORMAL LOW (ref 22–32)
Calcium: 9 mg/dL (ref 8.9–10.3)
Chloride: 105 mmol/L (ref 98–111)
Creatinine, Ser: 0.99 mg/dL (ref 0.61–1.24)
GFR, Estimated: >60 mL/min (ref >60)
Glucose, Bld: 104 mg/dL — ABNORMAL HIGH (ref 70–99)
Potassium: 4.1 mmol/L (ref 3.5–5.1)
Sodium: 135 mmol/L (ref 135–145)

## 2022-12-01 LAB — CBC
HCT: 39.9 % (ref 39.0–52.0)
Hemoglobin: 13.1 g/dL (ref 13.0–17.0)
MCH: 27.9 pg (ref 26.0–34.0)
MCHC: 32.8 g/dL (ref 30.0–36.0)
MCV: 84.9 fL (ref 80.0–100.0)
Platelets: 161 10*3/uL (ref 150–400)
RBC: 4.7 MIL/uL (ref 4.22–5.81)
RDW: 13.4 % (ref 11.5–15.5)
WBC: 4.3 10*3/uL (ref 4.0–10.5)
nRBC: 0 % (ref 0.0–0.2)

## 2022-12-01 LAB — TROPONIN I (HIGH SENSITIVITY): Troponin I (High Sensitivity): 6 ng/L (ref <18)

## 2022-12-01 MED ORDER — ACETAMINOPHEN 500 MG PO TABS
1000.0000 mg | ORAL_TABLET | Freq: Once | ORAL | Status: AC
  Administered 2022-12-01: 1000 mg via ORAL
  Filled 2022-12-01: qty 2

## 2022-12-01 NOTE — ED Provider Triage Note (Signed)
Emergency Medicine Provider Triage Evaluation Note  William [name]. , a 77 y.o. male  was evaluated in triage.  Pt complains of hip and back pain for months unrelieved by tylenol. Reports trouble walking  Review of Systems  Positive: Back pain, hip pain, trouble walking  Negative: Fevers, incontinence  Physical Exam  BP 117/85 (BP Location: Right Arm)   Pulse 70   Temp 97.7 F (36.5 C) (Oral)   Resp 18   Ht 5\' 10"  (1.778 m)   Wt 122.5 kg   SpO2 96%   BMI 38.74 kg/m  Gen:   Awake, no distress   Resp:  Normal effort  MSK:   Moves extremities without difficulty  Other:  Strength 5/5 in BLE  Medical Decision Making  Medically screening exam initiated at 4:29 PM.  Appropriate orders placed.  William [name]. was informed that the remainder of the evaluation will be completed by another provider, this initial triage assessment does not replace that evaluation, and the importance of remaining in the ED until their evaluation is complete.     William Grandchild, MD 12/01/22 1630

## 2022-12-01 NOTE — ED Triage Notes (Addendum)
Pt with L hip pain and back pain. Denies injury. States pain making him unable to walk. Adds after triage is complete that he has been having intermittent central chest pain and labored breathing x 2 days as well.

## 2022-12-02 ENCOUNTER — Emergency Department (HOSPITAL_COMMUNITY): Payer: 59

## 2022-12-02 ENCOUNTER — Encounter (HOSPITAL_COMMUNITY): Payer: Self-pay

## 2022-12-02 DIAGNOSIS — M25559 Pain in unspecified hip: Secondary | ICD-10-CM | POA: Diagnosis not present

## 2022-12-02 DIAGNOSIS — M549 Dorsalgia, unspecified: Secondary | ICD-10-CM | POA: Diagnosis not present

## 2022-12-02 DIAGNOSIS — M51369 Other intervertebral disc degeneration, lumbar region without mention of lumbar back pain or lower extremity pain: Secondary | ICD-10-CM | POA: Diagnosis not present

## 2022-12-02 LAB — URINALYSIS, ROUTINE W REFLEX MICROSCOPIC
Bilirubin Urine: NEGATIVE
Glucose, UA: NEGATIVE mg/dL
Hgb urine dipstick: NEGATIVE
Ketones, ur: NEGATIVE mg/dL
Leukocytes,Ua: NEGATIVE
Nitrite: NEGATIVE
Protein, ur: NEGATIVE mg/dL
Specific Gravity, Urine: 1.029 (ref 1.005–1.030)
pH: 5 (ref 5.0–8.0)

## 2022-12-02 LAB — TROPONIN I (HIGH SENSITIVITY): Troponin I (High Sensitivity): 6 ng/L (ref <18)

## 2022-12-02 MED ORDER — OXYCODONE HCL 5 MG PO TABS
5.0000 mg | ORAL_TABLET | Freq: Once | ORAL | Status: AC
  Administered 2022-12-02: 5 mg via ORAL
  Filled 2022-12-02: qty 1

## 2022-12-02 NOTE — Discharge Instructions (Signed)
You were evaluated in the Emergency Department and after careful evaluation, we did not find any emergent condition requiring admission or further testing in the hospital.  Your exam/testing today was overall reassuring.  Recommend follow-up with orthopedics to discuss your pain.  Also recommend follow-up with your cardiologist.  Please return to the Emergency Department if you experience any worsening of your condition.  Thank you for allowing [place] to be a part of your care.

## 2022-12-02 NOTE — ED Provider Notes (Signed)
[facility]EMER[facility]ENC[facility] DEPT [facility] Emergency Department Provider Note MRN:  [id number]  Arrival date & time: 12/02/22     Chief Complaint   Back Pain and Hip Pain   History of Present Illness   William Bolanos. is a 77 y.o. year-old male with a history of CAD, CABG presenting to the ED with chief complaint of back and hip pain.  Hip pain for the past several days, low back pain for the past 2 days.  Worse with moving.  No dysuria or hematuria, no fever, no numbness or weakness to the arms or legs, no bowel or bladder dysfunction.  Also intermittent chest pain with shortness of breath for the past 2 days.  Review of Systems  A thorough review of systems was obtained and all systems are negative except as noted in the HPI and PMH.   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 1999   Hyperlipidemia    Hypertension    Iron deficiency anemia due to chronic blood loss 10/14/2016   Myocardial infarction [facility]) 1999   Peripheral neuropathy    Previous back surgery    discs placed in cervical spine   Rotator cuff syndrome    Shoulder pain, left    Sleep apnea, obstructive    uses BIPAP at night    Past Surgical History:  Procedure Laterality Date   COLONOSCOPY  05/05/2011   Procedure: COLONOSCOPY;  Surgeon: Shirley Friar, MD;  Location: [facility] ENDOSCOPY;  Service: Endoscopy;  Laterality: N/A;   COLONOSCOPY WITH PROPOFOL N/A 05/13/2016   Procedure: COLONOSCOPY WITH PROPOFOL;  Surgeon: Wyline Mood, MD;  Location: [facility] ENDOSCOPY;  Service: Gastroenterology;  Laterality: N/A;   CORONARY ARTERY BYPASS GRAFT  1999   ESOPHAGOGASTRODUODENOSCOPY  05/05/2011   Procedure: ESOPHAGOGASTRODUODENOSCOPY (EGD);  Surgeon: Shirley Friar, MD;  Location:  [facility] ENDOSCOPY;  Service: Endoscopy;  Laterality: N/A;   ESOPHAGOGASTRODUODENOSCOPY N/A 05/13/2016   Procedure: ESOPHAGOGASTRODUODENOSCOPY (EGD);  Surgeon: Wyline Mood, MD;  Location: [facility] ENDOSCOPY;  Service: Gastroenterology;  Laterality: N/A;   EUS N/A 12/11/2016   Procedure: FULL UPPER ENDOSCOPIC ULTRASOUND (EUS) RADIAL;  Surgeon: Rayann Heman, MD;  Location: [facility] ENDOSCOPY;  Service: Endoscopy;  Laterality: N/A;   EYE SURGERY  08/09   Cataract Removal, bilateral   GIVENS CAPSULE STUDY N/A 11/17/2016   Procedure: GIVENS CAPSULE STUDY;  Surgeon: Wyline Mood, MD;  Location: [facility] ENDOSCOPY;  Service: Gastroenterology;  Laterality: N/A;   HERNIA REPAIR     Inguinal Herniorhaphy   LEFT HEART CATHETERIZATION WITH CORONARY ANGIOGRAM N/A 08/24/2013   Procedure: LEFT HEART CATHETERIZATION WITH CORONARY ANGIOGRAM;  Surgeon: Lennette Bihari, MD;  Location: [facility] CATH LAB;  Service: Cardiovascular;  Laterality: N/A;   LEFT HEART CATHETERIZATION WITH CORONARY/GRAFT ANGIOGRAM Right 03/09/2011   Procedure: LEFT HEART CATHETERIZATION WITH Isabel Caprice;  Surgeon: Runell Gess, MD;  Location: [facility] CATH LAB;  Service: Cardiovascular;  Laterality: Right;   PARTIAL HIP ARTHROPLASTY Left    SPINE SURGERY  10/09   C3-4, C4-5 diskectomy    Family History  Problem Relation Age of Onset   Heart attack Mother  Died at 65 with MI   Heart attack Father        Died at unknown age with MI   Heart disease Sister        CABG   Leukemia Sister    Heart attack Brother    Lung cancer Brother    Heart attack Sister    Leukemia Sister    Stroke Sister     Social History   Socioeconomic History   Marital status: Married    Spouse name: Not on file   Number of children: Not on file   Years of education: Not on file   Highest education level: Not on file  Occupational History   Not on file  Tobacco Use   Smoking status: Never   Smokeless tobacco: Never  Vaping Use   Vaping status: Never Used   Substance and Sexual Activity   Alcohol use: Yes    Comment: once a month, a few at a time   Drug use: No    Comment: in past, 30 years ago   Sexual activity: Yes  Other Topics Concern   Not on file  Social History Narrative   Not on file   Social Determinants of Health   Financial Resource Strain: Low Risk  (05/07/2022)   Received from [facility]   Overall Financial Resource Strain (CARDIA)    Difficulty of Paying Living Expenses: Not hard at all  Food Insecurity: No Food Insecurity (05/07/2022)   Received from [facility]   Hunger Vital Sign    Worried About Running Out of Food in the Last Year: Never true    Ran Out of Food in the Last Year: Never true  Transportation Needs: No Transportation Needs (05/07/2022)   Received from [facility] - Transportation    In the past 12 months, has lack of transportation kept you from medical appointments or from getting medications?: No    Lack of Transportation (Non-Medical): No  Physical Activity: Not on file  Stress: Not on file  Social Connections: Not on file  Intimate Partner Violence: Not on file     Physical Exam   Vitals:   12/01/22 1617 12/01/22 2339  BP: 117/85 123/69  Pulse: 70 66  Resp: 18 18  Temp: 97.7 F (36.5 C) 98.2 F (36.8 C)  SpO2: 96% 95%    CONSTITUTIONAL: Well-appearing, NAD NEURO/PSYCH:  Alert and oriented x 3, no focal deficits EYES:  eyes equal and reactive ENT/NECK:  no LAD, no JVD CARDIO: Regular rate, well-perfused, normal S1 and S2 PULM:  CTAB no wheezing or rhonchi GI/GU:  non-distended, non-tender MSK/SPINE:  No gross deformities, no edema SKIN:  no rash, atraumatic   *Additional and/or pertinent findings included in MDM below  Diagnostic and Interventional Summary    EKG Interpretation Date/Time:  Monday December 01 2022 16:42:31 EST Ventricular Rate:  63 PR Interval:  212 QRS Duration:  92 QT Interval:  408 QTC  Calculation: 417 R Axis:   7  Text Interpretation: Sinus rhythm with 1st degree A-V block Cannot rule out Inferior infarct , age undetermined Abnormal ECG When compared with ECG of 15-Oct-2022 15:31, PREVIOUS ECG IS PRESENT Confirmed by Kennis Carina [phone]) on 12/02/2022 1:23:50 AM       Labs Reviewed  BASIC METABOLIC PANEL - Abnormal; Notable for the following components:      Result Value   CO2 21 (*)    Glucose, Bld 104 (*)  All other components within normal limits  CBC  URINALYSIS, ROUTINE W REFLEX MICROSCOPIC  TROPONIN I (HIGH SENSITIVITY)  TROPONIN I (HIGH SENSITIVITY)    DG Lumbar Spine Complete  Final Result    DG Hip Unilat With Pelvis 2-3 Views Right  Final Result    DG Chest 2 View  Final Result      Medications  acetaminophen (TYLENOL) tablet 1,000 mg (1,000 mg Oral Given 12/01/22 1636)  oxyCODONE (Oxy IR/ROXICODONE) immediate release tablet 5 mg (5 mg Oral Given 12/02/22 0136)     Procedures  /  Critical Care Procedures  ED Course and Medical Decision Making  Initial Impression and Ddx Favoring osteoarthritis as the cause of patient's hip pain.  Preserve range of motion but some pain is elicited with movement.  Not having any signs or symptoms of myelopathy with regard to the back pain.  Abdomen soft and nontender with no rebound guarding or rigidity.  Vital signs are normal.  Patient does have a history of CAD and CABG and is having some intermittent chest pain, ACS is considered, will obtain troponin evaluation, chest x-ray.  Past medical/surgical history that increases complexity of ED encounter: CAD  Interpretation of Diagnostics I personally reviewed the EKG and my interpretation is as follows: Sinus rhythm without concerning ischemic features  Labs reassuring with no significant blood count or electrolyte disturbance.  First troponin negative.  Patient Reassessment and Ultimate Disposition/Management     Patient resting comfortably on my  reassessment, wakes easily, normal vitals, troponin negative x 2, x-rays showing arthritis but no fracture, appropriate for discharge.  Overall doubt cardiac etiology of patient's chest pain which is now been resolved for several hours.  Patient management required discussion with the following services or consulting groups:  None  Complexity of Problems Addressed Acute illness or injury that poses threat of life of bodily function  Additional Data Reviewed and Analyzed Further history obtained from: Prior labs/imaging results  Additional Factors Impacting ED Encounter Risk None  William Sow. Pilar Plate, MD [facility] Emergency Medicine [facility] [email or web address]  Final Clinical Impressions(s) / ED Diagnoses     ICD-10-CM   1. Acute midline low back pain without sciatica  M54.50     2. Pain of right hip  M25.551     3. Chest pain, unspecified type  R07.9       ED Discharge Orders     None        Discharge Instructions Discussed with and Provided to Patient:     Discharge Instructions      You were evaluated in the Emergency Department and after careful evaluation, we did not find any emergent condition requiring admission or further testing in the hospital.  Your exam/testing today was overall reassuring.  Recommend follow-up with orthopedics to discuss your pain.  Also recommend follow-up with your cardiologist.  Please return to the Emergency Department if you experience any worsening of your condition.  Thank you for allowing [place] to be a part of your care.        Sabas Sous, MD 12/02/22 [phone]

## 2023-01-12 ENCOUNTER — Encounter: Payer: Self-pay | Admitting: Family Medicine

## 2023-01-12 ENCOUNTER — Ambulatory Visit (INDEPENDENT_AMBULATORY_CARE_PROVIDER_SITE_OTHER): Payer: 59 | Admitting: Family Medicine

## 2023-01-12 VITALS — BP 155/92 | HR 62 | Ht 70.0 in | Wt 264.4 lb

## 2023-01-12 DIAGNOSIS — H9193 Unspecified hearing loss, bilateral: Secondary | ICD-10-CM

## 2023-01-12 DIAGNOSIS — E782 Mixed hyperlipidemia: Secondary | ICD-10-CM

## 2023-01-12 DIAGNOSIS — I10 Essential (primary) hypertension: Secondary | ICD-10-CM | POA: Diagnosis not present

## 2023-01-12 DIAGNOSIS — R7303 Prediabetes: Secondary | ICD-10-CM | POA: Diagnosis not present

## 2023-01-12 NOTE — Assessment & Plan Note (Signed)
Will recheck A1c today

## 2023-01-12 NOTE — Patient Instructions (Addendum)
It was nice to see you today,  We addressed the following topics today: -Continue taking your Tylenol as needed.  Do not take more than the dose you are currently taking without talking to a doctor first. - Continue taking your topical medications for joint pain.  Some examples of over-the-counter topical medications you can try are: Voltaren gel/diclofenac gel; lidocaine containing gels; menthol containing gel; capsaicin containing creams  -I will order some blood test for you today.  Will let you know the results when we get them - I would like you to call your insurance company and ask them if they will provide a free blood pressure cuff for you at home.  Sometimes Medicare plans with this for their customers. - The number for the audiologist is: [phone]  Have a great day,  William Jericho, MD

## 2023-01-12 NOTE — Assessment & Plan Note (Signed)
Tympanic membrane's visualized.  Patient missed his audiology appointment because of severe hip pain.  Advised patient to call to reschedule and provided number for him.

## 2023-01-12 NOTE — Assessment & Plan Note (Signed)
Blood pressure was slightly elevated today.  He had not taken his medication.  We discussed checking blood pressure at home.  He does not have a home blood pressure cuff.  Advised him to call his insurance company to see if they will provide him 1.

## 2023-01-12 NOTE — Assessment & Plan Note (Signed)
Will recheck cholesterol level today.  Has not been checked in about 9 months.  Patient says he eats fish and avoids pork.

## 2023-01-12 NOTE — Progress Notes (Signed)
   Established Patient Office Visit  Subjective   Patient ID: William [name]., male    DOB: [birth year]  Age: 77 y.o. MRN: [id number]  Chief Complaint  Patient presents with   Medical Management of Chronic Issues    HPI  Hearing loss-patient did not go to his audiology appointment due to severe hip pain.  Has not rescheduled yet.  Discussed rescheduling this to assess if he needs hearing aids.  Patient states his cardiologist office called him and advised him he has a follow-up appointment.  Advised patient to call their office to schedule the follow-up.  Hypertension-patient did not take his blood pressure medication today.  He does not have a blood pressure cuff at home.  Advised him to call his insurance company to see if they will provide one for him.  Hip pain-patient goes to [facility] orthopedics.  Recently he increased his Tylenol use to 2 extra strength Tylenol twice a day.  Also puts Tylenol arthritis gel on his hip.  Has not had a hip injection in the past 6 months.   The ASCVD Risk score (Arnett DK, et al., 2019) failed to calculate for the following reasons:   Cannot find a previous HDL lab   Cannot find a previous total cholesterol lab  Health Maintenance Due  Topic Date Due   Medicare Annual Wellness (AWV)  Never done   Hepatitis C Screening  Never done   Zoster Vaccines- Shingrix (1 of 2) 12/10/1993   Colonoscopy  05/13/2021   COVID-19 Vaccine (4 - 2024-25 season) 09/14/2022      Objective:     BP (!) 155/92   Pulse 62   Ht 5\' 10"  (1.778 m)   Wt 264 lb 6.4 oz (119.9 kg)   SpO2 98%   BMI 37.94 kg/m    Physical Exam General: Alert, oriented HEENT: Mild cerumen bilaterally.  Tympanic membranes visualized. CV: Regular in rhythm Pulmonary: Scar bilaterally    No results found for any visits on 01/12/23.      Assessment & Plan:   Essential hypertension Assessment & Plan: Blood pressure was slightly elevated today.  He had not taken his  medication.  We discussed checking blood pressure at home.  He does not have a home blood pressure cuff.  Advised him to call his insurance company to see if they will provide him 1.   Prediabetes Assessment & Plan: Will recheck A1c today.  Orders: -     Hemoglobin A1c  HYPERLIPIDEMIA TYPE IIB / III Assessment & Plan: Will recheck cholesterol level today.  Has not been checked in about 9 months.  Patient says he eats fish and avoids pork.  Orders: -     Lipid panel  Bilateral hearing loss, unspecified hearing loss type Assessment & Plan: Tympanic membrane's visualized.  Patient missed his audiology appointment because of severe hip pain.  Advised patient to call to reschedule and provided number for him.      Return in about 2 months (around 03/13/2023).    Sandre Kitty, MD

## 2023-01-13 LAB — LIPID PANEL
Chol/HDL Ratio: 3.6 {ratio} (ref 0.0–5.0)
Cholesterol, Total: 150 mg/dL (ref 100–199)
HDL: 42 mg/dL (ref >39)
LDL Chol Calc (NIH): 90 mg/dL (ref 0–99)
Triglycerides: 99 mg/dL (ref 0–149)
VLDL Cholesterol Cal: 18 mg/dL (ref 5–40)

## 2023-01-13 LAB — HEMOGLOBIN A1C
Est. average glucose Bld gHb Est-mCnc: 120 mg/dL
Hgb A1c MFr Bld: 5.8 % — ABNORMAL HIGH (ref 4.8–5.6)

## 2023-01-20 ENCOUNTER — Other Ambulatory Visit: Payer: Self-pay | Admitting: Family Medicine

## 2023-01-20 MED ORDER — EZETIMIBE 10 MG PO TABS: 10.0000 mg | ORAL_TABLET | Freq: Every day | ORAL | 1 refills | Status: DC

## 2023-02-12 ENCOUNTER — Ambulatory Visit: Payer: 59 | Admitting: Audiologist

## 2023-03-13 ENCOUNTER — Ambulatory Visit: Payer: 59 | Admitting: Family Medicine

## 2023-04-06 ENCOUNTER — Encounter: Payer: Self-pay | Admitting: Oncology

## 2023-04-08 ENCOUNTER — Encounter: Payer: Self-pay | Admitting: Oncology

## 2023-04-15 ENCOUNTER — Ambulatory Visit (INDEPENDENT_AMBULATORY_CARE_PROVIDER_SITE_OTHER): Payer: 59 | Admitting: Family Medicine

## 2023-04-15 ENCOUNTER — Encounter: Payer: Self-pay | Admitting: Family Medicine

## 2023-04-15 VITALS — BP 123/62 | HR 64 | Ht 70.0 in | Wt 267.1 lb

## 2023-04-15 DIAGNOSIS — I25709 Atherosclerosis of coronary artery bypass graft(s), unspecified, with unspecified angina pectoris: Secondary | ICD-10-CM

## 2023-04-15 DIAGNOSIS — M1611 Unilateral primary osteoarthritis, right hip: Secondary | ICD-10-CM | POA: Diagnosis not present

## 2023-04-15 DIAGNOSIS — G4733 Obstructive sleep apnea (adult) (pediatric): Secondary | ICD-10-CM | POA: Diagnosis not present

## 2023-04-15 DIAGNOSIS — I10 Essential (primary) hypertension: Secondary | ICD-10-CM | POA: Diagnosis not present

## 2023-04-15 MED ORDER — EZETIMIBE 10 MG PO TABS: 10.0000 mg | ORAL_TABLET | Freq: Every day | ORAL | 3 refills | Status: DC

## 2023-04-15 NOTE — Assessment & Plan Note (Signed)
 Blood pressure at goal.  Continue lisinopril and amlodipine.

## 2023-04-15 NOTE — Progress Notes (Signed)
   Established Patient Office Visit  Subjective   Patient ID: William [name]., male    DOB: [birth year]  Age: 78 y.o. MRN: [id number]  Chief Complaint  Patient presents with   Medical Management of Chronic Issues    HPI Patient has not had any changes to his medication since the last time I talked to him.  The right is running out of his Zetia.  He would like me to send in the refill to his mail-in pharmacy.  Hip pain-patient continues to use Tylenol and topical medication.  Also started using a back brace and this has helped with his pain as well.  Patient has not gone to the audiologist be because of the cost associated with going.  Hypertension-patient is taking his lisinopril and amlodipine.  No questions or concerns regarding this medication.  Patient has a dental appointment next week.  I recommended asking them about a dental appliance to help with sleep apnea since he does not wear his CPAP.   The ASCVD Risk score (Arnett DK, et al., 2019) failed to calculate for the following reasons:   Risk score cannot be calculated because patient has a medical history suggesting prior/existing ASCVD  Health Maintenance Due  Topic Date Due   Medicare Annual Wellness (AWV)  Never done   Hepatitis C Screening  Never done   Zoster Vaccines- Shingrix (1 of 2) 12/10/1993   Colonoscopy  05/13/2021   COVID-19 Vaccine (4 - 2024-25 season) 09/14/2022      Objective:     BP 123/62   Pulse 64   Ht 5\' 10"  (1.778 m)   Wt 267 lb 1.9 oz (121.2 kg)   SpO2 97%   BMI 38.33 kg/m    Physical Exam General: Alert, oriented CV: Regular rate rhythm Pulmonary: Lungs clear bilaterally   No results found for any visits on 04/15/23.      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  Pravastatin is on his med list but has not had this in many years.  Will remove it from  his medication list.  Rechecking lipid panel today.  Orders: -     Lipid panel  Primary osteoarthritis of right hip Assessment & Plan: Continue with conservative management: Tylenol and topical creams.  Also started wearing a back brace which has helped with symptoms.   Essential hypertension Assessment & Plan: Blood pressure at goal.  Continue lisinopril and amlodipine.   SLEEP APNEA, OBSTRUCTIVE Assessment & Plan: Does not tolerate CPAP, has not worn it in years.  Has dental appointment upcoming later this month.  Advised him to discuss the option of a nocturnal dental appliance to help with sleep apnea at his dental appointment.   Other orders -     Ezetimibe; Take 1 tablet (10 mg total) by mouth daily.  Dispense: 90 tablet; Refill: 3     Return in about 6 months (around 10/15/2023) for hld, HTN.    Sandre Kitty, MD

## 2023-04-15 NOTE — Patient Instructions (Signed)
 It was nice to see you today,  We addressed the following topics today: -We are going to check your cholesterol level today.  I will let you know the results when I get them - I have sent in refills of your Zetia to the online pharmacy that mailed you your medications. - You have an appointment next week with the cardiologist. - No changes to your medicines at this time.  Follow-up with me in 6 months.  Have a great day,  Frederic Jericho, MD

## 2023-04-15 NOTE — Assessment & Plan Note (Signed)
 Does not tolerate CPAP, has not worn it in years.  Has dental appointment upcoming later this month.  Advised him to discuss the option of a nocturnal dental appliance to help with sleep apnea at his dental appointment.

## 2023-04-15 NOTE — Assessment & Plan Note (Signed)
 Continue with conservative management: Tylenol and topical creams.  Also started wearing a back brace which has helped with symptoms.

## 2023-04-15 NOTE — Assessment & Plan Note (Signed)
 Continue atorvastatin and Zetia.  Pravastatin is on his med list but has not had this in many years.  Will remove it from his medication list.  Rechecking lipid panel today.

## 2023-04-16 ENCOUNTER — Encounter: Payer: Self-pay | Admitting: Family Medicine

## 2023-04-16 LAB — LIPID PANEL
Chol/HDL Ratio: 3 ratio (ref 0.0–5.0)
Cholesterol, Total: 118 mg/dL (ref 100–199)
HDL: 40 mg/dL (ref >39)
LDL Chol Calc (NIH): 62 mg/dL (ref 0–99)
Triglycerides: 78 mg/dL (ref 0–149)
VLDL Cholesterol Cal: 16 mg/dL (ref 5–40)

## 2023-04-24 ENCOUNTER — Ambulatory Visit: Payer: 59 | Attending: Cardiology | Admitting: Internal Medicine

## 2023-04-24 VITALS — BP 150/78 | HR 70 | Ht 70.0 in | Wt 270.0 lb

## 2023-04-24 DIAGNOSIS — I2510 Atherosclerotic heart disease of native coronary artery without angina pectoris: Secondary | ICD-10-CM

## 2023-04-24 DIAGNOSIS — D509 Iron deficiency anemia, unspecified: Secondary | ICD-10-CM | POA: Diagnosis not present

## 2023-04-24 DIAGNOSIS — I493 Ventricular premature depolarization: Secondary | ICD-10-CM | POA: Insufficient documentation

## 2023-04-24 DIAGNOSIS — I10 Essential (primary) hypertension: Secondary | ICD-10-CM | POA: Diagnosis not present

## 2023-04-24 NOTE — Progress Notes (Signed)
 Cardiology Office Note:  .    Date:  04/24/2023  ID:  [name], DOB 26-Oct-1945, MRN [id number] PCP: Sandre Kitty, MD  [facility] Providers Cardiologist:  Christell Constant, MD     CC: CAD  eval  History of Present Illness: .    William [name]. is a 78 y.o. 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  William [name]. is a 78 year old male with coronary artery disease and premature ventricular contractions who presents for follow-up of his cardiovascular conditions.  He has a history of coronary artery disease and underwent bypass surgery in 1999. Despite the surgery, he still carries the diagnosis of coronary artery disease. He is currently asymptomatic with no chest pain or shortness of breath. He monitors his blood pressure at home, which is usually well-controlled when he takes his medication as prescribed. He takes two pills for high blood pressure but did not take his medication today due to travel, as he usually takes it after breakfast, which he has not had today.  He is aware of his premature ventricular contractions (PVCs), which have reportedly improved. No 'funny heartbeats' have been experienced since his last visit. He was previously recommended to wear a heart monitor, which he did, and sent it back, but he has not received the results.  He mentions some swelling in his legs but states it does not bother him.  He lives independently, cooks for himself, and drives. He is active and functional at 78 years old, expressing a desire to continue living independently. He has a home blood pressure monitor and reports that his primary care provider has confirmed his blood pressure is well-managed at home.  Discussed the use of AI scribe software for clinical note transcription with the patient, who gave verbal consent to proceed.   Relevant histories: .  Social- former [employer]	[place] Patient William [name]) ROS: As per HPI.   Studies  Reviewed: .   Cardiac Studies & Procedures   ______________________________________________________________________________________________   STRESS TESTS  NM MYOCAR MULTI W/SPECT W 05/12/2016  Narrative  Defect 1: There is a small defect of moderate severity present in the basal inferolateral and mid inferolateral location.  Findings consistent with ischemia.  This is an intermediate risk study.  The left ventricular ejection fraction is normal (55-65%).  There was no ST segment deviation noted during stress.  Small area of mild to mod inferolateral ischemia.   ECHOCARDIOGRAM  ECHOCARDIOGRAM COMPLETE 05/12/2016  Narrative *[facility]* [address] [place], [place] [address] [phone]  ------------------------------------------------------------------- Transthoracic Echocardiography  Patient:    William [name], William [name] MR #:       [id number] Study Date: 05/12/2016 Gender:     M Age:        70 Height:     177.8 cm Weight:     117.2 kg BSA:        2.45 m^2 Pt. Status: Room:       240A  ADMITTING    Lennie Odor REFERRING    Marguarite Arbour SONOGRAPHER  [facility] PERFORMING   [place] ATTENDING    [place]  cc:  ------------------------------------------------------------------- LV EF: 60% -   65%  ------------------------------------------------------------------- Indications:      Chest pain 786.51.  ------------------------------------------------------------------- History:   PMH:   Dyspnea.  Risk factors:  Dyslipidemia.  ------------------------------------------------------------------- Study Conclusions  -  Left ventricle: Systolic function was normal. The estimated ejection fraction was in the range of 60% to 65%. Hypokinesis of the inferolateral myocardium.  ------------------------------------------------------------------- Study data:   Study status:  Routine.   Procedure:  Transthoracic echocardiography. Image quality was adequate. Transthoracic echocardiography.  M-mode, complete 2D, spectral Doppler, and color Doppler.  Birthdate:  Patient birthdate: 1945/04/16.  Age:  Patient is 78 yr old.  Sex:  Gender: male. BMI: 37.1 kg/m^2.  Patient status:  Inpatient.  Study date:  Study date: 05/12/2016. Study time: 07:41 PM.  Location:  Echo laboratory.  -------------------------------------------------------------------  ------------------------------------------------------------------- Left ventricle:  Systolic function was normal. The estimated ejection fraction was in the range of 60% to 65%.  Regional wall motion abnormalities:   Hypokinesis of the inferolateral myocardium.  ------------------------------------------------------------------- Aortic valve:   Trileaflet.  Doppler:   There was no stenosis. There was no significant regurgitation.  ------------------------------------------------------------------- Mitral valve:   Doppler:  There was trivial regurgitation.    Valve area by pressure half-time: 4.4 cm^2. Indexed valve area by pressure half-time: 1.79 cm^2/m^2.    Peak gradient (D): 2 mm Hg.  ------------------------------------------------------------------- Left atrium:  The atrium was at the upper limits of normal in size.  ------------------------------------------------------------------- Right ventricle:  The cavity size was normal.  ------------------------------------------------------------------- Tricuspid valve:   Doppler:  There was mild regurgitation.  ------------------------------------------------------------------- Right atrium:  The atrium was at the upper limits of normal in size.  ------------------------------------------------------------------- Measurements  Left ventricle                         Value          Reference LV ID, ED, PLAX chordal        (L)     40.6  mm       43 - 52 LV ID, ES, PLAX  chordal                24.2  mm       23 - 38 LV fx shortening, PLAX chordal         40    %        >=29 LV PW thickness, ED                    12    mm       ---------- IVS/LV PW ratio, ED                    1.2            <=1.3 LV e&', lateral                         9.79  cm/s     ---------- LV E/e&', lateral                       7.44           ---------- LV e&', medial                          6.31  cm/s     ---------- LV E/e&', medial                        11.54          ---------- LV e&', average  8.05  cm/s     ---------- LV E/e&', average                       9.04           ----------  Ventricular septum                     Value          Reference IVS thickness, ED                      14.4  mm       ----------  Aorta                                  Value          Reference Aortic root ID, ED                     36    mm       ----------  Left atrium                            Value          Reference LA ID, A-P, ES                         45    mm       ---------- LA ID/bsa, A-P                         1.84  cm/m^2   <=2.2 LA volume, ES, 1-p A4C                 50.9  ml       ---------- LA volume/bsa, ES, 1-p A4C             20.8  ml/m^2   ---------- LA volume, ES, 1-p A2C                 63.4  ml       ---------- LA volume/bsa, ES, 1-p A2C             25.9  ml/m^2   ----------  Mitral valve                           Value          Reference Mitral E-wave peak velocity            72.8  cm/s     ---------- Mitral A-wave peak velocity            64.3  cm/s     ---------- Mitral deceleration time               169   ms       150 - 230 Mitral pressure half-time              50    ms       ---------- Mitral peak gradient, D                2     mm Hg    ---------- Mitral E/A ratio, peak  1.1            ---------- Mitral valve area, PHT, DP             4.4   cm^2     ---------- Mitral valve area/bsa, PHT, DP         1.79  cm^2/m^2  ----------  Right atrium                           Value          Reference RA ID, S-I, ES, A4C            (H)     64.2  mm       34 - 49 RA area, ES, A4C               (H)     23.9  cm^2     8.3 - 19.5 RA volume, ES, A/L                     69.8  ml       ---------- RA volume/bsa, ES, A/L                 28.5  ml/m^2   ----------  Systemic veins                         Value          Reference Estimated CVP                          3     mm Hg    ----------  Right ventricle                        Value          Reference TAPSE                                  16    mm       ---------- RV s&', lateral, S                      8.16  cm/s     ----------  Legend: (L)  and  (H)  mark values outside specified reference range.  ------------------------------------------------------------------- Prepared and Electronically Authenticated by  Harold Hedge, MD 2018-05-01T06:48:21          ______________________________________________________________________________________________      Physical Exam:    VS:  BP (!) 150/78 (BP Location: Left Arm)   Pulse 70   Ht 5\' 10"  (1.778 m)   Wt 122.5 kg   SpO2 98%   BMI 38.74 kg/m    Wt Readings from Last 3 Encounters:  04/24/23 122.5 kg  04/15/23 121.2 kg  01/12/23 119.9 kg    Gen: no distress, morbid obesity   Neck: No JVD Cardiac: No Rubs or Gallops, no murmur, RRR +2 radial pulses Respiratory: Clear to auscultation bilaterally, normal effort, normal  respiratory rate GI: Soft, nontender, non-distended  MS: New left leg edema;  moves all extremities Integument: Skin feels warm, sternotomy site clean Neuro:  At time of evaluation, alert and oriented to person/place/time/situation  Psych: Normal affect, patient feels well   ASSESSMENT AND PLAN: .    Coronary  Artery Disease Coronary artery disease with a bypass surgery in 1999. Asymptomatic with no angina or dyspnea. Cholesterol levels are well-controlled. Hypertension generally  well-managed with medication, though elevated today due to missed dose. Functional and independent in daily activities. Emphasized medication adherence and regular monitoring. Echocardiogram ordered to assess cardiac function, last performed in 2018. If cardiac function is suboptimal, alternative management will be considered. - Continue current medications for coronary artery disease and hypertension. - Monitor blood pressure at home and ensure medication adherence. - Order echocardiogram to assess cardiac function. - Follow up with the team in one year unless symptoms change.  Hypertension Blood pressure elevated today, likely due to non-adherence to medication and fasting prior to appointment. Typically well-controlled with medication adherence. Regular home blood pressure monitoring in place. - Ensure medication adherence for hypertension. - Monitor blood pressure at home regularly.  Premature Ventricular Contractions (PVCs) PVCs, currently asymptomatic with no palpitations. Heart monitor results pending despite completion and return. No need to repeat monitor as asymptomatic. - Investigate status of heart monitor results with the team. - Do not repeat heart monitor at this time as asymptomatic and no ectopy on exam  Peripheral Edema Mild peripheral edema, a new finding since last visit. Denies associated symptoms such as angina or dyspnea. Monitoring for changes or new symptoms advised. - Monitor for any changes in edema or new symptoms. - Consider further evaluation if edema persists or worsens.  One year with me team  Riley Lam, MD FASE [facility] Cardiologist [facility]  [address] [place], [address] [place], [place] [address] [phone]  10:18 AM

## 2023-04-24 NOTE — Patient Instructions (Signed)
 Medication Instructions:  Your physician recommends that you continue on your current medications as directed. Please refer to the Current Medication list given to you today.  *If you need a refill on your cardiac medications before your next appointment, please call your pharmacy*   Lab Work: NONE If you have labs (blood work) drawn today and your tests are completely normal, you will receive your results only by: MyChart Message (if you have MyChart) OR A paper copy in the mail If you have any lab test that is abnormal or we need to change your treatment, we will call you to review the results.   Testing/Procedures: NONE   Follow-Up: At [facility], you and your health needs are our priority.  As part of our continuing mission to provide you with exceptional heart care, we have created designated Provider Care Teams.  These Care Teams include your primary Cardiologist (physician) and Advanced Practice Providers (APPs -  Physician Assistants and Nurse Practitioners) who all work together to provide you with the care you need, when you need it.  We recommend signing up for the patient portal called "MyChart".  Sign up information is provided on this After Visit Summary.  MyChart is used to connect with patients for Virtual Visits (Telemedicine).  Patients are able to view lab/test results, encounter notes, upcoming appointments, etc.  Non-urgent messages can be sent to your provider as well.   To learn more about what you can do with MyChart, go to [email or web address].    Your next appointment:   1 year(s)  Provider:   Riley Lam, MD      1st Floor: - Lobby - Registration  - Pharmacy  - Lab - Cafe  2nd Floor: - PV Lab - Diagnostic Testing (echo, CT, nuclear med)  3rd Floor: - Vacant  4th Floor: - TCTS (cardiothoracic surgery) - AFib Clinic - Structural Heart Clinic - Vascular Surgery  - Vascular Ultrasound  5th Floor: - HeartCare  Cardiology (general and EP) - Clinical Pharmacy for coumadin, hypertension, lipid, weight-loss medications, and med management appointments    Valet parking services will be available as well.

## 2023-07-29 DIAGNOSIS — Z961 Presence of intraocular lens: Secondary | ICD-10-CM | POA: Diagnosis not present

## 2023-10-15 ENCOUNTER — Encounter: Payer: Self-pay | Admitting: Family Medicine

## 2023-10-15 ENCOUNTER — Ambulatory Visit: Admitting: Family Medicine

## 2023-10-15 VITALS — BP 126/72 | HR 69 | Ht 70.0 in | Wt 255.1 lb

## 2023-10-15 DIAGNOSIS — H5461 Unqualified visual loss, right eye, normal vision left eye: Secondary | ICD-10-CM

## 2023-10-15 DIAGNOSIS — I2510 Atherosclerotic heart disease of native coronary artery without angina pectoris: Secondary | ICD-10-CM

## 2023-10-15 DIAGNOSIS — Z0000 Encounter for general adult medical examination without abnormal findings: Secondary | ICD-10-CM

## 2023-10-15 DIAGNOSIS — E66812 Obesity, class 2: Secondary | ICD-10-CM

## 2023-10-15 DIAGNOSIS — M7582 Other shoulder lesions, left shoulder: Secondary | ICD-10-CM

## 2023-10-15 DIAGNOSIS — R7303 Prediabetes: Secondary | ICD-10-CM

## 2023-10-15 DIAGNOSIS — E782 Mixed hyperlipidemia: Secondary | ICD-10-CM

## 2023-10-15 DIAGNOSIS — D509 Iron deficiency anemia, unspecified: Secondary | ICD-10-CM

## 2023-10-15 DIAGNOSIS — R630 Anorexia: Secondary | ICD-10-CM

## 2023-10-15 DIAGNOSIS — E6609 Other obesity due to excess calories: Secondary | ICD-10-CM

## 2023-10-15 MED ORDER — EZETIMIBE 10 MG PO TABS: 10.0000 mg | ORAL_TABLET | Freq: Every day | ORAL | 3 refills | Status: AC

## 2023-10-15 NOTE — Patient Instructions (Signed)
 It was nice to see you today,  We addressed the following topics today: -I went over some lab tests and my related results when I get them - I am sending in a prescription for your Zetia  to the [facility] pharmacy - I would like you to go to [facility] to let them know you want to make an appointment for your left shoulder pain.  I think it is rotator cuff tendinitis.  Have a great day,  Rolan Slain, MD

## 2023-10-15 NOTE — Assessment & Plan Note (Signed)
 Likely rotator cuff injury/tendinopathy based on history and exam findings. History of prior sports participation. Pain is elicited with specific movements consistent with rotator cuff pathology. - Advised on conservative management including heat, topical analgesics, and Tylenol . - Referral placed to [facility] for further evaluation, including consideration for imaging and/or injection. - Advised can call to schedule an appointment as an established patient.

## 2023-10-15 NOTE — Assessment & Plan Note (Signed)
 Poor Appetite and Weight Loss Unintentional weight loss of 12 lbs since 04/2023 associated with early satiety. History of iron  deficiency anemia secondary to a repaired small intestine tear. - Labs ordered to re-evaluate iron  levels and investigate other potential causes.

## 2023-10-15 NOTE — Progress Notes (Signed)
 Established Patient Office Visit  Subjective   Patient ID: William [name]., male    DOB: 09/28/1945  Age: 78 y.o. MRN: [id number]  Chief Complaint  Patient presents with   Medical Management of Chronic Issues    HPI    Subjective - Reports left shoulder pain. Is left-handed. Pain occurs with movement, especially abduction. Denies pain with palpation over the joint but notes pain deep in the bone. Reports some relief with 500 mg of an unspecified oral medication and topical creams. - Reports poor appetite and early satiety. Feels full quickly and is unable to finish meals. Denies nausea, constipation, or diarrhea. Has lost approximately 12 pounds since 04/2023. Is intentionally eating one meal a day to lose weight. - History of thumb pain, uses a wrist splint at times.  Medications: Ferrous sulfate  65 mg daily, requests refill for Zetia . Has been out of Zetia  for some time. No other medication changes reported.  PMH, PSH, FH, Social Hx: PMHx: Iron  deficiency anemia with history of blood loss from a small intestine tear (repaired at [facility]), cataracts (s/p surgery by Dr. Lorenda), hip pain (previously followed by orthopedics). History of playing football and baseball in high school. Social Hx: Reports decreased activity. Age 22.  ROS: Constitutional: Reports unintentional weight loss and poor appetite. Denies nausea. GI: Denies constipation or diarrhea. MSK: Reports left shoulder pain and thumb pain.   The ASCVD Risk score (Arnett DK, et al., 2019) failed to calculate for the following reasons:   Risk score cannot be calculated because patient has a medical history suggesting prior/existing ASCVD  Health Maintenance Due  Topic Date Due   Medicare Annual Wellness (AWV)  Never done   Hepatitis C Screening  Never done   Zoster Vaccines- Shingrix (1 of 2) 12/10/1993   Colonoscopy  05/13/2021   COVID-19 Vaccine (4 - 2025-26 season) 09/14/2023      Objective:     BP  126/72   Pulse 69   Ht 5' 10 (1.778 m)   Wt 255 lb 1.9 oz (115.7 kg)   SpO2 99%   BMI 36.61 kg/m    Physical Exam Gen: alert, oriented Heent: perrla, eomi.  mmm Cv: rrr, no murmur Pulm: no respiratory distress MSK: Left shoulder exam reveals pain with resisted abduction (Jobe's test), resisted external rotation, and passive internal rotation and adduction across the chest. No pain with resisted internal rotation. Full active range of motion, though painful. No tenderness to palpation over the joint. Ext: no pedal edema Psych: pleasant affect       Assessment & Plan:   Physical exam, annual  Tendinitis of left rotator cuff Assessment & Plan: Likely rotator cuff injury/tendinopathy based on history and exam findings. History of prior sports participation. Pain is elicited with specific movements consistent with rotator cuff pathology. - Advised on conservative management including heat, topical analgesics, and Tylenol . - Referral placed to [facility] for further evaluation, including consideration for imaging and/or injection. - Advised can call to schedule an appointment as an established patient.     Iron  deficiency anemia, unspecified iron  deficiency anemia type -     CBC with Differential/Platelet -     Iron , TIBC and Ferritin Panel  HYPERLIPIDEMIA TYPE IIB / III Assessment & Plan: - Zetia  refilled and sent to [facility].  Orders: -     Lipid panel  Class 2 obesity due to excess calories without serious comorbidity in adult, unspecified BMI -     TSH  Prediabetes -     Hemoglobin A1c  Right eye blindness of unknown category, unspecified left eye visual impairment category Assessment & Plan: Patient is blind in the right eye. Follows with Dr. Lorenda for eye care, last seen within the year, next appointment scheduled in one year. Has a prescription for new glasses.   Coronary artery disease involving native coronary artery of native  heart without angina pectoris  Poor appetite Assessment & Plan: Poor Appetite and Weight Loss Unintentional weight loss of 12 lbs since 04/2023 associated with early satiety. History of iron  deficiency anemia secondary to a repaired small intestine tear. - Labs ordered to re-evaluate iron  levels and investigate other potential causes.   Other orders -     Ezetimibe ; Take 1 tablet (10 mg total) by mouth daily.  Dispense: 90 tablet; Refill: 3 -     Comprehensive metabolic panel with GFR -     Specimen status report     Return in about 6 months (around 04/14/2024) for hld.    Toribio MARLA Slain, MD

## 2023-10-15 NOTE — Assessment & Plan Note (Signed)
-   Zetia  refilled and sent to [facility].

## 2023-10-15 NOTE — Assessment & Plan Note (Signed)
 Patient is blind in the right eye. Follows with Dr. Lorenda for eye care, last seen within the year, next appointment scheduled in one year. Has a prescription for new glasses.

## 2023-10-16 ENCOUNTER — Ambulatory Visit: Payer: Self-pay | Admitting: Family Medicine

## 2023-10-16 DIAGNOSIS — I25709 Atherosclerosis of coronary artery bypass graft(s), unspecified, with unspecified angina pectoris: Secondary | ICD-10-CM

## 2023-10-16 DIAGNOSIS — E782 Mixed hyperlipidemia: Secondary | ICD-10-CM

## 2023-10-16 DIAGNOSIS — M67912 Unspecified disorder of synovium and tendon, left shoulder: Secondary | ICD-10-CM | POA: Diagnosis not present

## 2023-10-16 LAB — IRON,TIBC AND FERRITIN PANEL
Ferritin: 119 ng/mL (ref 30–400)
Iron Saturation: 36 % (ref 15–55)
Iron: 98 ug/dL (ref 38–169)
Total Iron Binding Capacity: 272 ug/dL (ref 250–450)
UIBC: 174 ug/dL (ref 111–343)

## 2023-10-16 LAB — CBC WITH DIFFERENTIAL/PLATELET
Basophils Absolute: 0 x10E3/uL (ref 0.0–0.2)
Basos: 1 %
EOS (ABSOLUTE): 0.2 x10E3/uL (ref 0.0–0.4)
Eos: 4 %
Hematocrit: 39.9 % (ref 37.5–51.0)
Hemoglobin: 13.3 g/dL (ref 13.0–17.7)
Immature Grans (Abs): 0 x10E3/uL (ref 0.0–0.1)
Immature Granulocytes: 0 %
Lymphocytes Absolute: 1.6 x10E3/uL (ref 0.7–3.1)
Lymphs: 39 %
MCH: 29.3 pg (ref 26.6–33.0)
MCHC: 33.3 g/dL (ref 31.5–35.7)
MCV: 88 fL (ref 79–97)
Monocytes Absolute: 0.4 x10E3/uL (ref 0.1–0.9)
Monocytes: 11 %
Neutrophils Absolute: 1.9 x10E3/uL (ref 1.4–7.0)
Neutrophils: 45 %
Platelets: 160 x10E3/uL (ref 150–450)
RBC: 4.54 x10E6/uL (ref 4.14–5.80)
RDW: 13.2 % (ref 11.6–15.4)
WBC: 4.2 x10E3/uL (ref 3.4–10.8)

## 2023-10-16 LAB — LIPID PANEL
Chol/HDL Ratio: 3.3 ratio (ref 0.0–5.0)
Cholesterol, Total: 129 mg/dL (ref 100–199)
HDL: 39 mg/dL — ABNORMAL LOW (ref >39)
LDL Chol Calc (NIH): 73 mg/dL (ref 0–99)
Triglycerides: 88 mg/dL (ref 0–149)
VLDL Cholesterol Cal: 17 mg/dL (ref 5–40)

## 2023-10-16 LAB — HEMOGLOBIN A1C
Est. average glucose Bld gHb Est-mCnc: 117 mg/dL
Hgb A1c MFr Bld: 5.7 % — ABNORMAL HIGH (ref 4.8–5.6)

## 2023-10-16 LAB — TSH: TSH: 1.12 u[IU]/mL (ref 0.450–4.500)

## 2023-10-16 NOTE — Progress Notes (Signed)
 Called [facility] they are advised of the CMP to be added

## 2023-10-17 LAB — COMPREHENSIVE METABOLIC PANEL WITH GFR
ALT: 31 IU/L (ref 0–44)
AST: 34 IU/L (ref 0–40)
Albumin: 3.7 g/dL — ABNORMAL LOW (ref 3.8–4.8)
Alkaline Phosphatase: 75 IU/L (ref 47–123)
BUN/Creatinine Ratio: 11 (ref 10–24)
BUN: 10 mg/dL (ref 8–27)
Bilirubin Total: 0.6 mg/dL (ref 0.0–1.2)
CO2: 22 mmol/L (ref 20–29)
Calcium: 9 mg/dL (ref 8.6–10.2)
Chloride: 103 mmol/L (ref 96–106)
Creatinine, Ser: 0.91 mg/dL (ref 0.76–1.27)
Globulin, Total: 3.4 g/dL (ref 1.5–4.5)
Glucose: 98 mg/dL (ref 70–99)
Potassium: 4.1 mmol/L (ref 3.5–5.2)
Sodium: 138 mmol/L (ref 134–144)
Total Protein: 7.1 g/dL (ref 6.0–8.5)
eGFR: 86 mL/min/1.73 (ref >59)

## 2023-10-17 LAB — SPECIMEN STATUS REPORT

## 2023-12-23 ENCOUNTER — Other Ambulatory Visit: Payer: Self-pay | Admitting: Family Medicine

## 2023-12-23 MED ORDER — ATORVASTATIN CALCIUM 80 MG PO TABS: 80.0000 mg | ORAL_TABLET | Freq: Every day | ORAL | 3 refills | Status: AC

## 2023-12-28 ENCOUNTER — Other Ambulatory Visit: Payer: Self-pay | Admitting: Family Medicine

## 2023-12-28 MED ORDER — PANTOPRAZOLE SODIUM 40 MG PO TBEC: 40.0000 mg | DELAYED_RELEASE_TABLET | Freq: Every day | ORAL | 1 refills | Status: AC

## 2024-04-15 ENCOUNTER — Ambulatory Visit: Admitting: Family Medicine

## 2024-04-18 ENCOUNTER — Ambulatory Visit: Admitting: Family Medicine
